# Patient Record
Sex: Female | Born: 1980 | Race: Black or African American | Hispanic: No | Marital: Married | Smoking: Current every day smoker
Health system: Southern US, Community
[De-identification: ages and names within clinical notes are randomized; demographics above are authoritative.]

## PROBLEM LIST (undated history)

## (undated) DIAGNOSIS — E039 Hypothyroidism, unspecified: Secondary | ICD-10-CM

## (undated) DIAGNOSIS — D500 Iron deficiency anemia secondary to blood loss (chronic): Secondary | ICD-10-CM

## (undated) DIAGNOSIS — K219 Gastro-esophageal reflux disease without esophagitis: Secondary | ICD-10-CM

## (undated) DIAGNOSIS — I10 Essential (primary) hypertension: Secondary | ICD-10-CM

## (undated) HISTORY — DX: Iron deficiency anemia secondary to blood loss (chronic): D50.0

---

## 2011-08-09 ENCOUNTER — Emergency Department (HOSPITAL_COMMUNITY): Payer: Self-pay

## 2011-08-09 ENCOUNTER — Emergency Department (HOSPITAL_COMMUNITY)
Admission: EM | Admit: 2011-08-09 | Discharge: 2011-08-09 | Disposition: A | Discharge status: Home / Self Care | Payer: Self-pay | Attending: Emergency Medicine | Admitting: Emergency Medicine

## 2011-08-09 DIAGNOSIS — R110 Nausea: Secondary | ICD-10-CM | POA: Insufficient documentation

## 2011-08-09 DIAGNOSIS — R51 Headache: Secondary | ICD-10-CM | POA: Insufficient documentation

## 2011-08-09 DIAGNOSIS — E039 Hypothyroidism, unspecified: Secondary | ICD-10-CM | POA: Insufficient documentation

## 2011-08-09 DIAGNOSIS — H53149 Visual discomfort, unspecified: Secondary | ICD-10-CM | POA: Insufficient documentation

## 2011-08-09 DIAGNOSIS — J3489 Other specified disorders of nose and nasal sinuses: Secondary | ICD-10-CM | POA: Insufficient documentation

## 2011-08-09 DIAGNOSIS — I10 Essential (primary) hypertension: Secondary | ICD-10-CM | POA: Insufficient documentation

## 2011-09-11 ENCOUNTER — Emergency Department (HOSPITAL_COMMUNITY)
Admission: EM | Admit: 2011-09-11 | Discharge: 2011-09-12 | Disposition: A | Discharge status: Home / Self Care | Payer: Self-pay | Attending: Emergency Medicine | Admitting: Emergency Medicine

## 2011-09-11 ENCOUNTER — Emergency Department (HOSPITAL_COMMUNITY): Payer: Self-pay

## 2011-09-11 DIAGNOSIS — K922 Gastrointestinal hemorrhage, unspecified: Secondary | ICD-10-CM | POA: Insufficient documentation

## 2011-09-11 DIAGNOSIS — R110 Nausea: Secondary | ICD-10-CM | POA: Insufficient documentation

## 2011-09-11 DIAGNOSIS — E039 Hypothyroidism, unspecified: Secondary | ICD-10-CM | POA: Insufficient documentation

## 2011-09-11 DIAGNOSIS — I10 Essential (primary) hypertension: Secondary | ICD-10-CM | POA: Insufficient documentation

## 2011-09-11 DIAGNOSIS — Z79899 Other long term (current) drug therapy: Secondary | ICD-10-CM | POA: Insufficient documentation

## 2011-09-11 DIAGNOSIS — R109 Unspecified abdominal pain: Secondary | ICD-10-CM | POA: Insufficient documentation

## 2011-09-11 DIAGNOSIS — K5730 Diverticulosis of large intestine without perforation or abscess without bleeding: Secondary | ICD-10-CM | POA: Insufficient documentation

## 2011-09-11 DIAGNOSIS — G43909 Migraine, unspecified, not intractable, without status migrainosus: Secondary | ICD-10-CM | POA: Insufficient documentation

## 2011-09-11 DIAGNOSIS — F17200 Nicotine dependence, unspecified, uncomplicated: Secondary | ICD-10-CM | POA: Insufficient documentation

## 2011-09-11 LAB — URINALYSIS, ROUTINE W REFLEX MICROSCOPIC
Bilirubin Urine: NEGATIVE
Glucose, UA: NEGATIVE mg/dL
Hgb urine dipstick: NEGATIVE
Ketones, ur: NEGATIVE mg/dL
Leukocytes, UA: NEGATIVE
Nitrite: NEGATIVE
Protein, ur: NEGATIVE mg/dL
Specific Gravity, Urine: 1.024 (ref 1.005–1.030)
Urobilinogen, UA: 0.2 mg/dL (ref 0.0–1.0)
pH: 5.5 (ref 5.0–8.0)

## 2011-09-11 LAB — POCT I-STAT, CHEM 8
BUN: 13 mg/dL (ref 6–23)
Calcium, Ion: 1.16 mmol/L (ref 1.12–1.32)
Chloride: 100 mEq/L (ref 96–112)
Creatinine, Ser: 0.9 mg/dL (ref 0.50–1.10)
Glucose, Bld: 94 mg/dL (ref 70–99)
HCT: 45 % (ref 36.0–46.0)
Hemoglobin: 15.3 g/dL — ABNORMAL HIGH (ref 12.0–15.0)
Potassium: 3.3 mEq/L — ABNORMAL LOW (ref 3.5–5.1)
Sodium: 137 mEq/L (ref 135–145)
TCO2: 26 mmol/L (ref 0–100)

## 2011-09-11 LAB — CBC
HCT: 39.3 % (ref 36.0–46.0)
Hemoglobin: 13.9 g/dL (ref 12.0–15.0)
MCH: 27.2 pg (ref 26.0–34.0)
MCHC: 35.4 g/dL (ref 30.0–36.0)
MCV: 76.9 fL — ABNORMAL LOW (ref 78.0–100.0)
Platelets: 299 10*3/uL (ref 150–400)
RBC: 5.11 MIL/uL (ref 3.87–5.11)
RDW: 13.9 % (ref 11.5–15.5)
WBC: 11.5 10*3/uL — ABNORMAL HIGH (ref 4.0–10.5)

## 2011-09-11 LAB — DIFFERENTIAL
Basophils Absolute: 0 10*3/uL (ref 0.0–0.1)
Basophils Relative: 0 % (ref 0–1)
Eosinophils Absolute: 0.2 10*3/uL (ref 0.0–0.7)
Eosinophils Relative: 1 % (ref 0–5)
Lymphocytes Relative: 46 % (ref 12–46)
Lymphs Abs: 5.3 10*3/uL — ABNORMAL HIGH (ref 0.7–4.0)
Monocytes Absolute: 0.7 10*3/uL (ref 0.1–1.0)
Monocytes Relative: 6 % (ref 3–12)
Neutro Abs: 5.3 10*3/uL (ref 1.7–7.7)
Neutrophils Relative %: 46 % (ref 43–77)

## 2011-09-11 LAB — OCCULT BLOOD, POC DEVICE: Fecal Occult Bld: NEGATIVE

## 2011-09-11 LAB — PREGNANCY, URINE: Preg Test, Ur: NEGATIVE

## 2011-09-12 MED ORDER — IOHEXOL 300 MG/ML  SOLN: 100.0000 mL | Freq: Once | INTRAMUSCULAR | Status: DC | PRN

## 2012-03-08 ENCOUNTER — Encounter (HOSPITAL_COMMUNITY): Payer: Self-pay | Admitting: Emergency Medicine

## 2012-03-08 ENCOUNTER — Emergency Department (HOSPITAL_COMMUNITY)
Admission: EM | Admit: 2012-03-08 | Discharge: 2012-03-09 | Disposition: A | Discharge status: Home / Self Care | Payer: Self-pay | Attending: Emergency Medicine | Admitting: Emergency Medicine

## 2012-03-08 ENCOUNTER — Emergency Department (HOSPITAL_COMMUNITY): Payer: Self-pay

## 2012-03-08 DIAGNOSIS — F17200 Nicotine dependence, unspecified, uncomplicated: Secondary | ICD-10-CM | POA: Insufficient documentation

## 2012-03-08 DIAGNOSIS — M79642 Pain in left hand: Secondary | ICD-10-CM

## 2012-03-08 DIAGNOSIS — I10 Essential (primary) hypertension: Secondary | ICD-10-CM | POA: Insufficient documentation

## 2012-03-08 DIAGNOSIS — M79609 Pain in unspecified limb: Secondary | ICD-10-CM | POA: Insufficient documentation

## 2012-03-08 HISTORY — DX: Essential (primary) hypertension: I10

## 2012-03-08 NOTE — ED Notes (Signed)
PT. REPORTS LEFT HAND PAIN WITH SWELLING ONSET YESTERDAY , DENIES INJURY OR FALL . 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

## 2012-03-09 ENCOUNTER — Encounter (HOSPITAL_COMMUNITY): Payer: Self-pay | Admitting: *Deleted

## 2012-03-09 MED ORDER — IBUPROFEN 800 MG PO TABS
800.0000 mg | ORAL_TABLET | Freq: Once | ORAL | Status: AC
  Administered 2012-03-09: 800 mg via ORAL
  Filled 2012-03-09: qty 1

## 2012-03-09 MED ORDER — BUTALBITAL-APAP-CAFFEINE 50-325-40 MG PO TABS: 1.0000 | ORAL_TABLET | Freq: Four times a day (QID) | ORAL | Status: DC | PRN

## 2012-03-09 NOTE — ED Notes (Signed)
Pt c/o pain and swelling to left hand since yesterday. 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 Denies known injury.

## 2012-03-09 NOTE — Discharge Instructions (Signed)
Rest, wear splint, and take medications as needed and as prescribed. Call your physician in [place] for followup in the clinic this week for recheck and further evaluation. Followup with orthopedic surgeon for persistent symptoms. Return here Her be evaluated sooner if you develop a fever or worsening condition despite medication and treatments.

## 2012-03-09 NOTE — ED Provider Notes (Signed)
History     CSN: [id number]  Arrival date & time 03/08/12  2309   First MD Initiated Contact with Patient 03/09/12 0040      Chief Complaint  Patient presents with  . Hand Pain    (Consider location/radiation/quality/duration/timing/severity/associated sxs/prior treatment) Patient is a 31 y.o. female presenting with hand pain. The history is provided by the patient.  Hand Pain This is a new problem. The current episode started 2 days ago. The problem occurs constantly. The problem has not changed since onset.Pertinent negatives include no chest pain, no headaches and no shortness of breath. Exacerbated by: Any kind of movement or activity. The symptoms are relieved by rest. She has tried nothing for the symptoms.   patient started a new job last week where she uses her hands and involves a lot of repetitive motion. She is right-handed and has no right hand pains or discomfort. Her left hand began bothering her a few days ago and is worse at work. She feels like it is swollen. No direct trauma. No rash. No skin breaks. No wrist pain. She does get some tingling in her fingers with bending her wrist. No fevers. No vomiting. No history of same. No recent illness otherwise. Moderate in severity. Pain described as soreness and there is no radiation. No weakness.  Past Medical History  Diagnosis Date  . Hypertension     History reviewed. No pertinent past surgical history.  History reviewed. No pertinent family history.  History  Substance Use Topics  . Smoking status: Current Everyday Smoker  . Smokeless tobacco: Not on file  . Alcohol Use: Yes    OB History    Grav Para Term Preterm Abortions TAB SAB Ect Mult Living                  Review of Systems  Constitutional: Negative for fever and chills.  HENT: Negative for neck pain and neck stiffness.   Eyes: Negative for pain.  Respiratory: Negative for shortness of breath.   Cardiovascular: Negative for chest pain.    Gastrointestinal: Negative for vomiting.  Genitourinary: Negative for dysuria.  Musculoskeletal: Negative for back pain.  Skin: Negative for rash and wound.  Neurological: Negative for headaches.  All other systems reviewed and are negative.    Allergies  Celebrex and Lisinopril  Home Medications   Current Outpatient Rx  Name Route Sig Dispense Refill  . EXCEDRIN PO Oral Take 2 tablets by mouth once.    Marland Kitchen HYDROCHLOROTHIAZIDE 12.5 MG PO CAPS Oral Take 12.5 mg by mouth every morning.    Marland Kitchen LEVOTHYROXINE SODIUM 100 MCG PO TABS Oral Take 100 mcg by mouth every morning.      BP 123/89  Pulse 72  Temp(Src) 97.4 F (36.3 C) (Oral)  Resp 18  SpO2 98%  LMP 03/02/2012  Physical Exam  Constitutional: She is oriented to person, place, and time. She appears well-developed and well-nourished.  HENT:  Head: Normocephalic and atraumatic.  Eyes: Conjunctivae and EOM are normal. Pupils are equal, round, and reactive to light.  Neck: Trachea normal. Neck supple. No thyromegaly present.  Cardiovascular: Normal rate, regular rhythm, S1 normal, S2 normal and normal pulses.     No systolic murmur is present   No diastolic murmur is present  Pulses:      Radial pulses are 2+ on the right side, and 2+ on the left side.  Pulmonary/Chest: Effort normal and breath sounds normal. She has no wheezes. She has no rhonchi. She  has no rales. She exhibits no tenderness.  Abdominal: Normal appearance. There is no CVA tenderness and negative Murphy's sign.  Musculoskeletal:       Left upper extremity: Localized hand discomfort she will palmar and dorsal aspect. There is no wrist tenderness with full range of motion. No anatomical snuffbox tenderness. No appreciable swelling. Positive Tinel's. No erythema or increased warmth to touch. Distal neurovascular intact with good cap refill. No skin breaks.  Neurological: She is alert and oriented to person, place, and time. She has normal strength. No cranial nerve  deficit or sensory deficit. GCS eye subscore is 4. GCS verbal subscore is 5. GCS motor subscore is 6.  Skin: Skin is warm and dry. No rash noted. She is not diaphoretic.  Psychiatric: Her speech is normal.       Cooperative and appropriate    ED Course  Procedures (including critical care time)  Labs Reviewed - No data to display Dg Hand Complete Left  03/08/2012  *RADIOLOGY REPORT*  Clinical Data: Left lateral hand swelling and pain, radiating to the first digit.  LEFT HAND - COMPLETE 3+ VIEW  Comparison: None.  Findings: There is no evidence of fracture or dislocation.  The joint spaces are preserved; the soft tissues are unremarkable in appearance.  The carpal rows are intact, and demonstrate normal alignment.  IMPRESSION: No evidence of fracture or dislocation.  Original Report Authenticated By: Tonia Ghent, M.D.    Gustavus Bryant. Splint. Pain medications.   MDM   Hand pain and possible carpal tunnel.  No evidence of joint infection, abscess or cellulitis otherwise. Plan NSAIDs and primary care followup. Orthopedics referral also provided, the patient lives in [place] prefers to see someone there.  Left wrist splint provided with prescriptions. Reliable historian verbalizes understanding hand pain precautions. She is stable for discharge home at this time. X-ray obtained and reviewed as above.        Sunnie Nielsen, MD 03/09/12 [phone]

## 2012-04-01 ENCOUNTER — Encounter (HOSPITAL_COMMUNITY): Payer: Self-pay | Admitting: Emergency Medicine

## 2012-04-01 ENCOUNTER — Emergency Department (HOSPITAL_COMMUNITY)
Admission: EM | Admit: 2012-04-01 | Discharge: 2012-04-01 | Disposition: A | Discharge status: Home / Self Care | Payer: Self-pay | Attending: Emergency Medicine | Admitting: Emergency Medicine

## 2012-04-01 DIAGNOSIS — M542 Cervicalgia: Secondary | ICD-10-CM | POA: Insufficient documentation

## 2012-04-01 DIAGNOSIS — I10 Essential (primary) hypertension: Secondary | ICD-10-CM | POA: Insufficient documentation

## 2012-04-01 DIAGNOSIS — E039 Hypothyroidism, unspecified: Secondary | ICD-10-CM | POA: Insufficient documentation

## 2012-04-01 DIAGNOSIS — F17200 Nicotine dependence, unspecified, uncomplicated: Secondary | ICD-10-CM | POA: Insufficient documentation

## 2012-04-01 DIAGNOSIS — M62838 Other muscle spasm: Secondary | ICD-10-CM

## 2012-04-01 HISTORY — DX: Hypothyroidism, unspecified: E03.9

## 2012-04-01 MED ORDER — HYDROCODONE-ACETAMINOPHEN 5-325 MG PO TABS: ORAL_TABLET | ORAL | Status: AC

## 2012-04-01 MED ORDER — METAXALONE 800 MG PO TABS: 800.0000 mg | ORAL_TABLET | Freq: Three times a day (TID) | ORAL | Status: AC

## 2012-04-01 NOTE — ED Provider Notes (Signed)
History     CSN: [id number]  Arrival date & time 04/01/12  1757   First MD Initiated Contact with Patient 04/01/12 1828      Chief Complaint  Patient presents with  . Neck Pain    HPI Pt was seen at 1835.  Per pt, c/o gradual onset and persistence of constant R>L neck "pain" since last night.  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  Denies injury, no tingling/numbness in extremities, no focal motor weakness, no fevers, no rash.     Past Medical History  Diagnosis Date  . Hypertension   . Hypothyroid     History reviewed. No pertinent past surgical history.   History  Substance Use Topics  . Smoking status: Current Everyday Smoker  . Smokeless tobacco: Not on file  . Alcohol Use: Yes    Review of Systems ROS: Statement: All systems negative except as marked or noted in the HPI; Constitutional: Negative for fever and chills. ; ; Eyes: Negative for eye pain, redness and discharge. ; ; ENMT: Negative for ear pain, hoarseness, nasal congestion, sinus pressure and sore throat. ; ; Cardiovascular: Negative for chest pain, palpitations, diaphoresis, dyspnea and peripheral edema. ; ; Respiratory: Negative for cough, wheezing and stridor. ; ; Gastrointestinal: Negative for nausea, vomiting, diarrhea, abdominal pain, blood in stool, hematemesis, jaundice and rectal bleeding. . ; ; Genitourinary: Negative for dysuria, flank pain and hematuria. ; ; Musculoskeletal: Negative for back pain and +neck pain. Negative for swelling and trauma.; ; Skin: Negative for pruritus, rash, abrasions, blisters, bruising and skin lesion.; ; Neuro: Negative for headache, lightheadedness and neck stiffness. Negative for weakness, altered level of consciousness , altered mental status, extremity weakness, paresthesias, involuntary movement, seizure and syncope.      Allergies  Celebrex and Lisinopril  Home Medications   Current Outpatient Rx  Name Route Sig  Dispense Refill  . HYDROCHLOROTHIAZIDE 12.5 MG PO CAPS Oral Take 12.5 mg by mouth every morning.    Marland Kitchen LEVOTHYROXINE SODIUM 100 MCG PO TABS Oral Take 100 mcg by mouth every morning.      BP 129/78  Pulse 90  Temp(Src) 97.8 F (36.6 C) (Oral)  Resp 16  SpO2 100%  LMP 03/02/2012  Physical Exam 1840: Physical examination:  Nursing notes reviewed; Vital signs and O2 SAT reviewed;  Constitutional: Well developed, Well nourished, Well hydrated, In no acute distress; Head:  Normocephalic, atraumatic; Eyes: EOMI, PERRL, No scleral icterus; ENMT: Mouth and pharynx normal, Mucous membranes moist; Neck: Supple, Full range of motion, No lymphadenopathy; Cardiovascular: Regular rate and rhythm, No murmur, rub, or gallop; Respiratory: Breath sounds clear & equal bilaterally, No rales, rhonchi, wheezes, or rub, Normal respiratory effort/excursion; Chest: Nontender, Movement normal; Spine:  No midline CS, TS, LS tenderness.  +TTP R>L hypertonic trapezius muscles which reproduces pt's pain.; Genitourinary: No CVA tenderness; Extremities: Pulses normal, No tenderness, No edema, No calf edema or asymmetry.; Neuro: AA&Ox3, Major CN grossly intact. Speech clear, no facial droop, gait steady. No gross focal motor or sensory deficits in extremities.; Skin: Color normal, Warm, Dry, no rash.    ED Course  Procedures   MDM  MDM Reviewed: nursing note and vitals       6:35 PM:  No midline CS tenderness, no neuro deficits.  Pain is reproducible when pt demonstrates the movements she performs at work.  Appears msk pain at this time, will tx symptomatically.       Laray Anger,  DO 04/01/12 2311

## 2012-04-01 NOTE — ED Notes (Signed)
C/o neck pain since last night.  No known injury.

## 2012-04-01 NOTE — Discharge Instructions (Signed)
RESOURCE GUIDE  Dental Problems  Patients with Medicaid: [facility]                     [facility] [address]                                           [address] Phone:  [phone]                                                  Phone:  [phone]  If unable to pay or uninsured, contact:  [facility] or [facility] to become qualified for the adult dental clinic.  Chronic Pain Problems Contact [facility]  [phone] Patients need to be referred by their primary care doctor.  Insufficient Money for Medicine Contact [employer]:  call "211" or [facility] 271-5999.  No Primary Care Doctor Call [facility]  [phone] Other agencies that provide inexpensive medical care    [facility]  [phone]    [facility]  [phone]    [facility]  [phone]    Women's Clinic  [phone]    [facility]  [phone]    [facility]  [phone]  Psychological Services [facility]  [phone] [facility]  [phone] [facility]   [phone] (emergency services [phone])  Substance Abuse Resources [facility]  336-882-2125 [facility] 336-784-9470 [facility] 336-285-9073 [facility] 336-845-3988 Residential & Outpatient Substance Abuse Program  800-659-3381  Abuse/Neglect [place] Child Abuse Hotline (336) 641-3795 [place] Child Abuse Hotline 800-378-5315 (After Hours)  Emergency Shelter [facility] (336) 271-5985  Maternity Homes [facility] (336) 275-9566 [facility] (704) 372-4663  MRSA Hotline #:   832-7006    [place] Resources  [facility] [place]     [employer]                          [facility] [address] [place]                       [address]      [address]  [place]                                                [place]                            [place] Phone:  [phone]                                   Phone:  [phone]                 Phone:  [phone]  [facility] Phone:  [phone]    [facility] Child Abuse Hotline 617-541-8087 640-549-2249 (After Hours)   Take the prescriptions as directed.  Apply moist heat or ice to the area(s) of discomfort, for 15 minutes at a time, several times per day for the next few days.  Do not fall asleep on a heating or ice pack.  Call your regular medical doctor on Monday to schedule a follow up appointment within the next week.  Return to the Emergency Department immediately if worsening.

## 2015-08-29 ENCOUNTER — Encounter (HOSPITAL_COMMUNITY): Payer: Self-pay | Admitting: Emergency Medicine

## 2015-08-29 ENCOUNTER — Emergency Department (HOSPITAL_COMMUNITY): Admission: EM | Admit: 2015-08-29 | Discharge: 2015-08-29 | Discharge status: Absent without leave | Payer: Self-pay

## 2015-08-29 ENCOUNTER — Emergency Department (HOSPITAL_COMMUNITY)
Admission: EM | Admit: 2015-08-29 | Discharge: 2015-08-30 | Disposition: A | Discharge status: Home / Self Care | Payer: BLUE CROSS/BLUE SHIELD | Attending: Emergency Medicine | Admitting: Emergency Medicine

## 2015-08-29 DIAGNOSIS — I10 Essential (primary) hypertension: Secondary | ICD-10-CM | POA: Diagnosis not present

## 2015-08-29 DIAGNOSIS — R1012 Left upper quadrant pain: Secondary | ICD-10-CM | POA: Insufficient documentation

## 2015-08-29 DIAGNOSIS — Z720 Tobacco use: Secondary | ICD-10-CM | POA: Diagnosis not present

## 2015-08-29 DIAGNOSIS — K625 Hemorrhage of anus and rectum: Secondary | ICD-10-CM

## 2015-08-29 DIAGNOSIS — R109 Unspecified abdominal pain: Secondary | ICD-10-CM

## 2015-08-29 DIAGNOSIS — R1032 Left lower quadrant pain: Secondary | ICD-10-CM | POA: Diagnosis not present

## 2015-08-29 DIAGNOSIS — R102 Pelvic and perineal pain: Secondary | ICD-10-CM | POA: Diagnosis not present

## 2015-08-29 DIAGNOSIS — E039 Hypothyroidism, unspecified: Secondary | ICD-10-CM | POA: Diagnosis not present

## 2015-08-29 HISTORY — DX: Gastro-esophageal reflux disease without esophagitis: K21.9

## 2015-08-29 LAB — COMPREHENSIVE METABOLIC PANEL
ALT: 35 U/L (ref 14–54)
AST: 31 U/L (ref 15–41)
Albumin: 3.8 g/dL (ref 3.5–5.0)
Alkaline Phosphatase: 93 U/L (ref 38–126)
Anion gap: 7 (ref 5–15)
BUN: 14 mg/dL (ref 6–20)
CO2: 27 mmol/L (ref 22–32)
Calcium: 8.3 mg/dL — ABNORMAL LOW (ref 8.9–10.3)
Chloride: 98 mmol/L — ABNORMAL LOW (ref 101–111)
Creatinine, Ser: 0.78 mg/dL (ref 0.44–1.00)
GFR calc Af Amer: >60 mL/min (ref >60)
GFR calc non Af Amer: >60 mL/min (ref >60)
Glucose, Bld: 105 mg/dL — ABNORMAL HIGH (ref 65–99)
Potassium: 3.1 mmol/L — ABNORMAL LOW (ref 3.5–5.1)
Sodium: 132 mmol/L — ABNORMAL LOW (ref 135–145)
Total Bilirubin: 0.3 mg/dL (ref 0.3–1.2)
Total Protein: 8 g/dL (ref 6.5–8.1)

## 2015-08-29 LAB — CBC WITH DIFFERENTIAL/PLATELET
Basophils Absolute: 0 10*3/uL (ref 0.0–0.1)
Basophils Relative: 0 %
Eosinophils Absolute: 0.1 10*3/uL (ref 0.0–0.7)
Eosinophils Relative: 1 %
HCT: 36.4 % (ref 36.0–46.0)
Hemoglobin: 12.1 g/dL (ref 12.0–15.0)
Lymphocytes Relative: 39 %
Lymphs Abs: 3.4 10*3/uL (ref 0.7–4.0)
MCH: 25.8 pg — ABNORMAL LOW (ref 26.0–34.0)
MCHC: 33.2 g/dL (ref 30.0–36.0)
MCV: 77.6 fL — ABNORMAL LOW (ref 78.0–100.0)
Monocytes Absolute: 0.5 10*3/uL (ref 0.1–1.0)
Monocytes Relative: 6 %
Neutro Abs: 4.8 10*3/uL (ref 1.7–7.7)
Neutrophils Relative %: 54 %
Platelets: 340 10*3/uL (ref 150–400)
RBC: 4.69 MIL/uL (ref 3.87–5.11)
RDW: 14.9 % (ref 11.5–15.5)
WBC: 8.9 10*3/uL (ref 4.0–10.5)

## 2015-08-29 NOTE — ED Notes (Signed)
Ambulatory to restroom without difficulty to obtain ua sample.

## 2015-08-29 NOTE — ED Notes (Signed)
Pt reports blood in stools x 1 week. C/O back, pelvic and chest pain.

## 2015-08-29 NOTE — ED Notes (Signed)
Patient states that she is having som chest discomfort at this time. Patient placed on cardiac monitor. Patient states that she just started her period also.

## 2015-08-29 NOTE — ED Notes (Signed)
Pt eating a bag of potato chips, no distress noted,

## 2015-08-30 LAB — POC OCCULT BLOOD, ED: Fecal Occult Bld: POSITIVE — AB

## 2015-08-30 MED ORDER — HYDROCORTISONE ACETATE 25 MG RE SUPP: 25.0000 mg | Freq: Two times a day (BID) | RECTAL | Status: DC

## 2015-08-30 MED ORDER — DICYCLOMINE HCL 20 MG PO TABS: 20.0000 mg | ORAL_TABLET | Freq: Three times a day (TID) | ORAL | Status: DC

## 2015-08-30 MED ORDER — METRONIDAZOLE 500 MG PO TABS: 500.0000 mg | ORAL_TABLET | Freq: Four times a day (QID) | ORAL | Status: DC

## 2015-08-30 MED ORDER — CIPROFLOXACIN HCL 250 MG PO TABS
500.0000 mg | ORAL_TABLET | Freq: Once | ORAL | Status: AC
  Administered 2015-08-30: 500 mg via ORAL
  Filled 2015-08-30: qty 2

## 2015-08-30 MED ORDER — CIPROFLOXACIN HCL 500 MG PO TABS: 500.0000 mg | ORAL_TABLET | Freq: Two times a day (BID) | ORAL | Status: DC

## 2015-08-30 MED ORDER — METRONIDAZOLE 500 MG PO TABS
500.0000 mg | ORAL_TABLET | Freq: Once | ORAL | Status: AC
  Administered 2015-08-30: 500 mg via ORAL
  Filled 2015-08-30: qty 1

## 2015-08-30 MED ORDER — DICYCLOMINE HCL 10 MG/ML IM SOLN
20.0000 mg | Freq: Once | INTRAMUSCULAR | Status: AC
  Administered 2015-08-30: 20 mg via INTRAMUSCULAR
  Filled 2015-08-30: qty 2

## 2015-08-30 NOTE — Discharge Instructions (Signed)
Drink plenty of fluids. Avoid fried, spicy or greasy foods. Look at the GERD diet information. Take the medications as prescribed. You can call Dr Roseanne Kaufman office for an appointment. He is a Copywriter, advertising.

## 2015-08-30 NOTE — ED Provider Notes (Signed)
CSN: [id number]     Arrival date & time 08/29/15  2005 History  This chart was scribed for No att. providers found by Terressa Koyanagi, ED Scribe. This patient was seen in room APA17/APA17 and the patient's care was started at 1:12 AM.   Chief Complaint  Patient presents with  . Rectal Bleeding   The history is provided by the patient. No language interpreter was used.   PCP: Anabel Bene  HPI Comments: Lynn Wilson is a 34 y.o. female, with PMHx noted below including diverticulosis, GERD, Gastritis, hiatal hernia and internal hemorrhoids, who presents to the Emergency Department complaining of chronic blood in stool (loose stool with "black clots") onset one week ago with associated intermittent LLQ pain, left upper quadrant pain, and pelvic pain. Pt notes she began her menstrual period recently. Pt reports for the past year she has blood in her stool when she starts her menstrual period. Pt reports she has been treated for similar Sx at other ED in [place] and reports 4 CAT Scans of abd since the beginning of the year. Pt further reports she is followed by GI in [place] and reports she had a endoscopy and colonoscopy done in December 2015 for the same symptoms she is having now. She was diagnosed with gastritis, internal hemorrhoids, GERD, hiatal hernia, and diverticulosis.. Pt denies n/v, fever, dizziness, light headedness, vaginal discharge or any other Sx at this time. She states she feels weak "from all the blood loss". Nothing she does makes the abdominal pain hurt more including standing walking and nothing she does makes the pain feel better.  PCP Dr Anabel Bene in [facility] in [place]  Past Medical History  Diagnosis Date  . Hypertension   . Hypothyroid   . GERD (gastroesophageal reflux disease)    History reviewed. No pertinent past surgical history. Family History  Problem Relation Age of Onset  . Hypertension Mother   . Hypertension Father    Social History   Substance Use Topics  . Smoking status: Current Every Day Smoker -- 0.50 packs/day    Types: Cigarettes  . Smokeless tobacco: Never Used  . Alcohol Use: Yes     Comment: occas   employe  OB History    No data available     Review of Systems  Constitutional: Negative for fever.  Gastrointestinal: Positive for abdominal pain and blood in stool.  Neurological: Negative for dizziness and light-headedness.  All other systems reviewed and are negative.  Allergies  Celebrex and Lisinopril  Home Medications   Amlodipine for BP GERD medication   Prior to Admission medications   Medication Sig Start Date End Date Taking? Authorizing Provider  ciprofloxacin (CIPRO) 500 MG tablet Take 1 tablet (500 mg total) by mouth 2 (two) times daily. 08/30/15   Rolland Porter, MD  dicyclomine (BENTYL) 20 MG tablet Take 1 tablet (20 mg total) by mouth 4 (four) times daily -  before meals and at bedtime. 08/30/15   Rolland Porter, MD  hydrochlorothiazide (MICROZIDE) 12.5 MG capsule Take 12.5 mg by mouth every morning.    Historical Provider, MD  hydrocortisone (ANUSOL-HC) 25 MG suppository Place 1 suppository (25 mg total) rectally 2 (two) times daily. 08/30/15   Rolland Porter, MD  levothyroxine (SYNTHROID, LEVOTHROID) 100 MCG tablet Take 100 mcg by mouth every morning.    Historical Provider, MD  metroNIDAZOLE (FLAGYL) 500 MG tablet Take 1 tablet (500 mg total) by mouth 4 (four) times daily. 08/30/15   Rolland Porter, MD  Triage Vitals: BP 121/90 mmHg  Pulse 100  Temp(Src) 98.3 F (36.8 C) (Oral)  Resp 24  Ht 5\' 5"  (1.651 m)  Wt 235 lb 2 oz (106.652 kg)  BMI 39.13 kg/m2  SpO2 100%  LMP 08/06/2015  Vital signs normal   Physical Exam  Constitutional: She is oriented to person, place, and time. She appears well-developed and well-nourished.  Non-toxic appearance. She does not appear ill. No distress.  HENT:  Head: Normocephalic and atraumatic.  Right Ear: External ear normal.  Left Ear: External ear normal.   Nose: Nose normal. No mucosal edema or rhinorrhea.  Mouth/Throat: Oropharynx is clear and moist and mucous membranes are normal. No dental abscesses or uvula swelling.  Eyes: Conjunctivae and EOM are normal. Pupils are equal, round, and reactive to light.  Neck: Normal range of motion and full passive range of motion without pain. Neck supple.  Cardiovascular: Normal rate, regular rhythm and normal heart sounds.  Exam reveals no gallop and no friction rub.   No murmur heard. Pulmonary/Chest: Effort normal and breath sounds normal. No respiratory distress. She has no wheezes. She has no rhonchi. She has no rales. She exhibits no tenderness and no crepitus.  Abdominal: Soft. Normal appearance and bowel sounds are normal. She exhibits no distension. There is tenderness (left sided ). There is no rebound and no guarding.    Musculoskeletal: Normal range of motion. She exhibits no edema or tenderness.  Moves all extremities well.   Neurological: She is alert and oriented to person, place, and time. She has normal strength. No cranial nerve deficit.  Skin: Skin is warm, dry and intact. No rash noted. No erythema. No pallor.  Psychiatric: She has a normal mood and affect. Her speech is normal and behavior is normal. Her mood appears not anxious.  Nursing note and vitals reviewed.  ED Course  Procedures (including critical care time)  Medications  ciprofloxacin (CIPRO) tablet 500 mg (500 mg Oral Given 08/30/15 0300)  metroNIDAZOLE (FLAGYL) tablet 500 mg (500 mg Oral Given 08/30/15 0300)  dicyclomine (BENTYL) injection 20 mg (20 mg Intramuscular Given 08/30/15 0301)    DIAGNOSTIC STUDIES: Oxygen Saturation is 100% on RA, nl by my interpretation.    COORDINATION OF CARE: 1:19 AM: Discussed treatment plan which includes antibiotics with pt at bedside; patient verbalizes understanding and agrees with treatment plan. Patient is requesting Bentyl for her pain which has helped in the past.  Records  were obtained from [facility]. Her last abdominal/pelvic CT scan was done for left lower quadrant pain on 01/02/2015. She was noted to have colonic diverticulosis without gross evidence of diverticulitis. She had no obstructive uropathy and she had a normal appendix.  Patient symptoms tonight are not new, they are chronic and she is artery been evaluated by gastroenterologist. She has known diverticulosis. I am going to treat her empirically with antibiotics and also treat her for internal hemorrhoids which may be the cause of her rectal bleeding. She is not anemic and does not need admission for transfusion. Her abdominal exam is not consistent with an abscess or perforation.  Labs Review Results for orders placed or performed during the hospital encounter of 08/29/15  CBC with Differential  Result Value Ref Range   WBC 8.9 4.0 - 10.5 K/uL   RBC 4.69 3.87 - 5.11 MIL/uL   Hemoglobin 12.1 12.0 - 15.0 g/dL   HCT 36.4 36.0 - 46.0 %   MCV 77.6 (L) 78.0 - 100.0 fL   MCH 25.8 (  L) 26.0 - 34.0 pg   MCHC 33.2 30.0 - 36.0 g/dL   RDW 14.9 11.5 - 15.5 %   Platelets 340 150 - 400 K/uL   Neutrophils Relative % 54 %   Neutro Abs 4.8 1.7 - 7.7 K/uL   Lymphocytes Relative 39 %   Lymphs Abs 3.4 0.7 - 4.0 K/uL   Monocytes Relative 6 %   Monocytes Absolute 0.5 0.1 - 1.0 K/uL   Eosinophils Relative 1 %   Eosinophils Absolute 0.1 0.0 - 0.7 K/uL   Basophils Relative 0 %   Basophils Absolute 0.0 0.0 - 0.1 K/uL  Comprehensive metabolic panel  Result Value Ref Range   Sodium 132 (L) 135 - 145 mmol/L   Potassium 3.1 (L) 3.5 - 5.1 mmol/L   Chloride 98 (L) 101 - 111 mmol/L   CO2 27 22 - 32 mmol/L   Glucose, Bld 105 (H) 65 - 99 mg/dL   BUN 14 6 - 20 mg/dL   Creatinine, Ser 0.78 0.44 - 1.00 mg/dL   Calcium 8.3 (L) 8.9 - 10.3 mg/dL   Total Protein 8.0 6.5 - 8.1 g/dL   Albumin 3.8 3.5 - 5.0 g/dL   AST 31 15 - 41 U/L   ALT 35 14 - 54 U/L   Alkaline Phosphatase 93 38 - 126 U/L   Total Bilirubin 0.3 0.3  - 1.2 mg/dL   GFR calc non Af Amer >60 >60 mL/min   GFR calc Af Amer >60 >60 mL/min   Anion gap 7 5 - 15  POC occult blood, ED RN will collect  Result Value Ref Range   Fecal Occult Bld POSITIVE (A) NEGATIVE   No results found.  I have personally reviewed and evaluated these lab results as part of my medical decision-making.   EKG Interpretation   Date/Time:  Thursday August 29 2015 20:32:47 EDT Ventricular Rate:  74 PR Interval:  168 QRS Duration: 96 QT Interval:  412 QTC Calculation: 457 R Axis:   18 Text Interpretation:  Normal sinus rhythm Normal ECG Confirmed by  ZACKOWSKI  MD, SCOTT ([id number]) on 08/29/2015 8:44:07 PM      MDM   Final diagnoses:  Abdominal pain, left lateral  PRB (rectal bleeding)    Discharge Medication List as of 08/30/2015  2:54 AM    START taking these medications   Details  ciprofloxacin (CIPRO) 500 MG tablet Take 1 tablet (500 mg total) by mouth 2 (two) times daily., Starting 08/30/2015, Until Discontinued, Print    dicyclomine (BENTYL) 20 MG tablet Take 1 tablet (20 mg total) by mouth 4 (four) times daily -  before meals and at bedtime., Starting 08/30/2015, Until Discontinued, Print    hydrocortisone (ANUSOL-HC) 25 MG suppository Place 1 suppository (25 mg total) rectally 2 (two) times daily., Starting 08/30/2015, Until Discontinued, Print    metroNIDAZOLE (FLAGYL) 500 MG tablet Take 1 tablet (500 mg total) by mouth 4 (four) times daily., Starting 08/30/2015, Until Discontinued, Print        Plan discharge  Rolland Porter, MD, FACEP   I personally performed the services described in this documentation, which was scribed in my presence. The recorded information has been reviewed and considered.  Rolland Porter, MD, Barbette Or, MD 08/30/15 [phone]

## 2015-08-30 NOTE — ED Notes (Signed)
Dr Knapp at bedside,  

## 2017-04-27 ENCOUNTER — Emergency Department: Payer: Self-pay

## 2017-04-27 ENCOUNTER — Encounter: Payer: Self-pay | Admitting: Emergency Medicine

## 2017-04-27 ENCOUNTER — Emergency Department
Admission: EM | Admit: 2017-04-27 | Discharge: 2017-04-27 | Disposition: A | Discharge status: Home / Self Care | Payer: Self-pay | Attending: Emergency Medicine | Admitting: Emergency Medicine

## 2017-04-27 DIAGNOSIS — F17210 Nicotine dependence, cigarettes, uncomplicated: Secondary | ICD-10-CM | POA: Insufficient documentation

## 2017-04-27 DIAGNOSIS — I10 Essential (primary) hypertension: Secondary | ICD-10-CM | POA: Insufficient documentation

## 2017-04-27 DIAGNOSIS — Z79899 Other long term (current) drug therapy: Secondary | ICD-10-CM | POA: Insufficient documentation

## 2017-04-27 DIAGNOSIS — R1010 Upper abdominal pain, unspecified: Secondary | ICD-10-CM | POA: Insufficient documentation

## 2017-04-27 DIAGNOSIS — B9789 Other viral agents as the cause of diseases classified elsewhere: Secondary | ICD-10-CM

## 2017-04-27 DIAGNOSIS — J069 Acute upper respiratory infection, unspecified: Secondary | ICD-10-CM | POA: Insufficient documentation

## 2017-04-27 DIAGNOSIS — R945 Abnormal results of liver function studies: Secondary | ICD-10-CM

## 2017-04-27 DIAGNOSIS — R7989 Other specified abnormal findings of blood chemistry: Secondary | ICD-10-CM | POA: Insufficient documentation

## 2017-04-27 LAB — COMPREHENSIVE METABOLIC PANEL
ALT: 195 U/L — ABNORMAL HIGH (ref 14–54)
AST: 368 U/L — ABNORMAL HIGH (ref 15–41)
Albumin: 3.9 g/dL (ref 3.5–5.0)
Alkaline Phosphatase: 182 U/L — ABNORMAL HIGH (ref 38–126)
Anion gap: 7 (ref 5–15)
BUN: 9 mg/dL (ref 6–20)
CO2: 22 mmol/L (ref 22–32)
Calcium: 8.6 mg/dL — ABNORMAL LOW (ref 8.9–10.3)
Chloride: 107 mmol/L (ref 101–111)
Creatinine, Ser: 0.73 mg/dL (ref 0.44–1.00)
GFR calc Af Amer: >60 mL/min (ref >60)
GFR calc non Af Amer: >60 mL/min (ref >60)
Glucose, Bld: 92 mg/dL (ref 65–99)
Potassium: 3.8 mmol/L (ref 3.5–5.1)
Sodium: 136 mmol/L (ref 135–145)
Total Bilirubin: 0.6 mg/dL (ref 0.3–1.2)
Total Protein: 8.1 g/dL (ref 6.5–8.1)

## 2017-04-27 LAB — CBC
HCT: 31.6 % — ABNORMAL LOW (ref 35.0–47.0)
Hemoglobin: 9.9 g/dL — ABNORMAL LOW (ref 12.0–16.0)
MCH: 19.6 pg — ABNORMAL LOW (ref 26.0–34.0)
MCHC: 31.3 g/dL — ABNORMAL LOW (ref 32.0–36.0)
MCV: 62.7 fL — ABNORMAL LOW (ref 80.0–100.0)
Platelets: 425 10*3/uL (ref 150–440)
RBC: 5.05 MIL/uL (ref 3.80–5.20)
RDW: 21.3 % — ABNORMAL HIGH (ref 11.5–14.5)
WBC: 5.8 10*3/uL (ref 3.6–11.0)

## 2017-04-27 LAB — LIPASE, BLOOD: Lipase: 20 U/L (ref 11–51)

## 2017-04-27 MED ORDER — HYDROCODONE-HOMATROPINE 5-1.5 MG/5ML PO SYRP: 5.0000 mL | ORAL_SOLUTION | Freq: Four times a day (QID) | ORAL | 0 refills | Status: DC | PRN

## 2017-04-27 MED ORDER — ONDANSETRON 4 MG PO TBDP: ORAL_TABLET | ORAL | 0 refills | Status: DC

## 2017-04-27 NOTE — Discharge Instructions (Signed)
Your workup regarding your cough was reassuring with no sign of pneumonia.  Please use the prescribed cough syrup as recommended and follow up with your regular doctor.  As we discussed, though, you have some lab test abnormalities regarding your liver.  Your ultrasound did not reveal a cause, but it is important that you follow up with a GI specialist such as Dr. Vicente Males or one of his colleagues.  Please refer to the included follow up contact information and call tomorrow for the next available appointment.  Return to the emergency department if you develop new or worsening symptoms that concern you.

## 2017-04-27 NOTE — ED Notes (Signed)

## 2017-04-27 NOTE — ED Provider Notes (Signed)
[facility] Emergency Department Provider Note  ____________________________________________   First MD Initiated Contact with Patient 04/27/17 1859     (approximate)  I have reviewed the triage vital signs and the nursing notes.   HISTORY  Chief Complaint Cough    HPI Lynn Wilson is a 36 y.o. female who presents for evaluationof approximately one week of persistent cough occasionally productive of green sputum.  She states that the symptoms started as a cold but she is concerned because they have persisted after a week.  She denies shortness of breath, chest pain, fever/chills.  She does smoke daily.  She has been given an albuterol inhaler in the past for her symptoms although she has no diagnosis of asthma or COPD.  She does not currently have a local PCP. she describes her cough as moderate and nothing makes it better nor worse.  she also presents with concerns for acute onset upper abdominal and epigastric pain as stated with nausea.  No vomiting.  No diarrhea.  She states that the symptoms seem to be worse after she eats.  She has never had any abdominal surgery and has not had these symptoms before.  She describes them as moderate, sharp and aching.   Past Medical History:  Diagnosis Date  . GERD (gastroesophageal reflux disease)   . Hypertension   . Hypothyroid     There are no active problems to display for this patient.   History reviewed. No pertinent surgical history.  Prior to Admission medications   Medication Sig Start Date End Date Taking? Authorizing Provider  ciprofloxacin (CIPRO) 500 MG tablet Take 1 tablet (500 mg total) by mouth 2 (two) times daily. 08/30/15   Rolland Porter, MD  dicyclomine (BENTYL) 20 MG tablet Take 1 tablet (20 mg total) by mouth 4 (four) times daily -  before meals and at bedtime. 08/30/15   Rolland Porter, MD  hydrochlorothiazide (MICROZIDE) 12.5 MG capsule Take 12.5 mg by mouth every morning.    [provider]  HYDROcodone-homatropine (HYCODAN) 5-1.5 MG/5ML syrup Take 5 mLs by mouth every 6 (six) hours as needed for cough. 04/27/17   Hinda Kehr, MD  hydrocortisone (ANUSOL-HC) 25 MG suppository Place 1 suppository (25 mg total) rectally 2 (two) times daily. 08/30/15   Rolland Porter, MD  levothyroxine (SYNTHROID, LEVOTHROID) 100 MCG tablet Take 100 mcg by mouth every morning.    [provider]  metroNIDAZOLE (FLAGYL) 500 MG tablet Take 1 tablet (500 mg total) by mouth 4 (four) times daily. 08/30/15   Rolland Porter, MD  ondansetron (ZOFRAN ODT) 4 MG disintegrating tablet Allow 1-2 tablets to dissolve in your mouth every 8 hours as needed for nausea/vomiting 04/27/17   Hinda Kehr, MD    Allergies Celebrex [celecoxib] and Lisinopril  Family History  Problem Relation Age of Onset  . Hypertension Mother   . Hypertension Father     Social History Social History  Substance Use Topics  . Smoking status: Current Every Day Smoker    Packs/day: 0.50    Types: Cigarettes  . Smokeless tobacco: Never Used  . Alcohol use Yes     Comment: occas    Review of Systems Constitutional: No fever/chills Eyes: No visual changes. ENT: No sore throat. Cardiovascular: Denies chest pain. Respiratory: Denies shortness of breath. productive cough 1 week Gastrointestinal: upper abdominal pain x 1 day.  nausea, no vomiting.  No diarrhea.  No constipation. Genitourinary: Negative for dysuria. Musculoskeletal: Negative for back pain. Integumentary: Negative  for rash. Neurological: Negative for headaches, focal weakness or numbness.   ____________________________________________   PHYSICAL EXAM:  VITAL SIGNS: ED Triage Vitals  Enc Vitals Group     BP 04/27/17 1614 (!) 146/86     Pulse Rate 04/27/17 1614 84     Resp 04/27/17 1614 18     Temp 04/27/17 1614 98.9 F (37.2 C)     Temp Source 04/27/17 1614 Oral     SpO2 04/27/17 1614 97 %     Weight 04/27/17 1611 225 lb (102.1 kg)      Height 04/27/17 1611 5\' 4"  (1.626 m)     Head Circumference --      Peak Flow --      Pain Score 04/27/17 1610 8     Pain Loc --      Pain Edu? --      Excl. in [place]? --     Constitutional: Alert and oriented. Well appearing and in no acute distress. Eyes: Conjunctivae are normal. PERRL. EOMI. Head: Atraumatic. Nose: No congestion/rhinnorhea. Mouth/Throat: Mucous membranes are moist. Neck: No stridor.  No meningeal signs.   Cardiovascular: Normal rate, regular rhythm. Good peripheral circulation. Grossly normal heart sounds. Respiratory: Normal respiratory effort.  No retractions. Lungs CTAB. Gastrointestinal: Soft with moderate tenderness to palpation in the right upper quadrant and epigastrium with positive Murphy sign.  no lower abdominal tenderness Musculoskeletal: No lower extremity tenderness nor edema. No gross deformities of extremities. Neurologic:  Normal speech and language. No gross focal neurologic deficits are appreciated.  Skin:  Skin is warm, dry and intact. No rash noted. Psychiatric: Mood and affect are normal. Speech and behavior are normal.  ____________________________________________   LABS (all labs ordered are listed, but only abnormal results are displayed)  Labs Reviewed  COMPREHENSIVE METABOLIC PANEL - Abnormal; Notable for the following:       Result Value   Calcium 8.6 (*)    AST 368 (*)    ALT 195 (*)    Alkaline Phosphatase 182 (*)    All other components within normal limits  CBC - Abnormal; Notable for the following:    Hemoglobin 9.9 (*)    HCT 31.6 (*)    MCV 62.7 (*)    MCH 19.6 (*)    MCHC 31.3 (*)    RDW 21.3 (*)    All other components within normal limits  LIPASE, BLOOD  URINALYSIS, COMPLETE (UACMP) WITH MICROSCOPIC  HEPATITIS PANEL, ACUTE  POC URINE PREG, ED   ____________________________________________  EKG  ED ECG REPORT I, Leandra Vanderweele, the attending physician, personally viewed and interpreted this ECG.  Date:  04/27/2017 EKG Time: 16:14 Rate: 83 Rhythm: normal sinus rhythm QRS Axis: normal Intervals: normal ST/T Wave abnormalities: inverted T-wave in lead 3 Conduction Disturbances: none Narrative Interpretation: unremarkable  ____________________________________________  RADIOLOGY   Dg Chest 2 View  Result Date: 04/27/2017 CLINICAL DATA:  Cough and pain EXAM: CHEST  2 VIEW COMPARISON:  March 11, 2015 FINDINGS: Lungs are clear. Heart size and pulmonary vascularity are normal. No adenopathy. No pneumothorax. No bone lesions. IMPRESSION: No edema or consolidation. Electronically Signed   By: Lowella Grip III M.D.   On: 04/27/2017 16:50   US Abdomen Limited Ruq  Result Date: 04/27/2017 CLINICAL DATA:  Right upper quadrant and epigastric pain, worse after meals. EXAM: US ABDOMEN LIMITED - RIGHT UPPER QUADRANT COMPARISON:  None. FINDINGS: Gallbladder: The gallbladder is poorly distended limiting evaluation. No stones, sludge, wall thickening, or pericholecystic fluid. Common bile  duct: Diameter: 3.3 mm Liver: No focal lesion identified. Within normal limits in parenchymal echogenicity. IMPRESSION: 1. The gallbladder is poorly distended limiting evaluation. No gallbladder abnormalities identified. The remainder of the study is normal. Electronically Signed   By: Dorise Bullion III M.D   On: 04/27/2017 20:03    ____________________________________________   PROCEDURES  Critical Care performed: No   Procedure(s) performed:   Procedures   ____________________________________________   INITIAL IMPRESSION / ASSESSMENT AND PLAN / ED COURSE  Pertinent labs & imaging results that were available during my care of the patient were reviewed by me and considered in my medical decision making (see chart for details).  No evidence of any acute pulmonary infection.  Given the patient has right upper quadrant tenderness to palpation, I ordered an ultrasound of the gallbladder.  Gallbladder  looked normal but the exam was somewhat limited.  However somewhat more concerning was the fact that the patient's LFTs are elevated but with a normal bilirubin.  I have paged Dr. Vicente Males to discuss.  The patient is ambulatory and in no acute distress at this time.   Clinical Course as of Apr 27 2058  Tue Apr 27, 2017  2028 Awaiting callback from Dr. Vicente Males to discuss patient's LFTs.  [CF]  2050 I have paged Dr. Vicente Males twice over about an hour and he has not called back.  The patient wants to leave.  I told her about her elevated liver function tests and that I cannot explain them but that she needs to follow up with the GI specialist.  She prefers to go home and contact Dr. Vicente Males as an outpatient.  If he does call back I will update him about the patient and I will send a message to [facility] to inform him about her and ask him to reach up to her if possible.  I gave my usual and customary return precautions.     [CF]    Clinical Course User Index [CF] Hinda Kehr, MD    ____________________________________________  FINAL CLINICAL IMPRESSION(S) / ED DIAGNOSES  Final diagnoses:  Viral URI with cough  Elevated LFTs  Upper abdominal pain     MEDICATIONS GIVEN DURING THIS VISIT:  Medications - No data to display   NEW OUTPATIENT MEDICATIONS STARTED DURING THIS VISIT:  New Prescriptions   HYDROCODONE-HOMATROPINE (HYCODAN) 5-1.5 MG/5ML SYRUP    Take 5 mLs by mouth every 6 (six) hours as needed for cough.   ONDANSETRON (ZOFRAN ODT) 4 MG DISINTEGRATING TABLET    Allow 1-2 tablets to dissolve in your mouth every 8 hours as needed for nausea/vomiting    Modified Medications   No medications on file    Discontinued Medications   No medications on file     Note:  This document was prepared using Dragon voice recognition software and may include unintentional dictation errors.    Hinda Kehr, MD 04/27/17 2059

## 2017-04-27 NOTE — ED Triage Notes (Signed)
Pt c/o cough for 1 week; green and productive.  Pt also c/o epigastric/upper abdominal pain/cramping that started today.  Has had nausea.  Denies vomiting or diarrhea.  Denies fevers. Ambulatory to triage. NAD. VSS

## 2017-04-28 ENCOUNTER — Telehealth: Payer: Self-pay | Admitting: Gastroenterology

## 2017-04-28 ENCOUNTER — Ambulatory Visit: Payer: BLUE CROSS/BLUE SHIELD | Admitting: Gastroenterology

## 2017-04-28 ENCOUNTER — Encounter: Payer: Self-pay | Admitting: Gastroenterology

## 2017-04-28 NOTE — Progress Notes (Deleted)
ER referral for abnormal LFT's  She presented to the ER yesterday with a 1 week history of cough and green sputum as her symptoms persisted beyond a week. She also had upper abdominal pain worse after meals . During her evaluation she was noted to have elevated transaminases , microcytic anemia . RUQ USG showed no gall stones , CBD 3.3 mm . She was discharged to follow up with me.   Yesterday - acute hepatitis panel was ordered   Here referred by the ER when she presented to them with persistent cough, phlegm of a weeks duration and was found to have incidentally abnormal LFT's.   Impression 1. Abnormal LFT's- possible viral etiology ?EBV causing URI symptoms as well as abnormal LFT's expect resolution soon  2. Abdominal pain likely secondary to viral syndrome/LRTI- if no better will need EGD and CT scan  3. Microcytic anemia ****   Plan  1. Iron studies ferritin , b12,folate ,tsh 2. Urine analysis 3. Celiac serology  4. GGT,CK,EBV,CMV,HSV,EZV serology Hep B/C viral load, HIV,acetaminophen levels, drug urine screen ,INR 5. LFT,INR in 5 days on Monday  6. IF she turns yellow to come to ER and if she is confused  7. No alcohol, herbal supplements 8. Pregnancy test  9. If she has iron deficiecy anemia *** 10. H pylori stool antigen

## 2017-04-28 NOTE — Telephone Encounter (Signed)
ER called to get patient scheduled. I called and spoke to patient and put her in at 1:15 today and gave her directions on how to get to the office. She was a no show. I called and left a voice message for her to call and reschedule and sent her a letter.

## 2017-04-29 LAB — HEPATITIS PANEL, ACUTE
HCV Ab: <0.1 s/co ratio (ref 0.0–0.9)
Hep A IgM: NEGATIVE
Hep B C IgM: NEGATIVE
Hepatitis B Surface Ag: NEGATIVE

## 2017-07-04 ENCOUNTER — Encounter: Payer: Self-pay | Admitting: Emergency Medicine

## 2017-07-04 ENCOUNTER — Emergency Department
Admission: EM | Admit: 2017-07-04 | Discharge: 2017-07-04 | Disposition: A | Discharge status: Home / Self Care | Payer: BLUE CROSS/BLUE SHIELD | Attending: Emergency Medicine | Admitting: Emergency Medicine

## 2017-07-04 DIAGNOSIS — R1012 Left upper quadrant pain: Secondary | ICD-10-CM | POA: Insufficient documentation

## 2017-07-04 DIAGNOSIS — Z5321 Procedure and treatment not carried out due to patient leaving prior to being seen by health care provider: Secondary | ICD-10-CM | POA: Insufficient documentation

## 2017-07-04 DIAGNOSIS — K6289 Other specified diseases of anus and rectum: Secondary | ICD-10-CM | POA: Insufficient documentation

## 2017-07-04 DIAGNOSIS — R1111 Vomiting without nausea: Secondary | ICD-10-CM | POA: Insufficient documentation

## 2017-07-04 LAB — CBC
HCT: 32.3 % — ABNORMAL LOW (ref 35.0–47.0)
Hemoglobin: 10.4 g/dL — ABNORMAL LOW (ref 12.0–16.0)
MCH: 20.6 pg — ABNORMAL LOW (ref 26.0–34.0)
MCHC: 32.3 g/dL (ref 32.0–36.0)
MCV: 63.7 fL — ABNORMAL LOW (ref 80.0–100.0)
Platelets: 367 10*3/uL (ref 150–440)
RBC: 5.07 MIL/uL (ref 3.80–5.20)
RDW: 21.5 % — ABNORMAL HIGH (ref 11.5–14.5)
WBC: 8.1 10*3/uL (ref 3.6–11.0)

## 2017-07-04 LAB — URINALYSIS, COMPLETE (UACMP) WITH MICROSCOPIC
Bacteria, UA: NONE SEEN
Bilirubin Urine: NEGATIVE
Glucose, UA: NEGATIVE mg/dL
Hgb urine dipstick: NEGATIVE
Ketones, ur: NEGATIVE mg/dL
Leukocytes, UA: NEGATIVE
Nitrite: NEGATIVE
Protein, ur: NEGATIVE mg/dL
RBC / HPF: NONE SEEN RBC/hpf (ref 0–5)
Specific Gravity, Urine: 1.025 (ref 1.005–1.030)
pH: 5 (ref 5.0–8.0)

## 2017-07-04 LAB — COMPREHENSIVE METABOLIC PANEL
ALT: 39 U/L (ref 14–54)
AST: 42 U/L — ABNORMAL HIGH (ref 15–41)
Albumin: 4.1 g/dL (ref 3.5–5.0)
Alkaline Phosphatase: 90 U/L (ref 38–126)
Anion gap: 9 (ref 5–15)
BUN: 13 mg/dL (ref 6–20)
CO2: 23 mmol/L (ref 22–32)
Calcium: 9 mg/dL (ref 8.9–10.3)
Chloride: 105 mmol/L (ref 101–111)
Creatinine, Ser: 0.78 mg/dL (ref 0.44–1.00)
GFR calc Af Amer: >60 mL/min (ref >60)
GFR calc non Af Amer: >60 mL/min (ref >60)
Glucose, Bld: 92 mg/dL (ref 65–99)
Potassium: 3.7 mmol/L (ref 3.5–5.1)
Sodium: 137 mmol/L (ref 135–145)
Total Bilirubin: 0.4 mg/dL (ref 0.3–1.2)
Total Protein: 8.3 g/dL — ABNORMAL HIGH (ref 6.5–8.1)

## 2017-07-04 LAB — LIPASE, BLOOD: Lipase: 27 U/L (ref 11–51)

## 2017-07-04 LAB — POCT PREGNANCY, URINE: Preg Test, Ur: NEGATIVE

## 2017-07-04 NOTE — ED Triage Notes (Signed)
Pt reports LUQ pain started last night reports nausea no vomiting, reports achy, throbbing pain to rectum reports woke her from her sleep, history of hemorrhoids reports pain is different, last BM today reports was normal

## 2017-07-05 ENCOUNTER — Telehealth: Payer: Self-pay | Admitting: Emergency Medicine

## 2017-07-05 NOTE — Telephone Encounter (Signed)
Called patient due to lwot to inquire about condition and follow up plans. Left message.   

## 2017-08-28 ENCOUNTER — Emergency Department
Admission: EM | Admit: 2017-08-28 | Discharge: 2017-08-28 | Disposition: A | Discharge status: Home / Self Care | Payer: BLUE CROSS/BLUE SHIELD | Attending: Student in an Organized Health Care Education/Training Program | Admitting: Student in an Organized Health Care Education/Training Program

## 2017-08-28 ENCOUNTER — Encounter: Payer: Self-pay | Admitting: Emergency Medicine

## 2017-08-28 DIAGNOSIS — F17210 Nicotine dependence, cigarettes, uncomplicated: Secondary | ICD-10-CM | POA: Insufficient documentation

## 2017-08-28 DIAGNOSIS — E039 Hypothyroidism, unspecified: Secondary | ICD-10-CM | POA: Insufficient documentation

## 2017-08-28 DIAGNOSIS — Z79899 Other long term (current) drug therapy: Secondary | ICD-10-CM | POA: Insufficient documentation

## 2017-08-28 DIAGNOSIS — I10 Essential (primary) hypertension: Secondary | ICD-10-CM | POA: Insufficient documentation

## 2017-08-28 DIAGNOSIS — J0100 Acute maxillary sinusitis, unspecified: Secondary | ICD-10-CM

## 2017-08-28 MED ORDER — AMOXICILLIN-POT CLAVULANATE 875-125 MG PO TABS: 1.0000 | ORAL_TABLET | Freq: Two times a day (BID) | ORAL | 0 refills | Status: DC

## 2017-08-28 MED ORDER — IBUPROFEN 600 MG PO TABS: 600.0000 mg | ORAL_TABLET | Freq: Once | ORAL | Status: DC

## 2017-08-28 MED ORDER — IBUPROFEN 600 MG PO TABS
600.0000 mg | ORAL_TABLET | Freq: Once | ORAL | Status: AC
  Administered 2017-08-28: 600 mg via ORAL
  Filled 2017-08-28: qty 1

## 2017-08-28 MED ORDER — PSEUDOEPH-BROMPHEN-DM 30-2-10 MG/5ML PO SYRP: 10.0000 mL | ORAL_SOLUTION | Freq: Four times a day (QID) | ORAL | 0 refills | Status: DC | PRN

## 2017-08-28 MED ORDER — FLUTICASONE PROPIONATE 50 MCG/ACT NA SUSP: 1.0000 | Freq: Two times a day (BID) | NASAL | 0 refills | Status: DC

## 2017-08-28 MED ORDER — DIPHENHYDRAMINE HCL 50 MG/ML IJ SOLN
50.0000 mg | Freq: Once | INTRAMUSCULAR | Status: AC
  Administered 2017-08-28: 50 mg via INTRAMUSCULAR
  Filled 2017-08-28: qty 1

## 2017-08-28 NOTE — ED Triage Notes (Signed)
Pt arrives ambulatory to triage with c/o generalized body aches and fever. Pt denies medication for fever since AM. Pt reports that her aches and pain started a couple of days ago with today being worse. Pt is in NAD at this time.

## 2017-08-28 NOTE — ED Provider Notes (Signed)
[facility] Emergency Department Provider Note  ____________________________________________  Time seen: Approximately 8:55 PM  I have reviewed the triage vital signs and the nursing notes.   HISTORY  Chief Complaint Fever    HPI Lynn Wilson is a 36 y.o. female who presents emergency department complaining of nasal congestion, scratchy throat, ear pressure, fevers and chills, cough 4 days. Patient reports that symptoms began with sinus pressure and nasal congestion and increased to include all the above. Patient denies any headache, visual changes, chest pain, shortness of breath, abdominal pain, nausea vomiting, diarrhea or constipation. No medications prior to arrival. No other complaints at this time.   Past Medical History:  Diagnosis Date  . GERD (gastroesophageal reflux disease)   . Hypertension   . Hypothyroid     There are no active problems to display for this patient.   History reviewed. No pertinent surgical history.  Prior to Admission medications   Medication Sig Start Date End Date Taking? Authorizing Provider  amoxicillin-clavulanate (AUGMENTIN) 875-125 MG tablet Take 1 tablet by mouth 2 (two) times daily. 08/28/17   Naleyah Ohlinger, Charline Bills, PA-C  brompheniramine-pseudoephedrine-DM 30-2-10 MG/5ML syrup Take 10 mLs by mouth 4 (four) times daily as needed. 08/28/17   Lionardo Haze, Charline Bills, PA-C  ciprofloxacin (CIPRO) 500 MG tablet Take 1 tablet (500 mg total) by mouth 2 (two) times daily. 08/30/15   Rolland Porter, MD  dicyclomine (BENTYL) 20 MG tablet Take 1 tablet (20 mg total) by mouth 4 (four) times daily -  before meals and at bedtime. 08/30/15   Rolland Porter, MD  fluticasone (FLONASE) 50 MCG/ACT nasal spray Place 1 spray into both nostrils 2 (two) times daily. 08/28/17   Makael Stein, Charline Bills, PA-C  hydrochlorothiazide (MICROZIDE) 12.5 MG capsule Take 12.5 mg by mouth every morning.    [provider]  HYDROcodone-homatropine  (HYCODAN) 5-1.5 MG/5ML syrup Take 5 mLs by mouth every 6 (six) hours as needed for cough. 04/27/17   Hinda Kehr, MD  hydrocortisone (ANUSOL-HC) 25 MG suppository Place 1 suppository (25 mg total) rectally 2 (two) times daily. 08/30/15   Rolland Porter, MD  levothyroxine (SYNTHROID, LEVOTHROID) 100 MCG tablet Take 100 mcg by mouth every morning.    [provider]  metroNIDAZOLE (FLAGYL) 500 MG tablet Take 1 tablet (500 mg total) by mouth 4 (four) times daily. 08/30/15   Rolland Porter, MD  ondansetron (ZOFRAN ODT) 4 MG disintegrating tablet Allow 1-2 tablets to dissolve in your mouth every 8 hours as needed for nausea/vomiting 04/27/17   Hinda Kehr, MD    Allergies Celebrex [celecoxib] and Lisinopril  Family History  Problem Relation Age of Onset  . Hypertension Mother   . Hypertension Father     Social History Social History  Substance Use Topics  . Smoking status: Current Every Day Smoker    Packs/day: 0.50    Types: Cigarettes  . Smokeless tobacco: Never Used  . Alcohol use Yes     Comment: occas     Review of Systems  Constitutional: Positive fever/chills Eyes: No visual changes. No discharge ENT: Positive for nasal congestion, sinus pressure, ear pressure, scratchy throat. Cardiovascular: no chest pain. Respiratory: Positive cough. No SOB. Gastrointestinal: No abdominal pain.  No nausea, no vomiting.  No diarrhea.  No constipation. Musculoskeletal: Negative for musculoskeletal pain. Skin: Negative for rash, abrasions, lacerations, ecchymosis. Neurological: Negative for headaches, focal weakness or numbness. 10-point ROS otherwise negative.  ____________________________________________   PHYSICAL EXAM:  VITAL SIGNS: ED Triage Vitals  Enc  Vitals Group     BP 08/28/17 2008 (!) 153/91     Pulse Rate 08/28/17 2008 85     Resp 08/28/17 2008 18     Temp 08/28/17 2008 98 F (36.7 C)     Temp Source 08/28/17 2008 Oral     SpO2 08/28/17 2008 100 %     Weight  08/28/17 2010 235 lb (106.6 kg)     Height 08/28/17 2010 5\' 5"  (1.651 m)     Head Circumference --      Peak Flow --      Pain Score 08/28/17 2015 7     Pain Loc --      Pain Edu? --      Excl. in [place]? --      Constitutional: Alert and oriented. Well appearing and in no acute distress. Eyes: Conjunctivae are normal. PERRL. EOMI. Head: Atraumatic. ENT:      Ears: EACs are unremarkable bilaterally. TMs are mildly bulging bilaterally.      Nose: Significant purulent congestion/rhinnorhea. Patient is tender percussion over maxillary sinuses.      Mouth/Throat: Mucous membranes are moist. Oropharynx is nonerythematous and nonedematous. Tonsils are unremarkable. Uvula is midline. Neck: No stridor.    Cardiovascular: Normal rate, regular rhythm. Normal S1 and S2.  Good peripheral circulation. Respiratory: Normal respiratory effort without tachypnea or retractions. Lungs CTAB. Good air entry to the bases with no decreased or absent breath sounds. Musculoskeletal: Full range of motion to all extremities. No gross deformities appreciated. Neurologic:  Normal speech and language. No gross focal neurologic deficits are appreciated.  Skin:  Skin is warm, dry and intact. No rash noted. Psychiatric: Mood and affect are normal. Speech and behavior are normal. Patient exhibits appropriate insight and judgement.   ____________________________________________   LABS (all labs ordered are listed, but only abnormal results are displayed)  Labs Reviewed - No data to display ____________________________________________  EKG   ____________________________________________  RADIOLOGY   No results found.  ____________________________________________    PROCEDURES  Procedure(s) performed:    Procedures    Medications  ibuprofen (ADVIL,MOTRIN) tablet 600 mg (600 mg Oral Given 08/28/17 2133)  diphenhydrAMINE (BENADRYL) injection 50 mg (50 mg Intramuscular Given 08/28/17 2136)      ____________________________________________   INITIAL IMPRESSION / ASSESSMENT AND PLAN / ED COURSE  Pertinent labs & imaging results that were available during my care of the patient were reviewed by me and considered in my medical decision making (see chart for details).  Review of the [place] CSRS was performed in accordance of the [place] prior to dispensing any controlled drugs.     Patient's diagnosis is consistent with acute maxillary sinusitis.. Patient will be discharged home with prescriptions for Augmentin, Flonase, Bromfed cough syrup. Patient is to follow up with primary care as needed or otherwise directed. Patient is given ED precautions to return to the ED for any worsening or new symptoms.  Addendum: Patient began to itch the emergency department with no medication administration here. Patient is given Benadryl for itching. She is also endorsing headache. Patient was given ibuprofen for headache.   ____________________________________________  FINAL CLINICAL IMPRESSION(S) / ED DIAGNOSES  Final diagnoses:  Acute non-recurrent maxillary sinusitis      NEW MEDICATIONS STARTED DURING THIS VISIT:  New Prescriptions   AMOXICILLIN-CLAVULANATE (AUGMENTIN) 875-125 MG TABLET    Take 1 tablet by mouth 2 (two) times daily.   BROMPHENIRAMINE-PSEUDOEPHEDRINE-DM 30-2-10 MG/5ML SYRUP    Take 10 mLs by mouth 4 (four) times  daily as needed.   FLUTICASONE (FLONASE) 50 MCG/ACT NASAL SPRAY    Place 1 spray into both nostrils 2 (two) times daily.        This chart was dictated using voice recognition software/Dragon. Despite best efforts to proofread, errors can occur which can change the meaning. Any change was purely unintentional.    Darletta Moll, PA-C 08/28/17 2114    Amardeep Beckers, Charline Bills, PA-C 08/28/17 2149    Merlyn Lot, MD 08/28/17 2215

## 2017-08-28 NOTE — ED Notes (Signed)
Pt discharged to home.  Family member driving.  Discharge instructions reviewed.  Verbalized understanding.  No questions or concerns at this time.  Teach back verified.  Pt in NAD.  No items left in ED.   

## 2018-01-25 ENCOUNTER — Ambulatory Visit: Payer: BLUE CROSS/BLUE SHIELD | Admitting: Family Medicine

## 2018-01-25 ENCOUNTER — Encounter: Payer: Self-pay | Admitting: Family Medicine

## 2018-01-25 ENCOUNTER — Ambulatory Visit: Payer: Self-pay | Admitting: Family Medicine

## 2018-01-25 VITALS — BP 120/70 | HR 95 | Temp 98.4°F | Resp 16 | Ht 64.0 in | Wt 241.6 lb

## 2018-01-25 DIAGNOSIS — Z114 Encounter for screening for human immunodeficiency virus [HIV]: Secondary | ICD-10-CM | POA: Diagnosis not present

## 2018-01-25 DIAGNOSIS — R3915 Urgency of urination: Secondary | ICD-10-CM

## 2018-01-25 DIAGNOSIS — E039 Hypothyroidism, unspecified: Secondary | ICD-10-CM | POA: Diagnosis not present

## 2018-01-25 DIAGNOSIS — R300 Dysuria: Secondary | ICD-10-CM | POA: Diagnosis not present

## 2018-01-25 DIAGNOSIS — R5383 Other fatigue: Secondary | ICD-10-CM | POA: Diagnosis not present

## 2018-01-25 DIAGNOSIS — I10 Essential (primary) hypertension: Secondary | ICD-10-CM | POA: Insufficient documentation

## 2018-01-25 DIAGNOSIS — G8929 Other chronic pain: Secondary | ICD-10-CM

## 2018-01-25 DIAGNOSIS — K625 Hemorrhage of anus and rectum: Secondary | ICD-10-CM | POA: Diagnosis not present

## 2018-01-25 DIAGNOSIS — R7303 Prediabetes: Secondary | ICD-10-CM | POA: Diagnosis not present

## 2018-01-25 DIAGNOSIS — M25512 Pain in left shoulder: Secondary | ICD-10-CM

## 2018-01-25 DIAGNOSIS — K219 Gastro-esophageal reflux disease without esophagitis: Secondary | ICD-10-CM

## 2018-01-25 DIAGNOSIS — R0683 Snoring: Secondary | ICD-10-CM | POA: Diagnosis not present

## 2018-01-25 DIAGNOSIS — Z716 Tobacco abuse counseling: Secondary | ICD-10-CM

## 2018-01-25 DIAGNOSIS — Z8719 Personal history of other diseases of the digestive system: Secondary | ICD-10-CM

## 2018-01-25 DIAGNOSIS — Z113 Encounter for screening for infections with a predominantly sexual mode of transmission: Secondary | ICD-10-CM | POA: Diagnosis not present

## 2018-01-25 HISTORY — DX: Hypothyroidism, unspecified: E03.9

## 2018-01-25 LAB — POCT URINALYSIS DIPSTICK
Appearance: NORMAL
Bilirubin, UA: NEGATIVE
Blood, UA: NEGATIVE
Glucose, UA: NORMAL
Ketones, UA: NEGATIVE
Leukocytes, UA: NEGATIVE
Nitrite, UA: NEGATIVE
Odor: NORMAL
Protein, UA: NEGATIVE
Spec Grav, UA: <=1.005 — AB (ref 1.010–1.025)
Urobilinogen, UA: 0.2 E.U./dL
pH, UA: 6 (ref 5.0–8.0)

## 2018-01-25 MED ORDER — AMLODIPINE BESYLATE 5 MG PO TABS: 5.0000 mg | ORAL_TABLET | Freq: Every day | ORAL | 2 refills | Status: DC

## 2018-01-25 MED ORDER — PANTOPRAZOLE SODIUM 40 MG PO TBEC: 40.0000 mg | DELAYED_RELEASE_TABLET | Freq: Every day | ORAL | 2 refills | Status: DC

## 2018-01-25 NOTE — Progress Notes (Signed)
Name: Lynn Wilson   MRN: [id number]    DOB: March 03, 1981   Date:01/25/2018       Progress Note  Subjective  Chief Complaint  Chief Complaint  Patient presents with  . Hypertension  . Obesity  . Hyperglycemia    HPI  Lynn Wilson came in today to establish care and medication refill  HTN: she stopped taking HCTZ, she states it causes worsening of urinary frequency and back pain, she has been taking Norvasc, allergic to ace ( she developed a rash). Denies palpitation or SOB  Left shoulder pain: chronic, with decrease in abduction and anterior chest wall is sore, needs to return   Low back pain: also chronic, aching like, right lower back, constant, aggravated by activity. She states sometimes it improves with Bentyl or ibuprofen. She states it is like a cramp. Symptoms started a couple of years ago without any injury  Pre-diabetes: no recent labs, denies polyphagia, polydipsia or polyuria.   Obesity: she developed obesity at age 33, she states that is when she stopped being physically active. Maximum weight 260 lbs. Currently she is trying to avoid bread, sodas but she likes sweet tea, she is active at work but not exercising  Snoring: she has loud snoring, feels tired during the day, falls asleep when sitting still. ESS of 18.   Hypothyroidism: she is taking synthroid 100 mcg daily but is feeling more tired lately, no recent labs, she has not noticed a change in bowel movement or weight recently  Rectal bleeding: history of hemorrhoids, was given rx of anusol by gi in the past , no recent evaluation, having monthly rectal bleeding prior to her cycles with clotting formation.   Dysuria: she states this past weekend she developed urinary frequency and burning, symptoms improved, no fever or chills, no blood in urine.   GERD: she was diagnosed with gastritis and has been taking PPI , discussed long term use of PPI. Discussed life style modification   History reviewed. No pertinent  surgical history.  Family History  Problem Relation Age of Onset  . Hypertension Mother   . Hypertension Father   . Stroke Father     Social History   Socioeconomic History  . Marital status: Single    Spouse name: Curator  . Number of children: 0  . Years of education: Not on file  . Highest education level: Associate degree: occupational, Hotel manager, or vocational program  Social Needs  . Financial resource strain: Somewhat hard  . Food insecurity - worry: Sometimes true  . Food insecurity - inability: Sometimes true  . Transportation needs - medical: No  . Transportation needs - non-medical: No  Occupational History  . Occupation: Chiropractor: [employer]  Tobacco Use  . Smoking status: Current Every Day Smoker    Packs/day: 0.50    Years: 18.00    Pack years: 9.00    Types: Cigarettes    Start date: 01/26/2000  . Smokeless tobacco: Never Used  . Tobacco comment: contemplating   Substance and Sexual Activity  . Alcohol use: Yes    Alcohol/week: 2.4 oz    Types: 4 Cans of beer per week  . Drug use: No  . Sexual activity: Yes    Birth control/protection: Other-see comments    Comment: homosexual   Other Topics Concern  . Not on file  Social History Narrative   She moved to [place] from [place], [place] in the Summer of 2018   She lives  with her girlfriend - homosexual - together for the past 3 years   She does not have any children, but girlfriend has an adult daughter that lives with them.      Current Outpatient Medications:  .  amLODipine (NORVASC) 5 MG tablet, Take 1 tablet (5 mg total) by mouth daily., Disp: 30 tablet, Rfl: 2 .  dicyclomine (BENTYL) 20 MG tablet, Take 1 tablet (20 mg total) by mouth 4 (four) times daily -  before meals and at bedtime. (Patient taking differently: Take 20 mg by mouth as needed. ), Disp: 40 tablet, Rfl: 0 .  hydrocortisone (ANUSOL-HC) 25 MG suppository, Place 1 suppository (25 mg total) rectally 2 (two) times  daily., Disp: 12 suppository, Rfl: 0 .  levothyroxine (SYNTHROID, LEVOTHROID) 100 MCG tablet, Take 100 mcg by mouth every morning., Disp: , Rfl:  .  pantoprazole (PROTONIX) 40 MG tablet, Take 1 tablet (40 mg total) by mouth daily., Disp: 30 tablet, Rfl: 2  Allergies  Allergen Reactions  . Celebrex [Celecoxib]     "shakes/tremors"  . Sesame Seed (Diagnostic) Itching  . Lisinopril Rash     ROS  Constitutional: Negative for fever or weight change.  Respiratory: Negative for cough and shortness of breath.   Cardiovascular: Negative for chest pain or palpitations.  Gastrointestinal: Negative for abdominal pain, no bowel changes.  Musculoskeletal: Negative for gait problem or joint swelling.  Skin: Negative for rash.  Neurological: Negative for dizziness or headache.  No other specific complaints in a complete review of systems (except as listed in HPI above).  Objective  Vitals:   01/25/18 1605  BP: 120/70  Pulse: 95  Resp: 16  Temp: 98.4 F (36.9 C)  TempSrc: Oral  SpO2: 98%  Weight: 241 lb 9.6 oz (109.6 kg)  Height: 5\' 4"  (1.626 m)    Body mass index is 41.47 kg/m.  Physical Exam  Constitutional: Patient appears well-developed and well-nourished. Obese  No distress.  HEENT: head atraumatic, normocephalic, pupils equal and reactive to light,  neck supple, throat within normal limits Cardiovascular: Normal rate, regular rhythm and normal heart sounds.  No murmur heard. No BLE edema. Pulmonary/Chest: Effort normal and breath sounds normal. No respiratory distress. Abdominal: Soft.  There is no tenderness. Psychiatric: Patient has a normal mood and affect. behavior is normal. Judgment and thought content normal. Muscular Skeletal: she has pain with abduction of left shoulder and chest wall pain.   Recent Results (from the past 2160 hour(s))  POCT Urinalysis Dipstick     Status: Abnormal   Collection Time: 01/25/18  4:29 PM  Result Value Ref Range   Color, UA yellow     Clarity, UA clear    Glucose, UA normal    Bilirubin, UA neg    Ketones, UA neg    Spec Grav, UA <=1.005 (A) 1.010 - 1.025   Blood, UA neg    pH, UA 6.0 5.0 - 8.0   Protein, UA neg    Urobilinogen, UA 0.2 0.2 or 1.0 E.U./dL   Nitrite, UA neg    Leukocytes, UA Negative Negative   Appearance normal    Odor normal      PHQ2/9: Depression screen PHQ 2/9 01/25/2018  Decreased Interest 0  Down, Depressed, Hopeless 0  PHQ - 2 Score 0     Fall Risk: Fall Risk  01/25/2018  Falls in the past year? No     Functional Status Survey: Is the patient deaf or have difficulty hearing?: No Does the patient  have difficulty seeing, even when wearing glasses/contacts?: No Does the patient have difficulty concentrating, remembering, or making decisions?: No Does the patient have difficulty walking or climbing stairs?: No Does the patient have difficulty dressing or bathing?: No Does the patient have difficulty doing errands alone such as visiting a doctor's office or shopping?: No   Assessment & Plan  1. Dysuria  - POCT Urinalysis Dipstick - Urine Culture - C. trachomatis/N. gonorrhoeae RNA  2. Urgency of urination  - POCT Urinalysis Dipstick - Urine Culture - C. trachomatis/N. gonorrhoeae RNA  3. Morbid obesity ([place])  Discussed with the patient the risk posed by an increased BMI. Discussed importance of portion control, calorie counting and at least 150 minutes of physical activity weekly. Avoid sweet beverages and drink more water. Eat at least 6 servings of fruit and vegetables daily  - Lipid panel - Amb ref to Medical Nutrition Therapy-[facility]  4. Essential hypertension  - Amb ref to Medical Nutrition Therapy-[facility] - Ambulatory referral to Sleep Studies - amLODipine (NORVASC) 5 MG tablet; Take 1 tablet (5 mg total) by mouth daily.  Dispense: 30 tablet; Refill: 2  5. Pre-diabetes  - Hemoglobin A1c - Amb ref to Medical Nutrition Therapy-[facility]  6. Hypothyroidism, adult  -  TSH  7. Other fatigue  - CBC with Differential/Platelet - Comprehensive metabolic panel - Vitamin V03 - VITAMIN D 25 Hydroxy (Vit-D Deficiency, Fractures)  8. Encounter for screening for HIV  - HIV antibody  9. Routine screening for STI (sexually transmitted infection)  - RPR - Hepatitis, Acute  10. Rectal bleeding  - Ambulatory referral to Gastroenterology  11. History of hemorrhoids  - Ambulatory referral to Gastroenterology  12. Snoring  - Ambulatory referral to Sleep Studies ESS 18 Neck circumference was 15  13. Gastroesophageal reflux disease without esophagitis  - pantoprazole (PROTONIX) 40 MG tablet; Take 1 tablet (40 mg total) by mouth daily.  Dispense: 30 tablet; Refill: 2  14. Encounter for tobacco use cessation counseling  - Ambulatory referral to Gastroenterology

## 2018-01-25 NOTE — Patient Instructions (Addendum)
Obesity, Adult Obesity is the condition of having too much total body fat. Being overweight or obese means that your weight is greater than what is considered healthy for your body size. Obesity is determined by a measurement called BMI. BMI is an estimate of body fat and is calculated from height and weight. For adults, a BMI of 30 or higher is considered obese. Obesity can eventually lead to other health concerns and major illnesses, including:  Stroke.  Coronary artery disease (CAD).  Type 2 diabetes.  Some types of cancer, including cancers of the colon, breast, uterus, and gallbladder.  Osteoarthritis.  High blood pressure (hypertension).  High cholesterol.  Sleep apnea.  Gallbladder stones.  Infertility problems.  What are the causes? The main cause of obesity is taking in (consuming) more calories than your body uses for energy. Other factors that contribute to this condition may include:  Being born with genes that make you more likely to become obese.  Having a medical condition that causes obesity. These conditions include: ? Hypothyroidism. ? Polycystic ovarian syndrome (PCOS). ? Binge-eating disorder. ? Cushing syndrome.  Taking certain medicines, such as steroids, antidepressants, and seizure medicines.  Not being physically active (sedentary lifestyle).  Living where there are limited places to exercise safely or buy healthy foods.  Not getting enough sleep.  What increases the risk? The following factors may increase your risk of this condition:  Having a family history of obesity.  Being a woman of African-American descent.  Being a man of Hispanic descent.  What are the signs or symptoms? Having excessive body fat is the main symptom of this condition. How is this diagnosed? This condition may be diagnosed based on:  Your symptoms.  Your medical history.  A physical exam. Your health care provider may measure: ? Your BMI. If you are an  adult with a BMI between 25 and less than 30, you are considered overweight. If you are an adult with a BMI of 30 or higher, you are considered obese. ? The distances around your hips and your waist (circumferences). These may be compared to each other to help diagnose your condition. ? Your skinfold thickness. Your health care provider may gently pinch a fold of your skin and measure it.  How is this treated? Treatment for this condition often includes changing your lifestyle. Treatment may include some or all of the following:  Dietary changes. Work with your health care provider and a dietitian to set a weight-loss goal that is healthy and reasonable for you. Dietary changes may include eating: ? Smaller portions. A portion size is the amount of a particular food that is healthy for you to eat at one time. This varies from person to person. ? Low-calorie or low-fat options. ? More whole grains, fruits, and vegetables.  Regular physical activity. This may include aerobic activity (cardio) and strength training.  Medicine to help you lose weight. Your health care provider may prescribe medicine if you are unable to lose 1 pound a week after 6 weeks of eating more healthily and doing more physical activity.  Surgery. Surgical options may include gastric banding and gastric bypass. Surgery may be done if: ? Other treatments have not helped to improve your condition. ? You have a BMI of 40 or higher. ? You have life-threatening health problems related to obesity.  Follow these instructions at home:  Eating and drinking   Follow recommendations from your health care provider about what you eat and drink. Your health   care provider may advise you to: ? Limit fast foods, sweets, and processed snack foods. ? Choose low-fat options, such as low-fat milk instead of whole milk. ? Eat 5 or more servings of fruits or vegetables every day. ? Eat at home more often. This gives you more control over  what you eat. ? Choose healthy foods when you eat out. ? Learn what a healthy portion size is. ? Keep low-fat snacks on hand.  Diabetes Mellitus and Nutrition When you have diabetes (diabetes mellitus), it is very important to have healthy eating habits because your blood sugar (glucose) levels are greatly affected by what you eat and drink. Eating healthy foods in the appropriate amounts, at about the same times every day, can help you: Control your blood glucose. Lower your risk of heart disease. Improve your blood pressure. Reach or maintain a healthy weight.  Every person with diabetes is different, and each person has different needs for a meal plan. Your health care provider may recommend that you work with a diet and nutrition specialist (dietitian) to make a meal plan that is best for you. Your meal plan may vary depending on factors such as: The calories you need. The medicines you take. Your weight. Your blood glucose, blood pressure, and cholesterol levels. Your activity level. Other health conditions you have, such as heart or kidney disease.  How do carbohydrates affect me? Carbohydrates affect your blood glucose level more than any other type of food. Eating carbohydrates naturally increases the amount of glucose in your blood. Carbohydrate counting is a method for keeping track of how many carbohydrates you eat. Counting carbohydrates is important to keep your blood glucose at a healthy level, especially if you use insulin or take certain oral diabetes medicines. It is important to know how many carbohydrates you can safely have in each meal. This is different for every person. Your dietitian can help you calculate how many carbohydrates you should have at each meal and for snack. Foods that contain carbohydrates include: Bread, cereal, rice, pasta, and crackers. Potatoes and corn. Peas, beans, and lentils. Milk and yogurt. Fruit and juice. Desserts, such as cakes,  cookies, ice cream, and candy.  How does alcohol affect me? Alcohol can cause a sudden decrease in blood glucose (hypoglycemia), especially if you use insulin or take certain oral diabetes medicines. Hypoglycemia can be a life-threatening condition. Symptoms of hypoglycemia (sleepiness, dizziness, and confusion) are similar to symptoms of having too much alcohol. If your health care provider says that alcohol is safe for you, follow these guidelines: Limit alcohol intake to no more than 1 drink per day for nonpregnant women and 2 drinks per day for men. One drink equals 12 oz of beer, 5 oz of wine, or 1 oz of hard liquor. Do not drink on an empty stomach. Keep yourself hydrated with water, diet soda, or unsweetened iced tea. Keep in mind that regular soda, juice, and other mixers may contain a lot of sugar and must be counted as carbohydrates.  What are tips for following this plan? Reading food labels Start by checking the serving size on the label. The amount of calories, carbohydrates, fats, and other nutrients listed on the label are based on one serving of the food. Many foods contain more than one serving per package. Check the total grams (g) of carbohydrates in one serving. You can calculate the number of servings of carbohydrates in one serving by dividing the total carbohydrates by 15. For example, if a  food has 30 g of total carbohydrates, it would be equal to 2 servings of carbohydrates. Check the number of grams (g) of saturated and trans fats in one serving. Choose foods that have low or no amount of these fats. Check the number of milligrams (mg) of sodium in one serving. Most people should limit total sodium intake to less than 2,300 mg per day. Always check the nutrition information of foods labeled as "low-fat" or "nonfat". These foods may be higher in added sugar or refined carbohydrates and should be avoided. Talk to your dietitian to identify your daily goals for nutrients  listed on the label. Shopping Avoid buying canned, premade, or processed foods. These foods tend to be high in fat, sodium, and added sugar. Shop around the outside edge of the grocery store. This includes fresh fruits and vegetables, bulk grains, fresh meats, and fresh dairy. Cooking Use low-heat cooking methods, such as baking, instead of high-heat cooking methods like deep frying. Cook using healthy oils, such as olive, canola, or sunflower oil. Avoid cooking with butter, cream, or high-fat meats. Meal planning Eat meals and snacks regularly, preferably at the same times every day. Avoid going long periods of time without eating. Eat foods high in fiber, such as fresh fruits, vegetables, beans, and whole grains. Talk to your dietitian about how many servings of carbohydrates you can eat at each meal. Eat 4-6 ounces of lean protein each day, such as lean meat, chicken, fish, eggs, or tofu. 1 ounce is equal to 1 ounce of meat, chicken, or fish, 1 egg, or 1/4 cup of tofu. Eat some foods each day that contain healthy fats, such as avocado, nuts, seeds, and fish. Lifestyle  Check your blood glucose regularly. Exercise at least 30 minutes 5 or more days each week, or as told by your health care provider. Take medicines as told by your health care provider. Do not use any products that contain nicotine or tobacco, such as cigarettes and e-cigarettes. If you need help quitting, ask your health care provider. Work with a Social worker or diabetes educator to identify strategies to manage stress and any emotional and social challenges. What are some questions to ask my health care provider? Do I need to meet with a diabetes educator? Do I need to meet with a dietitian? What number can I call if I have questions? When are the best times to check my blood glucose? Where to find more information: American Diabetes Association: diabetes.org/food-and-fitness/food Academy of Nutrition and Dietetics:  PokerClues.dk [employer] of Diabetes and Digestive and Kidney Diseases (NIH): ContactWire.be Summary A healthy meal plan will help you control your blood glucose and maintain a healthy lifestyle. Working with a diet and nutrition specialist (dietitian) can help you make a meal plan that is best for you. Keep in mind that carbohydrates and alcohol have immediate effects on your blood glucose levels. It is important to count carbohydrates and to use alcohol carefully. This information is not intended to replace advice given to you by your health care provider. Make sure you discuss any questions you have with your health care provider. Document Released: 08/27/2005 Document Revised: 01/04/2017 Document Reviewed: 01/04/2017 Elsevier Interactive Patient Education  [employer]. ? Avoid sugary drinks, such as soda, fruit juice, iced tea sweetened with sugar, and flavored milk. ? Eat a healthy breakfast.  Drink enough water to keep your urine clear or pale yellow.  Do not go without eating for long periods of time (do not fast) or  follow a fad diet. Fasting and fad diets can be unhealthy and even dangerous. Physical Activity  Exercise regularly, as told by your health care provider. Ask your health care provider what types of exercise are safe for you and how often you should exercise.  Warm up and stretch before being active.  Cool down and stretch after being active.  Rest between periods of activity. Lifestyle  Limit the time that you spend in front of your TV, computer, or video game system.  Find ways to reward yourself that do not involve food.  Limit alcohol intake to no more than 1 drink a day for nonpregnant women and 2 drinks a day for men. One drink equals 12 oz of beer, 5 oz of wine, or 1 oz of hard liquor. General instructions  Keep a weight  loss journal to keep track of the food you eat and how much you exercise you get.  Take over-the-counter and prescription medicines only as told by your health care provider.  Take vitamins and supplements only as told by your health care provider.  Consider joining a support group. Your health care provider may be able to recommend a support group.  Keep all follow-up visits as told by your health care provider. This is important. Contact a health care provider if:  You are unable to meet your weight loss goal after 6 weeks of dietary and lifestyle changes. This information is not intended to replace advice given to you by your health care provider. Make sure you discuss any questions you have with your health care provider. Document Released: 01/07/2005 Document Revised: 05/04/2016 Document Reviewed: 09/18/2015 Elsevier Interactive Patient Education  2018 [employer].  Smoking Tobacco Information Smoking tobacco will very likely harm your health. Tobacco contains a poisonous (toxic), colorless chemical called nicotine. Nicotine affects the brain and makes tobacco addictive. This change in your brain can make it hard to stop smoking. Tobacco also has other toxic chemicals that can hurt your body and raise your risk of many cancers. How can smoking tobacco affect me? Smoking tobacco can increase your chances of having serious health conditions, such as:  Cancer. Smoking is most commonly associated with lung cancer, but can lead to cancer in other parts of the body.  Chronic obstructive pulmonary disease (COPD). This is a long-term lung condition that makes it hard to breathe. It also gets worse over time.  High blood pressure (hypertension), heart disease, stroke, or heart attack.  Lung infections, such as pneumonia.  Cataracts. This is when the lenses in the eyes become clouded.  Digestive problems. This may include peptic ulcers, heartburn, and gastroesophageal reflux disease  (GERD).  Oral health problems, such as gum disease and tooth loss.  Loss of taste and smell.  Smoking can affect your appearance by causing:  Wrinkles.  Yellow or stained teeth, fingers, and fingernails.  Smoking tobacco can also affect your social life.  Many workplaces, [employer], hotels, and public places are tobacco-free. This means that you may experience challenges in finding places to smoke when away from home.  The cost of a smoking habit can be expensive. Expenses for someone who smokes come in two ways: ? You spend money on a regular basis to buy tobacco. ? Your health care costs in the long-term are higher if you smoke.  Tobacco smoke can also affect the health of those around you. Children of smokers have greater chances of: ? Sudden infant death syndrome (SIDS). ? Ear infections. ? Lung infections.  What lifestyle changes can be made?  Do not start smoking. Quit if you already do.  To quit smoking: ? Make a plan to quit smoking and commit yourself to it. Look for programs to help you and ask your health care provider for recommendations and ideas. ? Talk with your health care provider about using nicotine replacement medicines to help you quit. Medicine replacement medicines include gum, lozenges, patches, sprays, or pills. ? Do not replace cigarette smoking with electronic cigarettes, which are commonly called e-cigarettes. The safety of e-cigarettes is not known, and some may contain harmful chemicals. ? Avoid places, people, or situations that tempt you to smoke. ? If you try to quit but return to smoking, don't give up hope. It is very common for people to try a number of times before they fully succeed. When you feel ready again, give it another try.  Quitting smoking might affect the way you eat as well as your weight. Be prepared to monitor your eating habits. Get support in planning and following a healthy diet.  Ask your health care provider about having  regular tests (screenings) to check for cancer. This may include blood tests, imaging tests, and other tests.  Exercise regularly. Consider taking walks, joining a gym, or doing yoga or exercise classes.  Develop skills to manage your stress. These skills include meditation. What are the benefits of quitting smoking? By quitting smoking, you may:  Lower your risk of getting cancer and other diseases caused by smoking.  Live longer.  Breathe better.  Lower your blood pressure and heart rate.  Stop your addiction to tobacco.  Stop creating secondhand smoke that hurts other people.  Improve your sense of taste and smell.  Look better over time, due to having fewer wrinkles and less staining.  What can happen if changes are not made? If you do not stop smoking, you may:  Get cancer and other diseases.  Develop COPD or other long-term (chronic) lung conditions.  Develop serious problems with your heart and blood vessels (cardiovascular system).  Need more tests to screen for problems caused by smoking.  Have higher, long-term healthcare costs from medicines or treatments related to smoking.  Continue to have worsening changes in your lungs, mouth, and nose.  Where to find support: To get support to quit smoking, consider:  Asking your health care provider for more information and resources.  Taking classes to learn more about quitting smoking.  Looking for local organizations that offer resources about quitting smoking.  Joining a support group for people who want to quit smoking in your local community.  Where to find more information: You may find more information about quitting smoking from:  HelpGuide.org: www.helpguide.org/articles/addictions/how-to-quit-smoking.htm  https://hall.com/: smokefree.gov  American Lung Association: www.lung.org  Contact a health care provider if:  You have problems breathing.  Your lips, nose, or fingers turn blue.  You have  chest pain.  You are coughing up blood.  You feel faint or you pass out.  You have other noticeable changes that cause you to worry. Summary  Smoking tobacco can negatively affect your health, the health of those around you, your finances, and your social life.  Do not start smoking. Quit if you already do. If you need help quitting, ask your health care provider.  Think about joining a support group for people who want to quit smoking in your local community. There are many effective programs that will help you to quit this behavior. This information is not  intended to replace advice given to you by your health care provider. Make sure you discuss any questions you have with your health care provider. Document Released: 12/15/2016 Document Revised: 12/15/2016 Document Reviewed: 12/15/2016 Elsevier Interactive Patient Education  2018 [employer].  Back Pain, Adult Many adults have back pain from time to time. Common causes of back pain include:  A strained muscle or ligament.  Wear and tear (degeneration) of the spinal disks.  Arthritis.  A hit to the back.  Back pain can be short-lived (acute) or last a long time (chronic). A physical exam, lab tests, and imaging studies may be done to find the cause of your pain. Follow these instructions at home: Managing pain and stiffness  Take over-the-counter and prescription medicines only as told by your health care provider.  If directed, apply heat to the affected area as often as told by your health care provider. Use the heat source that your health care provider recommends, such as a moist heat pack or a heating pad. ? Place a towel between your skin and the heat source. ? Leave the heat on for 20-30 minutes. ? Remove the heat if your skin turns bright red. This is especially important if you are unable to feel pain, heat, or cold. You have a greater risk of getting burned.  If directed, apply ice to the injured area: ? Put  ice in a plastic bag. ? Place a towel between your skin and the bag. ? Leave the ice on for 20 minutes, 2-3 times a day for the first 2-3 days. Activity  Do not stay in bed. Resting more than 1-2 days can delay your recovery.  Take short walks on even surfaces as soon as you are able. Try to increase the length of time you walk each day.  Do not sit, drive, or stand in one place for more than 30 minutes at a time. Sitting or standing for long periods of time can put stress on your back.  Use proper lifting techniques. When you bend and lift, use positions that put less stress on your back: ? [place] your knees. ? Keep the load close to your body. ? Avoid twisting.  Exercise regularly as told by your health care provider. Exercising will help your back heal faster. This also helps prevent back injuries by keeping muscles strong and flexible.  Your health care provider may recommend that you see a physical therapist. This person can help you come up with a safe exercise program. Do any exercises as told by your physical therapist. Lifestyle  Maintain a healthy weight. Extra weight puts stress on your back and makes it difficult to have good posture.  Avoid activities or situations that make you feel anxious or stressed. Learn ways to manage anxiety and stress. One way to manage stress is through exercise. Stress and anxiety increase muscle tension and can make back pain worse. General instructions  Sleep on a firm mattress in a comfortable position. Try lying on your side with your knees slightly bent. If you lie on your back, put a pillow under your knees.  Follow your treatment plan as told by your health care provider. This may include: ? Cognitive or behavioral therapy. ? Acupuncture or massage therapy. ? Meditation or yoga. Contact a health care provider if:  You have pain that is not relieved with rest or medicine.  You have increasing pain going down into your legs or  buttocks.  Your pain does not improve  in 2 weeks.  You have pain at night.  You lose weight.  You have a fever or chills. Get help right away if:  You develop new bowel or bladder control problems.  You have unusual weakness or numbness in your arms or legs.  You develop nausea or vomiting.  You develop abdominal pain.  You feel faint. Summary  Many adults have back pain from time to time. A physical exam, lab tests, and imaging studies may be done to find the cause of your pain.  Use proper lifting techniques. When you bend and lift, use positions that put less stress on your back.  Take over-the-counter and prescription medicines and apply heat or ice as directed by your health care provider. This information is not intended to replace advice given to you by your health care provider. Make sure you discuss any questions you have with your health care provider. Document Released: 11/30/2005 Document Revised: 01/04/2017 Document Reviewed: 01/04/2017 Elsevier Interactive Patient Education  [employer].

## 2018-01-29 ENCOUNTER — Encounter: Payer: Self-pay | Admitting: Family Medicine

## 2018-01-29 ENCOUNTER — Other Ambulatory Visit: Payer: Self-pay | Admitting: Family Medicine

## 2018-01-29 DIAGNOSIS — R739 Hyperglycemia, unspecified: Secondary | ICD-10-CM | POA: Insufficient documentation

## 2018-01-29 DIAGNOSIS — E8881 Metabolic syndrome: Secondary | ICD-10-CM | POA: Insufficient documentation

## 2018-01-29 MED ORDER — VITAMIN D (ERGOCALCIFEROL) 1.25 MG (50000 UNIT) PO CAPS: 50000.0000 [IU] | ORAL_CAPSULE | ORAL | 0 refills | Status: DC

## 2018-01-31 ENCOUNTER — Telehealth: Payer: Self-pay | Admitting: Family Medicine

## 2018-01-31 NOTE — Telephone Encounter (Signed)
Reviewed results note and physician's note with patient. She will pick up Vitamin D prescription. Cannot chart on result note due to result note not forwarded to [facility].  Dr. Ancil Boozer note indicated to add ferritin, TIBC and Iron saturation to her labs which I don't see this has been done.  Also, she would like for the diabetic diet information to be mailed to her as noted.

## 2018-02-01 LAB — COMPREHENSIVE METABOLIC PANEL
AG Ratio: 1.2 (calc) (ref 1.0–2.5)
ALT: 16 U/L (ref 6–29)
AST: 23 U/L (ref 10–30)
Albumin: 4.2 g/dL (ref 3.6–5.1)
Alkaline phosphatase (APISO): 77 U/L (ref 33–115)
BUN: 10 mg/dL (ref 7–25)
CO2: 20 mmol/L (ref 20–32)
Calcium: 9 mg/dL (ref 8.6–10.2)
Chloride: 108 mmol/L (ref 98–110)
Creat: 0.72 mg/dL (ref 0.50–1.10)
Globulin: 3.6 g/dL (calc) (ref 1.9–3.7)
Glucose, Bld: 101 mg/dL — ABNORMAL HIGH (ref 65–99)
Potassium: 4.1 mmol/L (ref 3.5–5.3)
Sodium: 135 mmol/L (ref 135–146)
Total Bilirubin: 0.3 mg/dL (ref 0.2–1.2)
Total Protein: 7.8 g/dL (ref 6.1–8.1)

## 2018-02-01 LAB — CBC WITH DIFFERENTIAL/PLATELET
Basophils Absolute: 7 cells/uL (ref 0–200)
Basophils Relative: 0.1 %
Eosinophils Absolute: 67 cells/uL (ref 15–500)
Eosinophils Relative: 0.9 %
HCT: 32.7 % — ABNORMAL LOW (ref 35.0–45.0)
Hemoglobin: 10 g/dL — ABNORMAL LOW (ref 11.7–15.5)
Lymphs Abs: 2612 cells/uL (ref 850–3900)
MCH: 20.5 pg — ABNORMAL LOW (ref 27.0–33.0)
MCHC: 30.6 g/dL — ABNORMAL LOW (ref 32.0–36.0)
MCV: 67 fL — ABNORMAL LOW (ref 80.0–100.0)
MPV: 10.2 fL (ref 7.5–12.5)
Monocytes Relative: 6.3 %
Neutro Abs: 4248 cells/uL (ref 1500–7800)
Neutrophils Relative %: 57.4 %
Platelets: 429 10*3/uL — ABNORMAL HIGH (ref 140–400)
RBC: 4.88 10*6/uL (ref 3.80–5.10)
RDW: 18.2 % — ABNORMAL HIGH (ref 11.0–15.0)
Total Lymphocyte: 35.3 %
WBC mixed population: 466 cells/uL (ref 200–950)
WBC: 7.4 10*3/uL (ref 3.8–10.8)

## 2018-02-01 LAB — HEMOGLOBIN A1C
Hgb A1c MFr Bld: 6 % of total Hgb — ABNORMAL HIGH (ref <5.7)
Mean Plasma Glucose: 126 (calc)
eAG (mmol/L): 7 (calc)

## 2018-02-01 LAB — C. TRACHOMATIS/N. GONORRHOEAE RNA
C. trachomatis RNA, TMA: NOT DETECTED
N. gonorrhoeae RNA, TMA: NOT DETECTED

## 2018-02-01 LAB — HEPATITIS PANEL, ACUTE
Hep A IgM: NONREACTIVE
Hep B C IgM: NONREACTIVE
Hepatitis B Surface Ag: NONREACTIVE
Hepatitis C Ab: NONREACTIVE
SIGNAL TO CUT-OFF: 0.01 (ref <1.00)

## 2018-02-01 LAB — LIPID PANEL
Cholesterol: 160 mg/dL (ref <200)
HDL: 48 mg/dL — ABNORMAL LOW (ref >50)
LDL Cholesterol (Calc): 97 mg/dL (calc)
Non-HDL Cholesterol (Calc): 112 mg/dL (calc) (ref <130)
Total CHOL/HDL Ratio: 3.3 (calc) (ref <5.0)
Triglycerides: 68 mg/dL (ref <150)

## 2018-02-01 LAB — URINE CULTURE
MICRO NUMBER:: 90195111
SPECIMEN QUALITY:: ADEQUATE

## 2018-02-01 LAB — RPR: RPR Ser Ql: NONREACTIVE

## 2018-02-01 LAB — TEST AUTHORIZATION

## 2018-02-01 LAB — IRON,TIBC AND FERRITIN PANEL
%SAT: 6 % (calc) — ABNORMAL LOW (ref 11–50)
Ferritin: 8 ng/mL — ABNORMAL LOW (ref 10–154)
Iron: 28 ug/dL — ABNORMAL LOW (ref 40–190)
TIBC: 456 mcg/dL (calc) — ABNORMAL HIGH (ref 250–450)

## 2018-02-01 LAB — TSH: TSH: 2.22 mIU/L

## 2018-02-01 LAB — VITAMIN B12: Vitamin B-12: 449 pg/mL (ref 200–1100)

## 2018-02-01 LAB — CBC MORPHOLOGY

## 2018-02-01 LAB — HIV ANTIBODY (ROUTINE TESTING W REFLEX): HIV 1&2 Ab, 4th Generation: NONREACTIVE

## 2018-02-01 LAB — VITAMIN D 25 HYDROXY (VIT D DEFICIENCY, FRACTURES): Vit D, 25-Hydroxy: 10 ng/mL — ABNORMAL LOW (ref 30–100)

## 2018-02-04 ENCOUNTER — Ambulatory Visit: Payer: BLUE CROSS/BLUE SHIELD | Admitting: Gastroenterology

## 2018-02-14 ENCOUNTER — Emergency Department
Admission: EM | Admit: 2018-02-14 | Discharge: 2018-02-14 | Disposition: A | Discharge status: Home / Self Care | Payer: BLUE CROSS/BLUE SHIELD | Attending: Emergency Medicine | Admitting: Emergency Medicine

## 2018-02-14 ENCOUNTER — Encounter: Payer: Self-pay | Admitting: Intensive Care

## 2018-02-14 ENCOUNTER — Emergency Department: Payer: BLUE CROSS/BLUE SHIELD

## 2018-02-14 DIAGNOSIS — E039 Hypothyroidism, unspecified: Secondary | ICD-10-CM | POA: Insufficient documentation

## 2018-02-14 DIAGNOSIS — K5731 Diverticulosis of large intestine without perforation or abscess with bleeding: Secondary | ICD-10-CM | POA: Diagnosis not present

## 2018-02-14 DIAGNOSIS — K529 Noninfective gastroenteritis and colitis, unspecified: Secondary | ICD-10-CM | POA: Diagnosis not present

## 2018-02-14 DIAGNOSIS — F17210 Nicotine dependence, cigarettes, uncomplicated: Secondary | ICD-10-CM | POA: Insufficient documentation

## 2018-02-14 DIAGNOSIS — R197 Diarrhea, unspecified: Secondary | ICD-10-CM | POA: Diagnosis present

## 2018-02-14 DIAGNOSIS — Z79899 Other long term (current) drug therapy: Secondary | ICD-10-CM | POA: Diagnosis not present

## 2018-02-14 DIAGNOSIS — R109 Unspecified abdominal pain: Secondary | ICD-10-CM

## 2018-02-14 DIAGNOSIS — K5791 Diverticulosis of intestine, part unspecified, without perforation or abscess with bleeding: Secondary | ICD-10-CM

## 2018-02-14 DIAGNOSIS — I10 Essential (primary) hypertension: Secondary | ICD-10-CM | POA: Diagnosis not present

## 2018-02-14 LAB — COMPREHENSIVE METABOLIC PANEL
ALT: 25 U/L (ref 14–54)
AST: 32 U/L (ref 15–41)
Albumin: 3.9 g/dL (ref 3.5–5.0)
Alkaline Phosphatase: 74 U/L (ref 38–126)
Anion gap: 10 (ref 5–15)
BUN: 11 mg/dL (ref 6–20)
CO2: 21 mmol/L — ABNORMAL LOW (ref 22–32)
Calcium: 9 mg/dL (ref 8.9–10.3)
Chloride: 105 mmol/L (ref 101–111)
Creatinine, Ser: 0.65 mg/dL (ref 0.44–1.00)
GFR calc Af Amer: >60 mL/min (ref >60)
GFR calc non Af Amer: >60 mL/min (ref >60)
Glucose, Bld: 108 mg/dL — ABNORMAL HIGH (ref 65–99)
Potassium: 3.9 mmol/L (ref 3.5–5.1)
Sodium: 136 mmol/L (ref 135–145)
Total Bilirubin: 0.4 mg/dL (ref 0.3–1.2)
Total Protein: 8.2 g/dL — ABNORMAL HIGH (ref 6.5–8.1)

## 2018-02-14 LAB — CBC
HCT: 33.2 % — ABNORMAL LOW (ref 35.0–47.0)
Hemoglobin: 10.4 g/dL — ABNORMAL LOW (ref 12.0–16.0)
MCH: 20.8 pg — ABNORMAL LOW (ref 26.0–34.0)
MCHC: 31.3 g/dL — ABNORMAL LOW (ref 32.0–36.0)
MCV: 66.2 fL — ABNORMAL LOW (ref 80.0–100.0)
Platelets: 421 10*3/uL (ref 150–440)
RBC: 5.01 MIL/uL (ref 3.80–5.20)
RDW: 19.1 % — ABNORMAL HIGH (ref 11.5–14.5)
WBC: 6.6 10*3/uL (ref 3.6–11.0)

## 2018-02-14 LAB — URINALYSIS, COMPLETE (UACMP) WITH MICROSCOPIC
Bacteria, UA: NONE SEEN
Bilirubin Urine: NEGATIVE
Glucose, UA: NEGATIVE mg/dL
Hgb urine dipstick: NEGATIVE
Ketones, ur: NEGATIVE mg/dL
Nitrite: NEGATIVE
Protein, ur: NEGATIVE mg/dL
Specific Gravity, Urine: 1.014 (ref 1.005–1.030)
pH: 6 (ref 5.0–8.0)

## 2018-02-14 LAB — PREGNANCY, URINE: Preg Test, Ur: NEGATIVE

## 2018-02-14 LAB — LIPASE, BLOOD: Lipase: 28 U/L (ref 11–51)

## 2018-02-14 MED ORDER — ONDANSETRON HCL 4 MG PO TABS: 4.0000 mg | ORAL_TABLET | Freq: Three times a day (TID) | ORAL | 0 refills | Status: DC | PRN

## 2018-02-14 MED ORDER — IOPAMIDOL (ISOVUE-300) INJECTION 61%
100.0000 mL | Freq: Once | INTRAVENOUS | Status: AC | PRN
  Administered 2018-02-14: 100 mL via INTRAVENOUS

## 2018-02-14 MED ORDER — ACETAMINOPHEN 325 MG PO TABS
650.0000 mg | ORAL_TABLET | Freq: Once | ORAL | Status: AC
  Administered 2018-02-14: 650 mg via ORAL
  Filled 2018-02-14: qty 2

## 2018-02-14 MED ORDER — ONDANSETRON 4 MG PO TBDP
4.0000 mg | ORAL_TABLET | Freq: Once | ORAL | Status: AC | PRN
  Administered 2018-02-14: 4 mg via ORAL
  Filled 2018-02-14: qty 1

## 2018-02-14 MED ORDER — IOPAMIDOL (ISOVUE-300) INJECTION 61%
30.0000 mL | Freq: Once | INTRAVENOUS | Status: AC | PRN
  Administered 2018-02-14: 30 mL via ORAL

## 2018-02-14 MED ORDER — FENTANYL CITRATE (PF) 100 MCG/2ML IJ SOLN
50.0000 ug | Freq: Once | INTRAMUSCULAR | Status: AC
  Administered 2018-02-14: 50 ug via INTRAVENOUS
  Filled 2018-02-14: qty 2

## 2018-02-14 MED ORDER — SODIUM CHLORIDE 0.9 % IV BOLUS (SEPSIS)
1000.0000 mL | Freq: Once | INTRAVENOUS | Status: AC
Start: 2018-02-14 — End: 2018-02-14
  Administered 2018-02-14: 1000 mL via INTRAVENOUS

## 2018-02-14 NOTE — Discharge Instructions (Signed)
If you have fever, increased pain, persistent vomiting, worsening bleeding or any other concerns including feeling lightheadedness, return to the emergency department.  Follow closely with your primary care doctor and primary care doctor in the next 2 days.

## 2018-02-14 NOTE — ED Notes (Signed)
Pt discharged home after verbalizing understanding of discharge instructions; nad noted. 

## 2018-02-14 NOTE — ED Triage Notes (Signed)
Patient c/o abdominal pain with N/V/D and cramping in lower abdomen. Also c/o headache

## 2018-02-14 NOTE — ED Notes (Signed)
Pt resting and drinking contrast solution. NAD Noted.

## 2018-02-14 NOTE — ED Provider Notes (Addendum)
[facility] Emergency Department Provider Note  ____________________________________________   I have reviewed the triage vital signs and the nursing notes. Where available I have reviewed prior notes and, if possible and indicated, outside hospital notes.    HISTORY  Chief Complaint Abdominal Pain    HPI Lynn Wilson is a 37 y.o. female history of reflux disease hypertension hypothyroid, obesity, states that she is been having diarrhea this morning.  Also left-sided abdominal pain.  Nausea but no vomiting.  Patient does work with students, has positive sick contacts.  Has been having diarrhea mostly since this morning.  5 times.  Also has a slight gradual onset not worst headache of life headache.  Abdominal pain is crampy, focal the left side, nothing makes it better except for having a bowel movement, is worse right before she has to have one. Diarrhea is mostly clear with scant amounts of blood noted in it.  No melena.  Past Medical History:  Diagnosis Date  . GERD (gastroesophageal reflux disease)   . Hypertension   . Hypothyroid   . Hypothyroid 01/25/2018    Patient Active Problem List   Diagnosis Date Noted  . Hyperglycemia 01/29/2018  . Metabolic syndrome 02/40/9735  . Hypertension 01/25/2018  . GERD (gastroesophageal reflux disease) 01/25/2018  . Hypothyroid 01/25/2018  . Morbid obesity ([place]) 01/25/2018    History reviewed. No pertinent surgical history.  Prior to Admission medications   Medication Sig Start Date End Date Taking? Authorizing Provider  amLODipine (NORVASC) 5 MG tablet Take 1 tablet (5 mg total) by mouth daily. 01/25/18   Steele Sizer, MD  dicyclomine (BENTYL) 20 MG tablet Take 1 tablet (20 mg total) by mouth 4 (four) times daily -  before meals and at bedtime. Patient taking differently: Take 20 mg by mouth as needed.  08/30/15   Rolland Porter, MD  hydrocortisone (ANUSOL-HC) 25 MG suppository Place 1 suppository (25 mg  total) rectally 2 (two) times daily. 08/30/15   Rolland Porter, MD  levothyroxine (SYNTHROID, LEVOTHROID) 100 MCG tablet Take 100 mcg by mouth every morning.    [provider]  pantoprazole (PROTONIX) 40 MG tablet Take 1 tablet (40 mg total) by mouth daily. 01/25/18   Steele Sizer, MD  Vitamin D, Ergocalciferol, (DRISDOL) 50000 units CAPS capsule Take 1 capsule (50,000 Units total) by mouth every 7 (seven) days. 01/29/18   Steele Sizer, MD    Allergies Celebrex [celecoxib]; Sesame seed (diagnostic); and Lisinopril  Family History  Problem Relation Age of Onset  . Hypertension Mother   . Hypertension Father   . Stroke Father     Social History Social History   Tobacco Use  . Smoking status: Current Every Day Smoker    Packs/day: 0.50    Years: 18.00    Pack years: 9.00    Types: Cigarettes    Start date: 01/26/2000  . Smokeless tobacco: Never Used  . Tobacco comment: contemplating   Substance Use Topics  . Alcohol use: Yes    Alcohol/week: 2.4 oz    Types: 4 Cans of beer per week  . Drug use: No    Review of Systems Constitutional: No fever/chills Eyes: No visual changes. ENT: No sore throat. No stiff neck no neck pain Cardiovascular: Denies chest pain. Respiratory: Denies shortness of breath. Gastrointestinal: See HPI Genitourinary: Negative for dysuria. Musculoskeletal: Negative lower extremity swelling Skin: Negative for rash. Neurological: Negative for severe headaches, focal weakness or numbness.   ____________________________________________   PHYSICAL EXAM:  VITAL  SIGNS: ED Triage Vitals [02/14/18 0825]  Enc Vitals Group     BP (!) 158/92     Pulse Rate 77     Resp 16     Temp 97.7 F (36.5 C)     Temp Source Oral     SpO2 100 %     Weight 240 lb (108.9 kg)     Height 5\' 4"  (1.626 m)     Head Circumference      Peak Flow      Pain Score 6     Pain Loc      Pain Edu?      Excl. in [place]?     Constitutional: Alert and oriented.  Asleep  when I enter the room in no acute distress wakes up easily. Eyes: Conjunctivae are normal Head: Atraumatic HEENT: No congestion/rhinnorhea. Mucous membranes are moist.  Oropharynx non-erythematous Neck:   Nontender with no meningismus, no masses, no stridor Cardiovascular: Normal rate, regular rhythm. Grossly normal heart sounds.  Good peripheral circulation. Respiratory: Normal respiratory effort.  No retractions. Lungs CTAB. Abdominal: Soft and positive left-sided tenderness with voluntary guarding no rebound. No distention.  Soft obese nonsurgical abdomen  Back:  There is no focal tenderness or step off.  there is no midline tenderness there are no lesions noted. there is no CVA tenderness Musculoskeletal: No lower extremity tenderness, no upper extremity tenderness. No joint effusions, no DVT signs strong distal pulses no edema Neurologic:  Normal speech and language. No gross focal neurologic deficits are appreciated.  Skin:  Skin is warm, dry and intact. No rash noted. Psychiatric: Mood and affect are normal. Speech and behavior are normal.  ____________________________________________   LABS (all labs ordered are listed, but only abnormal results are displayed)  Labs Reviewed  URINALYSIS, COMPLETE (UACMP) WITH MICROSCOPIC - Abnormal; Notable for the following components:      Result Value   Color, Urine YELLOW (*)    APPearance HAZY (*)    Leukocytes, UA TRACE (*)    Squamous Epithelial / LPF 6-30 (*)    All other components within normal limits  PREGNANCY, URINE  LIPASE, BLOOD  COMPREHENSIVE METABOLIC PANEL  CBC    Pertinent labs  results that were available during my care of the patient were reviewed by me and considered in my medical decision making (see chart for details). ____________________________________________  EKG  I personally interpreted any EKGs ordered by me or triage  ____________________________________________  RADIOLOGY  Pertinent labs & imaging  results that were available during my care of the patient were reviewed by me and considered in my medical decision making (see chart for details). If possible, patient and/or family made aware of any abnormal findings.  No results found. ____________________________________________    PROCEDURES  Procedure(s) performed: None  Procedures  Critical Care performed: None  ____________________________________________   INITIAL IMPRESSION / ASSESSMENT AND PLAN / ED COURSE  Pertinent labs & imaging results that were available during my care of the patient were reviewed by me and considered in my medical decision making (see chart for details).  Patient here with left-sided abdominal discomfort, as well as nausea v and diarrhea, also has a slight headache.   As far as the headache is concerned, gradual onset not worst headache of life associated with other complaints which are much more significant to her,. At this time, there is nothing to suggest or support the diagnosis of subarachnoid hemorrhage, aneurysmal event, meningitis, tumor or mass, cavernous thrombosis, encephalitis, ischemic stroke,  pseudotumor cerebri, glaucoma, temporal arteritis, or any other acute intracrania/neurological process.   Abdominal pain is most likely from viral pathology however given focal pain and morbid obesity limiting exam will obtain a CT scan to rule out diverticular pathology.  We will give her pain medication IV fluids she has received antiemetics and we will continue to observe her here.  ----------------------------------------- 11:31 AM on 02/14/2018 -----------------------------------------  Been completely benign patient in no acute distress ambulating in the department feels much better after fluids headache is gone, did offer a rectal exam but she declined she understands the limitations of my workup without rectal exam but her hemoglobin is at her baseline, she states she only saw a few  "flecks" of blood in her stool.  Extensive return precautions given for any increased bleeding, melena, or other concerns including increased pain.  CT and blood work are reassuring.  Patient eager to go home.  We will discharge her with close outpatient follow-up and return precautions. Considering the patient's symptoms, medical history, and physical examination today, I have low suspicion for cholecystitis or biliary pathology, pancreatitis, perforation or bowel obstruction, hernia, intra-abdominal abscess, AAA or dissection, volvulus or intussusception, mesenteric ischemia, ischemic gut, pyelonephritis or appendicitis.   ____________________________________________   FINAL CLINICAL IMPRESSION(S) / ED DIAGNOSES  Final diagnoses:  None      This chart was dictated using voice recognition software.  Despite best efforts to proofread,  errors can occur which can change meaning.      Schuyler Amor, MD 02/14/18 [id number]    Schuyler Amor, MD 02/14/18 [id number]

## 2018-02-14 NOTE — ED Notes (Signed)
Pt up to restroom.

## 2018-02-15 ENCOUNTER — Ambulatory Visit: Payer: Self-pay | Admitting: *Deleted

## 2018-02-15 ENCOUNTER — Encounter: Payer: Self-pay | Admitting: Family Medicine

## 2018-02-15 NOTE — Telephone Encounter (Signed)
Pt called because she is having a severe headache and sinus drainage that started last week; she states she is having weakness and chest pain 02/10/18; she also says it hurts at the top of her chest when she inhales and she is having some wheezing; the pt states her greatest concern is the headache is on the left eye to the back of her neck; the most pain is behind her neck; this headache started 2 weeks ago, nurse triage initiated and recommendations made per protocol to include seeing a physician within 3 days; pt offered and accepted appointment with Dr Ancil Boozer at [facility],02/16/18 at [place]; pt verbalizes understanding.    Reason for Disposition . [1] MILD-MODERATE headache AND [2] present > 72 hours  Answer Assessment - Initial Assessment Questions 1. LOCATION: "Where does it hurt?"      Left eye to back of neck 2. ONSET: "When did the headache start?" (Minutes, hours or days)      2 weeks ago 3. PATTERN: "Does the pain come and go, or has it been constant since it started?"     constant 4. SEVERITY: "How bad is the pain?" and "What does it keep you from doing?"  (e.g., Scale 1-10; mild, moderate, or severe)   - MILD (1-3): doesn't interfere with normal activities    - MODERATE (4-7): interferes with normal activities or awakens from sleep    - SEVERE (8-10): excruciating pain, unable to do any normal activities        moderate 5. RECURRENT SYMPTOM: "Have you ever had headaches before?" If so, ask: "When was the last time?" and "What happened that time?"      Yes history of migraines 6. CAUSE: "What do you think is causing the headache?"     Feels like sinus 7. MIGRAINE: "Have you been diagnosed with migraine headaches?" If so, ask: "Is this headache similar?"      Yes, not similar though 8. HEAD INJURY: "Has there been any recent injury to the head?"      no 9. OTHER SYMPTOMS: "Do you have any other symptoms?" (fever, stiff neck, eye pain, sore throat, cold symptoms)     Runny nose,  nasal congestion, wheezing when she breaths, clear nasal drainage 10. PREGNANCY: "Is there any chance you are pregnant?" "When was your last menstrual period?"       No LMP 02/11/18  Protocols used: HEADACHE-A-AH

## 2018-02-16 ENCOUNTER — Encounter: Payer: Self-pay | Admitting: Family Medicine

## 2018-02-16 ENCOUNTER — Ambulatory Visit: Payer: BLUE CROSS/BLUE SHIELD | Admitting: Family Medicine

## 2018-02-16 VITALS — BP 140/86 | HR 95 | Resp 14 | Wt 239.4 lb

## 2018-02-16 DIAGNOSIS — D500 Iron deficiency anemia secondary to blood loss (chronic): Secondary | ICD-10-CM | POA: Diagnosis not present

## 2018-02-16 DIAGNOSIS — G43111 Migraine with aura, intractable, with status migrainosus: Secondary | ICD-10-CM

## 2018-02-16 DIAGNOSIS — G43009 Migraine without aura, not intractable, without status migrainosus: Secondary | ICD-10-CM | POA: Insufficient documentation

## 2018-02-16 DIAGNOSIS — K625 Hemorrhage of anus and rectum: Secondary | ICD-10-CM | POA: Diagnosis not present

## 2018-02-16 DIAGNOSIS — J0100 Acute maxillary sinusitis, unspecified: Secondary | ICD-10-CM

## 2018-02-16 HISTORY — DX: Iron deficiency anemia secondary to blood loss (chronic): D50.0

## 2018-02-16 MED ORDER — AMOXICILLIN-POT CLAVULANATE 875-125 MG PO TABS: 1.0000 | ORAL_TABLET | Freq: Two times a day (BID) | ORAL | 0 refills | Status: DC

## 2018-02-16 MED ORDER — FERROUS SULFATE 325 (65 FE) MG PO TABS: 325.0000 mg | ORAL_TABLET | Freq: Every day | ORAL | 3 refills | Status: DC

## 2018-02-16 MED ORDER — BACLOFEN 20 MG PO TABS: 20.0000 mg | ORAL_TABLET | Freq: Three times a day (TID) | ORAL | 0 refills | Status: DC

## 2018-02-16 MED ORDER — VITAMIN D (ERGOCALCIFEROL) 1.25 MG (50000 UNIT) PO CAPS: 50000.0000 [IU] | ORAL_CAPSULE | ORAL | 0 refills | Status: DC

## 2018-02-16 MED ORDER — TOPIRAMATE 25 MG PO TABS: 25.0000 mg | ORAL_TABLET | Freq: Every evening | ORAL | 0 refills | Status: DC

## 2018-02-16 MED ORDER — SUMATRIPTAN SUCCINATE 100 MG PO TABS: 100.0000 mg | ORAL_TABLET | ORAL | 0 refills | Status: DC | PRN

## 2018-02-16 MED ORDER — FLUTICASONE PROPIONATE 50 MCG/ACT NA SUSP: 2.0000 | Freq: Every day | NASAL | 1 refills | Status: DC

## 2018-02-16 NOTE — Progress Notes (Signed)
Name: Lynn Wilson   MRN: [id number]    DOB: [birth year]   Date:02/16/2018       Progress Note  Subjective  Chief Complaint  Chief Complaint  Patient presents with  . Headache    x 1 week and it has been constant. She has tried treating pain with otc Sinus medication and cold and hot compresses with no fair improvement for a short period of time.    HPI  Headache: she has a history of migraine headaches, but states this symptoms seems different. Pain is worse on left nuchal area, radiating to left frontal area, also has left ear fullness. She is dealing with an URI and also has rhinorrhea, no fever or chills. She has phonophobia but no photophobia. She had nausea two days ago but resolved. She was taking sinus medication and it improved symptoms slightly but does not resolve the pain. No neuro deficit. She used to see neurologist as a child, never been on preventive medication. States goes to [facility] for shots on a regular basis  Iron deficiency anemia: likely from rectal bleeding, seeing GI this week for evaluation. Advised to take ferrous sulfate daily . She states she craves ice.    Patient Active Problem List   Diagnosis Date Noted  . Hyperglycemia 01/29/2018  . Metabolic syndrome 02/77/4128  . Hypertension 01/25/2018  . GERD (gastroesophageal reflux disease) 01/25/2018  . Hypothyroid 01/25/2018  . Morbid obesity ([place]) 01/25/2018    No past surgical history on file.  Family History  Problem Relation Age of Onset  . Hypertension Mother   . Hypertension Father   . Stroke Father     Social History   Socioeconomic History  . Marital status: Single    Spouse name: Curator  . Number of children: 0  . Years of education: Not on file  . Highest education level: Associate degree: occupational, Hotel manager, or vocational program  Social Needs  . Financial resource strain: Somewhat hard  . Food insecurity - worry: Sometimes true  . Food insecurity - inability:  Sometimes true  . Transportation needs - medical: No  . Transportation needs - non-medical: No  Occupational History  . Occupation: Chiropractor: [employer]  Tobacco Use  . Smoking status: Current Every Day Smoker    Packs/day: 0.50    Years: 18.00    Pack years: 9.00    Types: Cigarettes    Start date: 01/26/2000  . Smokeless tobacco: Never Used  . Tobacco comment: contemplating   Substance and Sexual Activity  . Alcohol use: Yes    Alcohol/week: 2.4 oz    Types: 4 Cans of beer per week  . Drug use: No  . Sexual activity: Yes    Birth control/protection: Other-see comments    Comment: homosexual   Other Topics Concern  . Not on file  Social History Narrative   She moved to [place] from [place], [place] in the Summer of 2018   She lives with her girlfriend - homosexual - together for the past 3 years   She does not have any children, but girlfriend has an adult daughter that lives with them.      Current Outpatient Medications:  .  albuterol (PROVENTIL HFA;VENTOLIN HFA) 108 (90 Base) MCG/ACT inhaler, Inhale 1-2 puffs into the lungs every 6 (six) hours as needed for wheezing or shortness of breath., Disp: , Rfl:  .  amLODipine (NORVASC) 5 MG tablet, Take 1 tablet (5 mg total) by mouth daily.,  Disp: 30 tablet, Rfl: 2 .  cholecalciferol (VITAMIN D) 1000 units tablet, Take 2,000 Units by mouth daily., Disp: , Rfl:  .  dicyclomine (BENTYL) 20 MG tablet, Take 1 tablet (20 mg total) by mouth 4 (four) times daily -  before meals and at bedtime. (Patient taking differently: Take 20 mg by mouth as needed. ), Disp: 40 tablet, Rfl: 0 .  hydrocortisone (ANUSOL-HC) 25 MG suppository, Place 1 suppository (25 mg total) rectally 2 (two) times daily. (Patient not taking: Reported on 02/14/2018), Disp: 12 suppository, Rfl: 0 .  levothyroxine (SYNTHROID, LEVOTHROID) 100 MCG tablet, Take 100 mcg by mouth every morning., Disp: , Rfl:  .  ondansetron (ZOFRAN) 4 MG tablet, Take 1 tablet (4 mg total) by  mouth every 8 (eight) hours as needed for nausea or vomiting., Disp: 8 tablet, Rfl: 0 .  pantoprazole (PROTONIX) 40 MG tablet, Take 1 tablet (40 mg total) by mouth daily., Disp: 30 tablet, Rfl: 2 .  Vitamin D, Ergocalciferol, (DRISDOL) 50000 units CAPS capsule, Take 1 capsule (50,000 Units total) by mouth every 7 (seven) days. (Patient not taking: Reported on 02/14/2018), Disp: 12 capsule, Rfl: 0  Allergies  Allergen Reactions  . Celebrex [Celecoxib]     "shakes/tremors"  . Sesame Seed (Diagnostic) Itching  . Lisinopril Rash     ROS  Ten systems reviewed and is negative except as mentioned in HPI   Objective  Vitals:   02/16/18 0937  BP: 140/86  Pulse: 95  Resp: 14  SpO2: 98%  Weight: 239 lb 6.4 oz (108.6 kg)    Body mass index is 41.09 kg/m.  Physical Exam  Constitutional: Patient appears well-developed and well-nourished. Obese  No distress.  HEENT: head atraumatic, normocephalic, pupils equal and reactive to light, ears normal TM, neck supple, throat within normal limits Cardiovascular: Normal rate, regular rhythm and normal heart sounds.  No murmur heard. No BLE edema. Pulmonary/Chest: Effort normal and breath sounds normal. No respiratory distress. Abdominal: Soft.  There is no tenderness. Psychiatric: Patient has a normal mood and affect. behavior is normal. Judgment and thought content normal. Neurological : no focal findings, cranial nerves intact, spasm on left nuchal area  Recent Results (from the past 2160 hour(s))  POCT Urinalysis Dipstick     Status: Abnormal   Collection Time: 01/25/18  4:29 PM  Result Value Ref Range   Color, UA yellow    Clarity, UA clear    Glucose, UA normal    Bilirubin, UA neg    Ketones, UA neg    Spec Grav, UA <=1.005 (A) 1.010 - 1.025   Blood, UA neg    pH, UA 6.0 5.0 - 8.0   Protein, UA neg    Urobilinogen, UA 0.2 0.2 or 1.0 E.U./dL   Nitrite, UA neg    Leukocytes, UA Negative Negative   Appearance normal    Odor normal    Hemoglobin A1c     Status: Abnormal   Collection Time: 01/26/18  8:24 AM  Result Value Ref Range   Hgb A1c MFr Bld 6.0 (H) <5.7 % of total Hgb    Comment: For someone without known diabetes, a hemoglobin  A1c value between 5.7% and 6.4% is consistent with prediabetes and should be confirmed with a  follow-up test. . For someone with known diabetes, a value <7% indicates that their diabetes is well controlled. A1c targets should be individualized based on duration of diabetes, age, comorbid conditions, and other considerations. . This assay result is consistent  with an increased risk of diabetes. . Currently, no consensus exists regarding use of hemoglobin A1c for diagnosis of diabetes for children. .    Mean Plasma Glucose 126 (calc)   eAG (mmol/L) 7.0 (calc)  Lipid panel     Status: Abnormal   Collection Time: 01/26/18  8:24 AM  Result Value Ref Range   Cholesterol 160 <200 mg/dL   HDL 48 (L) >50 mg/dL   Triglycerides 68 <150 mg/dL   LDL Cholesterol (Calc) 97 mg/dL (calc)    Comment: Reference range: <100 . Desirable range <100 mg/dL for primary prevention;   <70 mg/dL for patients with CHD or diabetic patients  with > or = 2 CHD risk factors. Marland Kitchen LDL-C is now calculated using the Martin-Hopkins  calculation, which is a validated novel method providing  better accuracy than the Friedewald equation in the  estimation of LDL-C.  Cresenciano Genre et al. Annamaria Helling. 4287;681(15): 2061-2068  (http://education.[facility].com/faq/FAQ164)    Total CHOL/HDL Ratio 3.3 <5.0 (calc)   Non-HDL Cholesterol (Calc) 112 <130 mg/dL (calc)    Comment: For patients with diabetes plus 1 major ASCVD risk  factor, treating to a non-HDL-C goal of <100 mg/dL  (LDL-C of <70 mg/dL) is considered a therapeutic  option.   CBC with Differential/Platelet     Status: Abnormal   Collection Time: 01/26/18  8:24 AM  Result Value Ref Range   WBC 7.4 3.8 - 10.8 Thousand/uL   RBC 4.88 3.80 - 5.10  Million/uL   Hemoglobin 10.0 (L) 11.7 - 15.5 g/dL   HCT 32.7 (L) 35.0 - 45.0 %   MCV 67.0 (L) 80.0 - 100.0 fL   MCH 20.5 (L) 27.0 - 33.0 pg   MCHC 30.6 (L) 32.0 - 36.0 g/dL   RDW 18.2 (H) 11.0 - 15.0 %   Platelets 429 (H) 140 - 400 Thousand/uL   MPV 10.2 7.5 - 12.5 fL   Neutro Abs 4,248 1,500 - 7,800 cells/uL   Lymphs Abs 2,612 850 - 3,900 cells/uL   WBC mixed population 466 200 - 950 cells/uL   Eosinophils Absolute 67 15 - 500 cells/uL   Basophils Absolute 7 0 - 200 cells/uL   Neutrophils Relative % 57.4 %   Total Lymphocyte 35.3 %   Monocytes Relative 6.3 %   Eosinophils Relative 0.9 %   Basophils Relative 0.1 %  Comprehensive metabolic panel     Status: Abnormal   Collection Time: 01/26/18  8:24 AM  Result Value Ref Range   Glucose, Bld 101 (H) 65 - 99 mg/dL    Comment: .            Fasting reference interval . For someone without known diabetes, a glucose value between 100 and 125 mg/dL is consistent with prediabetes and should be confirmed with a follow-up test. .    BUN 10 7 - 25 mg/dL   Creat 0.72 0.50 - 1.10 mg/dL   BUN/Creatinine Ratio NOT APPLICABLE 6 - 22 (calc)   Sodium 135 135 - 146 mmol/L   Potassium 4.1 3.5 - 5.3 mmol/L   Chloride 108 98 - 110 mmol/L   CO2 20 20 - 32 mmol/L   Calcium 9.0 8.6 - 10.2 mg/dL   Total Protein 7.8 6.1 - 8.1 g/dL   Albumin 4.2 3.6 - 5.1 g/dL   Globulin 3.6 1.9 - 3.7 g/dL (calc)   AG Ratio 1.2 1.0 - 2.5 (calc)   Total Bilirubin 0.3 0.2 - 1.2 mg/dL   Alkaline phosphatase (APISO) 77 33 -  115 U/L   AST 23 10 - 30 U/L   ALT 16 6 - 29 U/L  TSH     Status: None   Collection Time: 01/26/18  8:24 AM  Result Value Ref Range   TSH 2.22 mIU/L    Comment:           Reference Range .           > or = 20 Years  0.40-4.50 .                Pregnancy Ranges           First trimester    0.26-2.66           Second trimester   0.55-2.73           Third trimester    0.43-2.91   Vitamin B12     Status: None   Collection Time: 01/26/18   8:24 AM  Result Value Ref Range   Vitamin B-12 449 200 - 1,100 pg/mL  VITAMIN D 25 Hydroxy (Vit-D Deficiency, Fractures)     Status: Abnormal   Collection Time: 01/26/18  8:24 AM  Result Value Ref Range   Vit D, 25-Hydroxy 10 (L) 30 - 100 ng/mL    Comment: Vitamin D Status         25-OH Vitamin D: . Deficiency:                    <20 ng/mL Insufficiency:             20 - 29 ng/mL Optimal:                 > or = 30 ng/mL . For 25-OH Vitamin D testing on patients on  D2-supplementation and patients for whom quantitation  of D2 and D3 fractions is required, the QuestAssureD(TM) 25-OH VIT D, (D2,D3), LC/MS/MS is recommended: order  code [phone] (patients >57yr). . For more information on this test, go to: http://education.[facility].com/faq/FAQ163 (This link is being provided for  informational/educational purposes only.)   HIV antibody     Status: None   Collection Time: 01/26/18  8:24 AM  Result Value Ref Range   HIV 1&2 Ab, 4th Generation NON-REACTIVE NON-REACTI    Comment: HIV-1 antigen and HIV-1/HIV-2 antibodies were not detected. There is no laboratory evidence of HIV infection. .Marland KitchenPLEASE NOTE: This information has been disclosed to you from records whose confidentiality may be protected by state law.  If your state requires such protection, then the state law prohibits you from making any further disclosure of the information without the specific written consent of the person to whom it pertains, or as otherwise permitted by law. A general authorization for the release of medical or other information is NOT sufficient for this purpose. . For additional information please refer to http://education.[facility].com/faq/FAQ106 (This link is being provided for informational/ educational purposes only.) . .Marland KitchenThe performance of this assay has not been clinically validated in patients less than 250years old. .   RPR     Status: None   Collection Time: 01/26/18   8:24 AM  Result Value Ref Range   RPR Ser Ql NON-REACTIVE NON-REACTI  Hepatitis, Acute     Status: None   Collection Time: 01/26/18  8:24 AM  Result Value Ref Range   Hep A IgM NON-REACTIVE NON-REACTI   Hepatitis B Surface Ag NON-REACTIVE NON-REACTI   Hep B C IgM NON-REACTIVE NON-REACTI   Hepatitis C Ab NON-REACTIVE  NON-REACTI   SIGNAL TO CUT-OFF 0.01 <1.00  CBC MORPHOLOGY     Status: None   Collection Time: 01/26/18  8:24 AM  Result Value Ref Range   CBC MORPHOLOGY  NORMAL    Comment: Crenated red blood cells 2 + Anisocytosis 1 + Microcytosis 1 + Macrocytosis 1 + Poikilocytosis 1 + Hypochromasia 2 +   Urine Culture     Status: None   Collection Time: 01/26/18  8:24 AM  Result Value Ref Range   MICRO NUMBER: [id number]    SPECIMEN QUALITY: ADEQUATE    Sample Source URINE, CLEAN CATCH    STATUS: FINAL    Result:      Multiple organisms present, each less than 10,000 CFU/mL. These organisms, commonly found on external and internal genitalia, are considered to be colonizers. No further testing performed.  C. trachomatis/N. gonorrhoeae RNA     Status: None   Collection Time: 01/26/18  8:24 AM  Result Value Ref Range   C. trachomatis RNA, TMA NOT DETECTED NOT DETECT   N. gonorrhoeae RNA, TMA NOT DETECTED NOT DETECT    Comment: This test was performed using the [place] ([place].). . The analytical performance characteristics of this  assay, when used to test SurePath specimens have been determined by [facility]. .   Iron, TIBC and Ferritin Panel     Status: Abnormal   Collection Time: 01/26/18  8:24 AM  Result Value Ref Range   Iron 28 (L) 40 - 190 mcg/dL   TIBC 456 (H) 250 - 450 mcg/dL (calc)   %SAT 6 (L) 11 - 50 % (calc)   Ferritin 8 (L) 10 - 154 ng/mL  TEST AUTHORIZATION     Status: None   Collection Time: 01/26/18  8:24 AM  Result Value Ref Range   TEST NAME: IRON, TIBC AND FERRITIN PANEL    TEST CODE: [id number]    CLIENT CONTACT: Khaiden Segreto    REPORT ALWAYS MESSAGE SIGNATURE      Comment: . The laboratory testing on this patient was verbally requested or confirmed by the ordering physician or his or her authorized representative after contact with an employee of [employer]. Federal regulations require that we maintain on file written authorization for all laboratory testing.  Accordingly we are asking that the ordering physician or his or her authorized representative sign a copy of this report and promptly return it to the client service representative. . . Signature:____________________________________________________ . Please fax this signed page to [phone] or return it via your [employer] courier.   Urinalysis, Complete w Microscopic     Status: Abnormal   Collection Time: 02/14/18  8:32 AM  Result Value Ref Range   Color, Urine YELLOW (A) YELLOW   APPearance HAZY (A) CLEAR   Specific Gravity, Urine 1.014 1.005 - 1.030   pH 6.0 5.0 - 8.0   Glucose, UA NEGATIVE NEGATIVE mg/dL   Hgb urine dipstick NEGATIVE NEGATIVE   Bilirubin Urine NEGATIVE NEGATIVE   Ketones, ur NEGATIVE NEGATIVE mg/dL   Protein, ur NEGATIVE NEGATIVE mg/dL   Nitrite NEGATIVE NEGATIVE   Leukocytes, UA TRACE (A) NEGATIVE   RBC / HPF 0-5 0 - 5 RBC/hpf   WBC, UA 0-5 0 - 5 WBC/hpf   Bacteria, UA NONE SEEN NONE SEEN   Squamous Epithelial / LPF 6-30 (A) NONE SEEN    Comment: Performed at [facility], [address] [place], [place] [address]  Pregnancy, urine     Status:  None   Collection Time: 02/14/18  8:32 AM  Result Value Ref Range   Preg Test, Ur NEGATIVE NEGATIVE    Comment: Performed at [facility], [address] [place], [place] [address]  Lipase, blood     Status: None   Collection Time: 02/14/18  9:29 AM  Result Value Ref Range   Lipase 28 11 - 51 U/L    Comment: Performed at [facility], [address] [place], [place] [address]  Comprehensive metabolic panel      Status: Abnormal   Collection Time: 02/14/18  9:29 AM  Result Value Ref Range   Sodium 136 135 - 145 mmol/L   Potassium 3.9 3.5 - 5.1 mmol/L   Chloride 105 101 - 111 mmol/L   CO2 21 (L) 22 - 32 mmol/L   Glucose, Bld 108 (H) 65 - 99 mg/dL   BUN 11 6 - 20 mg/dL   Creatinine, Ser 0.65 0.44 - 1.00 mg/dL   Calcium 9.0 8.9 - 10.3 mg/dL   Total Protein 8.2 (H) 6.5 - 8.1 g/dL   Albumin 3.9 3.5 - 5.0 g/dL   AST 32 15 - 41 U/L   ALT 25 14 - 54 U/L   Alkaline Phosphatase 74 38 - 126 U/L   Total Bilirubin 0.4 0.3 - 1.2 mg/dL   GFR calc non Af Amer >60 >60 mL/min   GFR calc Af Amer >60 >60 mL/min    Comment: (NOTE) The eGFR has been calculated using the CKD EPI equation. This calculation has not been validated in all clinical situations. eGFR's persistently <60 mL/min signify possible Chronic Kidney Disease.    Anion gap 10 5 - 15    Comment: Performed at [facility], [address] [place], [place] [address]  CBC     Status: Abnormal   Collection Time: 02/14/18  9:29 AM  Result Value Ref Range   WBC 6.6 3.6 - 11.0 K/uL   RBC 5.01 3.80 - 5.20 MIL/uL   Hemoglobin 10.4 (L) 12.0 - 16.0 g/dL   HCT 33.2 (L) 35.0 - 47.0 %   MCV 66.2 (L) 80.0 - 100.0 fL   MCH 20.8 (L) 26.0 - 34.0 pg   MCHC 31.3 (L) 32.0 - 36.0 g/dL   RDW 19.1 (H) 11.5 - 14.5 %   Platelets 421 150 - 440 K/uL    Comment: Performed at [facility], [address], [place], [place] [address]    PHQ2/9: Depression screen [facility] 2/9 01/25/2018  Decreased Interest 0  Down, Depressed, Hopeless 0  PHQ - 2 Score 0     Fall Risk: Fall Risk  02/16/2018 01/25/2018  Falls in the past year? No No     Functional Status Survey: Is the patient deaf or have difficulty hearing?: No Does the patient have difficulty seeing, even when wearing glasses/contacts?: No Does the patient have difficulty concentrating, remembering, or making decisions?: No Does the patient have difficulty walking or climbing stairs?: No Does  the patient have difficulty dressing or bathing?: No Does the patient have difficulty doing errands alone such as visiting a doctor's office or shopping?: No    Assessment & Plan  1. Intractable migraine with aura with status migrainosus  - topiramate (TOPAMAX) 25 MG tablet; Take 1-4 tablets (25-100 mg total) by mouth every evening. Start with one pill at night first week, second week 2 pills at night, 3rd week 3 pills at night, 4 week 4 pills at night  Dispense: 60 tablet;  Refill: 0 - SUMAtriptan (IMITREX) 100 MG tablet; Take 1 tablet (100 mg total) by mouth every 2 (two) hours as needed for migraine. May repeat in 2 hours if headache persists or recurs.  Dispense: 10 tablet; Refill: 0 - baclofen (LIORESAL) 20 MG tablet; Take 1 tablet (20 mg total) by mouth 3 (three) times daily. Prn migraine  Dispense: 30 each; Refill: 0  Discussed possibles side effects of medication, she is homosexual and no risk of being pregnant   2. Rectal bleeding  Keep follow up with GI  3. Iron deficiency anemia due to chronic blood loss  Needs to take ferrous sulfate 325 mg three times daily   4. Acute non-recurrent maxillary sinusitis  - amoxicillin-clavulanate (AUGMENTIN) 875-125 MG tablet; Take 1 tablet by mouth 2 (two) times daily.  Dispense: 20 tablet; Refill: 0  - fluticasone (FLONASE) 50 MCG/ACT nasal spray; Place 2 sprays into both nostrils daily.  Dispense: 16 g; Refill: 1

## 2018-02-17 ENCOUNTER — Telehealth: Payer: Self-pay | Admitting: Family Medicine

## 2018-02-17 NOTE — Telephone Encounter (Signed)
Copied from [place]. Topic: Quick Communication - See Telephone Encounter >> Feb 17, 2018  2:07 PM Lolita Rieger, RMA wrote: CRM for notification. See Telephone encounter for:   02/17/18.Christie from [facility] on [address] called and stated that the quantity for the topamax 25 mg taper needs to be 70 she said you can either e-scribe  a new rx or call in additional 10 tabs

## 2018-02-18 ENCOUNTER — Ambulatory Visit: Payer: BLUE CROSS/BLUE SHIELD | Admitting: Gastroenterology

## 2018-02-18 ENCOUNTER — Encounter: Payer: Self-pay | Admitting: Gastroenterology

## 2018-02-18 ENCOUNTER — Other Ambulatory Visit: Payer: Self-pay | Admitting: Family Medicine

## 2018-02-18 ENCOUNTER — Other Ambulatory Visit: Payer: Self-pay

## 2018-02-18 ENCOUNTER — Other Ambulatory Visit
Admission: RE | Admit: 2018-02-18 | Discharge: 2018-02-18 | Disposition: A | Discharge status: Home / Self Care | Payer: BLUE CROSS/BLUE SHIELD | Source: Ambulatory Visit | Attending: Gastroenterology | Admitting: Gastroenterology

## 2018-02-18 VITALS — BP 135/92 | HR 72 | Ht 64.0 in | Wt 238.6 lb

## 2018-02-18 DIAGNOSIS — D500 Iron deficiency anemia secondary to blood loss (chronic): Secondary | ICD-10-CM | POA: Insufficient documentation

## 2018-02-18 DIAGNOSIS — F1990 Other psychoactive substance use, unspecified, uncomplicated: Secondary | ICD-10-CM

## 2018-02-18 DIAGNOSIS — G43111 Migraine with aura, intractable, with status migrainosus: Secondary | ICD-10-CM

## 2018-02-18 DIAGNOSIS — K5730 Diverticulosis of large intestine without perforation or abscess without bleeding: Secondary | ICD-10-CM | POA: Diagnosis not present

## 2018-02-18 DIAGNOSIS — K219 Gastro-esophageal reflux disease without esophagitis: Secondary | ICD-10-CM

## 2018-02-18 DIAGNOSIS — K449 Diaphragmatic hernia without obstruction or gangrene: Secondary | ICD-10-CM

## 2018-02-18 DIAGNOSIS — K625 Hemorrhage of anus and rectum: Secondary | ICD-10-CM

## 2018-02-18 DIAGNOSIS — K648 Other hemorrhoids: Secondary | ICD-10-CM | POA: Diagnosis not present

## 2018-02-18 LAB — CBC WITH DIFFERENTIAL/PLATELET
Basophils Absolute: 0 10*3/uL (ref 0–0.1)
Basophils Relative: 1 %
Eosinophils Absolute: 0.1 10*3/uL (ref 0–0.7)
Eosinophils Relative: 1 %
HCT: 32.2 % — ABNORMAL LOW (ref 35.0–47.0)
Hemoglobin: 10.1 g/dL — ABNORMAL LOW (ref 12.0–16.0)
Lymphocytes Relative: 31 %
Lymphs Abs: 2.1 10*3/uL (ref 1.0–3.6)
MCH: 20.6 pg — ABNORMAL LOW (ref 26.0–34.0)
MCHC: 31.5 g/dL — ABNORMAL LOW (ref 32.0–36.0)
MCV: 65.5 fL — ABNORMAL LOW (ref 80.0–100.0)
Monocytes Absolute: 0.5 10*3/uL (ref 0.2–0.9)
Monocytes Relative: 7 %
Neutro Abs: 3.9 10*3/uL (ref 1.4–6.5)
Neutrophils Relative %: 60 %
Platelets: 366 10*3/uL (ref 150–440)
RBC: 4.92 MIL/uL (ref 3.80–5.20)
RDW: 18.7 % — ABNORMAL HIGH (ref 11.5–14.5)
WBC: 6.5 10*3/uL (ref 3.6–11.0)

## 2018-02-18 LAB — IRON AND TIBC
Iron: 36 ug/dL (ref 28–170)
Saturation Ratios: 7 % — ABNORMAL LOW (ref 10.4–31.8)
TIBC: 489 ug/dL — ABNORMAL HIGH (ref 250–450)
UIBC: 453 ug/dL

## 2018-02-18 LAB — FOLATE: Folate: 11.3 ng/mL (ref >5.9)

## 2018-02-18 LAB — VITAMIN B12: Vitamin B-12: 368 pg/mL (ref 180–914)

## 2018-02-18 LAB — FERRITIN: Ferritin: 4 ng/mL — ABNORMAL LOW (ref 11–307)

## 2018-02-18 MED ORDER — TOPIRAMATE 25 MG PO TABS: 25.0000 mg | ORAL_TABLET | Freq: Every evening | ORAL | 0 refills | Status: DC

## 2018-02-18 NOTE — Telephone Encounter (Signed)
Faxed to [facility] on [address].

## 2018-02-18 NOTE — Telephone Encounter (Signed)
Copied from [place]. Topic: Quick Communication - See Telephone Encounter >> Feb 17, 2018  2:07 PM Lolita Rieger, RMA wrote: CRM for notification. See Telephone encounter for:   02/17/18.Christie from [facility] on [address] called and stated that the quantity for the topamax 25 mg taper needs to be 70 she said you can either e-scribe  a new rx or call in additional 10 tabs

## 2018-02-18 NOTE — Telephone Encounter (Signed)
Changed to 70. Please fax, direction is too long to go through e-rx

## 2018-02-18 NOTE — Progress Notes (Signed)
Cephas Darby, MD [address]  [place], [place] [address]  Main: [phone]  Fax: [phone]    Gastroenterology Consultation  Referring Provider:     Steele Sizer, MD Primary Care Physician:  Steele Sizer, MD Primary Gastroenterologist:  Dr. Cephas Darby Reason for Consultation:     Iron deficiency anemia, rectal bleeding        HPI:   PAIJE GOODHART is a 37 y.o. African-American female  With history of morbid obesity, hypothyroidism, and hypertension referred by Dr. Steele Sizer, MD  for consultation & management of severe iron deficiency anemia, Chronic rectal bleeding. She was in the emergency room last week secondary to left-sided abdominal pain, nausea, vomiting, diarrhea, mild rectal bleeding and underwent CT which was unremarkable other than sigmoid diverticulosis. Apparently, patient has chronic history of intermittent episodes of rectal bleeding, loose stool with dark red blood clots, left lower quadrant pain, every other month. She underwent upper endoscopy and colonoscopy in [place] in 2015 and was found to have sigmoid diverticulosis, internal hemorrhoids, hiatal hernia. She has developed iron deficiency anemia since 04/2017. Her lowest hemoglobin was 9.9, most recently 10.4, MCV 66.2, ferritin 8 in 01/2018. She saw Dr. Ancil Boozer last week who started her on oral iron supplementation, she is currently taking 1 pill a day. And, she is also taking Protonix once a day. Patient has been taking Excedrin for chronic migraine headaches for last 3-4 years, about 3-4 pills daily. She just recently started on Topamax for migraine headaches by Dr. Ancil Boozer. she had history of elevated transaminases AST greater than AST in 04/2017 which are now back to normal. This was probably in the setting of alcohol use or symptomatic cholelithiasis. She reports feeling tired, fatigued secondary to anemia. She denies active rectal bleeding, abdominal pain, nausea, vomiting,  diarrhea  NSAIDs: Excedrin, 3-4 pills daily for chronic headaches for last 3-4 years  Antiplts/Anticoagulants/Anti thrombotics: none  GI Procedures:  Repeat EGD and colonoscopy in [place], in 2015 was told that she has hiatal hernia, diverticulosis, internal hemorrhoids Report not available Pathology from gastric biopsies was negative for H. Pylori gastritis. She denies family history of GI malignancy She denies abdominal surgeries She smokes cigarettes, drinks about 4 beers per week She denies IV drug abuse  Past Medical History:  Diagnosis Date  . GERD (gastroesophageal reflux disease)   . Hypertension   . Hypothyroid   . Hypothyroid 01/25/2018    No past surgical history on file.  Prior to Admission medications   Medication Sig Start Date End Date Taking? Authorizing Provider  albuterol (PROVENTIL HFA;VENTOLIN HFA) 108 (90 Base) MCG/ACT inhaler Inhale 1-2 puffs into the lungs every 6 (six) hours as needed for wheezing or shortness of breath.    [provider]  amLODipine (NORVASC) 5 MG tablet Take 1 tablet (5 mg total) by mouth daily. 01/25/18   Steele Sizer, MD  amoxicillin-clavulanate (AUGMENTIN) 875-125 MG tablet Take 1 tablet by mouth 2 (two) times daily. 02/16/18   Steele Sizer, MD  baclofen (LIORESAL) 20 MG tablet Take 1 tablet (20 mg total) by mouth 3 (three) times daily. Prn migraine 02/16/18   Steele Sizer, MD  cholecalciferol (VITAMIN D) 1000 units tablet Take 2,000 Units by mouth daily.    [provider]  dicyclomine (BENTYL) 20 MG tablet Take 1 tablet (20 mg total) by mouth 4 (four) times daily -  before meals and at bedtime. Patient taking differently: Take 20 mg by mouth as needed.  08/30/15   Rolland Porter, MD  ferrous sulfate 325 (65 FE) MG tablet Take 1 tablet (325 mg total) by mouth daily with breakfast. 02/16/18   Ancil Boozer, Drue Stager, MD  fluticasone (FLONASE) 50 MCG/ACT nasal spray Place 2 sprays into both nostrils daily. 02/16/18   Steele Sizer, MD  hydrocortisone (ANUSOL-HC) 25 MG suppository Place 1 suppository (25 mg total) rectally 2 (two) times daily. Patient not taking: Reported on 02/14/2018 08/30/15   Rolland Porter, MD  levothyroxine (SYNTHROID, LEVOTHROID) 100 MCG tablet Take 100 mcg by mouth every morning.    [provider]  ondansetron (ZOFRAN) 4 MG tablet Take 1 tablet (4 mg total) by mouth every 8 (eight) hours as needed for nausea or vomiting. 02/14/18   Schuyler Amor, MD  pantoprazole (PROTONIX) 40 MG tablet Take 1 tablet (40 mg total) by mouth daily. 01/25/18   Steele Sizer, MD  SUMAtriptan (IMITREX) 100 MG tablet Take 1 tablet (100 mg total) by mouth every 2 (two) hours as needed for migraine. May repeat in 2 hours if headache persists or recurs. 02/16/18   Steele Sizer, MD  topiramate (TOPAMAX) 25 MG tablet Take 1-4 tablets (25-100 mg total) by mouth every evening. Start with one pill at night first week, second week 2 pills at night, 3rd week 3 pills at night, 4 week 4 pills at night 02/16/18   Steele Sizer, MD  Vitamin D, Ergocalciferol, (DRISDOL) 50000 units CAPS capsule Take 1 capsule (50,000 Units total) by mouth every 7 (seven) days. 02/16/18   Steele Sizer, MD    Family History  Problem Relation Age of Onset  . Hypertension Mother   . Hypertension Father   . Stroke Father      Social History   Tobacco Use  . Smoking status: Current Every Day Smoker    Packs/day: 0.50    Years: 18.00    Pack years: 9.00    Types: Cigarettes    Start date: 01/26/2000  . Smokeless tobacco: Never Used  . Tobacco comment: contemplating   Substance Use Topics  . Alcohol use: Yes    Alcohol/week: 2.4 oz    Types: 4 Cans of beer per week  . Drug use: No    Allergies as of 02/18/2018 - Review Complete 02/18/2018  Allergen Reaction Noted  . Celebrex [celecoxib]  03/08/2012  . Sesame seed (diagnostic) Itching 01/25/2018  . Lisinopril Rash 03/08/2012    Review of Systems:    All systems reviewed and  negative except where noted in HPI.   Physical Exam:  BP (!) 135/92   Pulse 72   Ht 5\' 4"  (6.761 m)   Wt 238 lb 9.6 oz (108.2 kg)   LMP 02/07/2018   BMI 40.96 kg/m  Patient's last menstrual period was 02/07/2018.  General:   Alert,  Well-developed, well-nourished, pleasant and cooperative in NAD Head:  Normocephalic and atraumatic. Eyes:  Sclera clear, no icterus.   Conjunctiva pink. Ears:  Normal auditory acuity. Nose:  No deformity, discharge, or lesions. Mouth:  No deformity or lesions,oropharynx pink & moist. Neck:  Supple; no masses or thyromegaly. Lungs:  Respirations even and unlabored.  Clear throughout to auscultation.   No wheezes, crackles, or rhonchi. No acute distress. Heart:  Regular rate and rhythm; no murmurs, clicks, rubs, or gallops. Abdomen:  Normal bowel sounds. Soft, obesity,non-tender and non-distended,  No guarding or rebound tenderness.   Rectal: Not performed Msk:  Symmetrical without gross deformities. Good, equal movement & strength bilaterally. Pulses:  Normal  pulses noted. Extremities:  No clubbing or edema.  No cyanosis. Neurologic:  Alert and oriented x3;  grossly normal neurologically. Skin:  Intact without significant lesions or rashes. No jaundice. Psych:  Alert and cooperative. Normal mood and affect.  Imaging Studies: Reviewed   Assessment and Plan:   NHI BUTRUM is a 37 y.o. African-American female with metabolic syndrome presents with chronic history of intermittent episodes of left lower quadrant pain, rectal bleeding since 2015, heavy NSAID use for chronic headaches, found to have severe iron deficiency anemia. She had EGD and colonoscopy in 2015, there was no evidence of peptic ulcer disease. She had a hiatal hernia, sigmoid diverticulosis and internal hemorrhoids. The etiology of iron deficiency anemia is multifactorial including NSAID-induced enteropathy, NSAID-induced gastritis, recurrent diverticular bleed in the setting of heavy  Excedrin use, bleeding internal hemorrhoids  My recommendations are - stop Excedrin and avoid any other NSAIDs, okay to take Tylenol 650 mg 2-3 times a day - Continue oral iron replacement - increase Protonix to 40 mg twice daily - Referral to cancer Center for IV iron - Recheck CBC, ferritin, iron panel, B12, folate - if patient continues to have persistent iron deficiency anemia, I'll perform EGD and colonoscopy +/- VCE   Follow up in 4 weeks   Cephas Darby, MD

## 2018-02-25 ENCOUNTER — Other Ambulatory Visit: Payer: Self-pay

## 2018-02-25 ENCOUNTER — Telehealth: Payer: Self-pay | Admitting: Family Medicine

## 2018-02-25 MED ORDER — LEVOTHYROXINE SODIUM 100 MCG PO TABS: 100.0000 ug | ORAL_TABLET | ORAL | 0 refills | Status: DC

## 2018-02-25 NOTE — Telephone Encounter (Signed)
Refill request for thyroid medication: Levothyroxine 100 mcg  Last Physical: None indicated   Lab Results  Component Value Date   TSH 2.22 01/26/2018    No Follow-up on file. 03/10/2018

## 2018-02-25 NOTE — Telephone Encounter (Signed)
Pt is almost out of rx Protonic she was told to let Sharyn Lull know when she is almost out she uses [facility] [address] please call in rx and call pt back

## 2018-02-25 NOTE — Telephone Encounter (Signed)
Sent request to PCP to sign.

## 2018-02-25 NOTE — Telephone Encounter (Signed)
Copied from [place] [id number]. Topic: Quick Communication - Rx Refill/Question >> Feb 25, 2018  9:59 AM Ahmed Prima L wrote: Medication:  levothyroxine (SYNTHROID, LEVOTHROID) 100 MCG tablet  Has the patient contacted their pharmacy? Yes on monday  (Agent: If no, request that the patient contact the pharmacy for the refill.)   Preferred Pharmacy (with phone number or street name): levothyroxine (SYNTHROID, LEVOTHROID) 100 MCG tablet   Agent: Please be advised that RX refills may take up to 3 business days. We ask that you follow-up with your pharmacy.

## 2018-03-02 ENCOUNTER — Inpatient Hospital Stay: Payer: BLUE CROSS/BLUE SHIELD | Attending: Oncology | Admitting: Oncology

## 2018-03-02 ENCOUNTER — Other Ambulatory Visit: Payer: Self-pay

## 2018-03-02 ENCOUNTER — Encounter: Payer: Self-pay | Admitting: Oncology

## 2018-03-02 VITALS — BP 149/90 | HR 80 | Temp 96.8°F | Resp 18 | Wt 238.4 lb

## 2018-03-02 DIAGNOSIS — N924 Excessive bleeding in the premenopausal period: Secondary | ICD-10-CM | POA: Insufficient documentation

## 2018-03-02 DIAGNOSIS — D500 Iron deficiency anemia secondary to blood loss (chronic): Secondary | ICD-10-CM

## 2018-03-02 DIAGNOSIS — K2950 Unspecified chronic gastritis without bleeding: Secondary | ICD-10-CM

## 2018-03-02 NOTE — Progress Notes (Signed)
Hematology/Oncology Consult note [facility] [phone] [phone]   Patient Care Team: Steele Sizer, MD as PCP - General (Family Medicine) Steele Sizer, MD (Family Medicine)  REFERRING PROVIDER: Dr.Vanga Rohini CHIEF COMPLAINTS/PURPOSE OF CONSULTATION:  Evaluation of iron deficiency anemia.   HISTORY OF PRESENTING ILLNESS:  Lynn Wilson is a  37 y.o.  female with PMH listed below who was referred to me for evaluation of iron deficiency anemia.  Patient was recently seen by gastroenterologist for consultation and management of severe iron deficiency anemia, chronic rectal bleeding.  She has had CT of the abdomen done which was unremarkable other than sigmoid diverticulosis.  She also has a history of intermittent rectal bleeding, diarrhea, dark stools, chronic NSAIDs use for headache which was recently stopped after GI visit. NSAIDs: Excedrin, 3-4 pills daily for chronic headaches for last 3-4 years Antiplts/Anticoagulants/Anti thrombotics: none She denies family history of GI malignancy Patient also reports heavy menstrual bleeding.  She has tried oral iron supplementations in the past. Patient has chronic anemia, most recent labs on February 18, 2018 showed hemoglobin 10.1, MCV 65.5, normal platelets and normal white blood cell count with normal differentials. Iron panel was obtained on same day which showed elevated TIBC at 489, decreased saturation ratio and 7%, decreased ferritin level at 4. Patient reports feeling tired, lack of energy.  Craving ice chips.  Intermittent abdominal pain.  Today she also has onset of menstrual and has a lot of clots.  Review of Systems  Constitutional: Positive for malaise/fatigue. Negative for chills, fever and weight loss.  HENT: Negative for ear discharge and ear pain.   Eyes: Negative for double vision and photophobia.  Respiratory: Negative for cough.   Cardiovascular: Negative for chest pain  and claudication.  Gastrointestinal: Positive for abdominal pain, blood in stool and diarrhea. Negative for nausea and vomiting.  Genitourinary: Negative for dysuria.  Skin: Negative for rash.  Neurological: Negative for dizziness.  Endo/Heme/Allergies: Does not bruise/bleed easily.  Psychiatric/Behavioral: Negative for depression and memory loss.    MEDICAL HISTORY:  Past Medical History:  Diagnosis Date  . GERD (gastroesophageal reflux disease)   . Hypertension   . Hypothyroid   . Hypothyroid 01/25/2018    SURGICAL HISTORY: History reviewed. No pertinent surgical history.  SOCIAL HISTORY: Social History   Socioeconomic History  . Marital status: Single    Spouse name: Curator  . Number of children: 0  . Years of education: Not on file  . Highest education level: Associate degree: occupational, Hotel manager, or vocational program  Social Needs  . Financial resource strain: Somewhat hard  . Food insecurity - worry: Sometimes true  . Food insecurity - inability: Sometimes true  . Transportation needs - medical: No  . Transportation needs - non-medical: No  Occupational History  . Occupation: Chiropractor: [employer]  Tobacco Use  . Smoking status: Current Every Day Smoker    Packs/day: 0.50    Years: 18.00    Pack years: 9.00    Types: Cigarettes    Start date: 01/26/2000  . Smokeless tobacco: Never Used  . Tobacco comment: contemplating   Substance and Sexual Activity  . Alcohol use: Yes    Alcohol/week: 2.4 oz    Types: 4 Cans of beer per week  . Drug use: No  . Sexual activity: Yes    Birth control/protection: Other-see comments    Comment: homosexual   Other Topics Concern  . Not on file  Social  History Narrative   She moved to [place] from [place], [place] in the Summer of 2018   She lives with her girlfriend - homosexual - together for the past 3 years   She does not have any children, but girlfriend has an adult daughter that lives with them.       FAMILY HISTORY: Family History  Problem Relation Age of Onset  . Hypertension Mother   . Hypertension Father   . Stroke Father     ALLERGIES:  is allergic to celebrex [celecoxib]; sesame seed (diagnostic); and lisinopril.  MEDICATIONS:  Current Outpatient Medications  Medication Sig Dispense Refill  . albuterol (PROVENTIL HFA;VENTOLIN HFA) 108 (90 Base) MCG/ACT inhaler Inhale 1-2 puffs into the lungs every 6 (six) hours as needed for wheezing or shortness of breath.    Marland Kitchen amLODipine (NORVASC) 5 MG tablet Take 1 tablet (5 mg total) by mouth daily. 30 tablet 2  . baclofen (LIORESAL) 20 MG tablet Take 1 tablet (20 mg total) by mouth 3 (three) times daily. Prn migraine 30 each 0  . cholecalciferol (VITAMIN D) 1000 units tablet Take 2,000 Units by mouth daily.    Marland Kitchen dicyclomine (BENTYL) 20 MG tablet Take 1 tablet (20 mg total) by mouth 4 (four) times daily -  before meals and at bedtime. (Patient taking differently: Take 20 mg by mouth as needed. ) 40 tablet 0  . ferrous sulfate 325 (65 FE) MG tablet Take 1 tablet (325 mg total) by mouth daily with breakfast. 90 tablet 3  . fluticasone (FLONASE) 50 MCG/ACT nasal spray Place 2 sprays into both nostrils daily. 16 g 1  . levothyroxine (SYNTHROID, LEVOTHROID) 100 MCG tablet Take 1 tablet (100 mcg total) by mouth every morning. 90 tablet 0  . pantoprazole (PROTONIX) 40 MG tablet Take 1 tablet (40 mg total) by mouth daily. 30 tablet 2  . SUMAtriptan (IMITREX) 100 MG tablet Take 1 tablet (100 mg total) by mouth every 2 (two) hours as needed for migraine. May repeat in 2 hours if headache persists or recurs. 10 tablet 0  . topiramate (TOPAMAX) 25 MG tablet Take 1-4 tablets (25-100 mg total) by mouth every evening. Start with one pill at night first week, second week 2 pills at night, 3rd week 3 pills at night, 4 week 4 pills at night 70 tablet 0  . Vitamin D, Ergocalciferol, (DRISDOL) 50000 units CAPS capsule Take 1 capsule (50,000 Units total) by  mouth every 7 (seven) days. 12 capsule 0  . amoxicillin-clavulanate (AUGMENTIN) 875-125 MG tablet Take 1 tablet by mouth 2 (two) times daily. (Patient not taking: Reported on 03/02/2018) 20 tablet 0  . ondansetron (ZOFRAN) 4 MG tablet Take 1 tablet (4 mg total) by mouth every 8 (eight) hours as needed for nausea or vomiting. (Patient not taking: Reported on 03/02/2018) 8 tablet 0   No current facility-administered medications for this visit.      PHYSICAL EXAMINATION: ECOG PERFORMANCE STATUS: 1 - Symptomatic but completely ambulatory Vitals:   03/02/18 1510  BP: (!) 149/90  Pulse: 80  Resp: 18  Temp: (!) 96.8 F (36 C)   Filed Weights   03/02/18 1510  Weight: 238 lb 7 oz (108.2 kg)    Physical Exam  Constitutional: She is oriented to person, place, and time and well-developed, well-nourished, and in no distress. No distress.  HENT:  Head: Normocephalic and atraumatic.  Mouth/Throat: No oropharyngeal exudate.  Eyes: EOM are normal. Pupils are equal, round, and reactive to light. No scleral icterus.  Neck: Normal range of motion. Neck supple.  Cardiovascular: Normal rate and normal heart sounds.  No murmur heard. Pulmonary/Chest: Effort normal and breath sounds normal.  Abdominal: Soft. Bowel sounds are normal. She exhibits no distension. There is no tenderness.  Musculoskeletal: She exhibits no edema.  Lymphadenopathy:    She has no cervical adenopathy.  Neurological: She is alert and oriented to person, place, and time. No cranial nerve deficit.  Skin: Skin is warm and dry. No erythema.  Psychiatric: Affect and judgment normal.     LABORATORY DATA:  I have reviewed the data as listed Lab Results  Component Value Date   WBC 6.5 02/18/2018   HGB 10.1 (L) 02/18/2018   HCT 32.2 (L) 02/18/2018   MCV 65.5 (L) 02/18/2018   PLT 366 02/18/2018   Recent Labs    04/27/17 1612 07/04/17 1435 01/26/18 0824 02/14/18 0929  NA 136 137 135 136  K 3.8 3.7 4.1 3.9  CL 107 105  108 105  CO2 22 23 20  21*  GLUCOSE 92 92 101* 108*  BUN 9 13 10 11   CREATININE 0.73 0.78 0.72 0.65  CALCIUM 8.6* 9.0 9.0 9.0  GFRNONAA >60 >60  --  >60  GFRAA >60 >60  --  >60  PROT 8.1 8.3* 7.8 8.2*  ALBUMIN 3.9 4.1  --  3.9  AST 368* 42* 23 32  ALT 195* 39 16 25  ALKPHOS 182* 90  --  74  BILITOT 0.6 0.4 0.3 0.4       ASSESSMENT & PLAN:  1. Iron deficiency anemia due to chronic blood loss   2. Chronic gastritis, presence of bleeding unspecified, unspecified gastritis type   3. Excessive bleeding in premenopausal period    # Lab results were discussed with patient, she has severe iron deficiency due to chronic blood loss from GI tract, possibly from menorrhagia as well.Plan IV iron with Venofer 200mg  twice a week for 4 doses. Allergy reactions/infusion reaction including anaphylactic reaction discussed with patient. Patient voices understanding and willing to proceed.  #Avoid NSAIDs, continue Protonix 40 mg twice daily. #Continue follow-up with gastroenterology. #Will discuss about possible GYN workup if persistently iron deficient despite IV iron after GI workup. All questions were answered. The patient knows to call the clinic with any problems questions or concerns.  Return of visit: 4 week to repeat labs and possible additional venofer  Thank you for this kind referral and the opportunity to participate in the care of this patient. A copy of today's note is routed to referring provider    Earlie Server, MD, PhD Hematology Oncology [facility] at [facility] Pager- [phone] 03/02/2018

## 2018-03-02 NOTE — Progress Notes (Signed)
New patient in today to see Dr Tasia Catchings for iron deficiency anemia.

## 2018-03-04 ENCOUNTER — Other Ambulatory Visit: Payer: Self-pay | Admitting: Oncology

## 2018-03-04 ENCOUNTER — Inpatient Hospital Stay: Payer: BLUE CROSS/BLUE SHIELD

## 2018-03-04 VITALS — BP 116/79 | HR 73 | Temp 96.6°F | Resp 20

## 2018-03-04 DIAGNOSIS — D500 Iron deficiency anemia secondary to blood loss (chronic): Secondary | ICD-10-CM

## 2018-03-04 MED ORDER — DIPHENHYDRAMINE HCL 25 MG PO CAPS
25.0000 mg | ORAL_CAPSULE | Freq: Once | ORAL | Status: AC
  Administered 2018-03-04: 25 mg via ORAL

## 2018-03-04 MED ORDER — DIPHENHYDRAMINE HCL 25 MG PO CAPS
ORAL_CAPSULE | ORAL | Status: AC
  Filled 2018-03-04: qty 1

## 2018-03-04 MED ORDER — SODIUM CHLORIDE 0.9 % IV SOLN
Freq: Once | INTRAVENOUS | Status: AC
  Administered 2018-03-04: 14:00:00 via INTRAVENOUS
  Filled 2018-03-04: qty 1000

## 2018-03-04 MED ORDER — IRON SUCROSE 20 MG/ML IV SOLN
200.0000 mg | Freq: Once | INTRAVENOUS | Status: AC
  Administered 2018-03-04: 200 mg via INTRAVENOUS
  Filled 2018-03-04: qty 10

## 2018-03-04 MED ORDER — DIPHENHYDRAMINE HCL 25 MG PO CAPS
25.0000 mg | ORAL_CAPSULE | Freq: Once | ORAL | Status: DC
  Filled 2018-03-04: qty 1

## 2018-03-04 NOTE — Progress Notes (Signed)
Venofer infusion complete. Patient states, "My back and feet are itching. I have really dry skin and do itch at home sometimes." Vital signs stable. MD, Dr. Tasia Catchings, notified via telephone. Per MD order: Benadryl 25mg  PO once order placed, and keep patient for further observation. See MAR. MD coming to evaluate patient in 20 minutes.  1520- MD, Dr. Tasia Catchings, present at chair side. Per MD order: keep patient another 30 minutes and if all symptoms are resolved, patient may be discharged to home.  1600- Vital signs remain stable and all symptoms resolved. Patient states, "I am not itching anymore and I feel better. I am okay to drive home." Patient discharged to home at this time.

## 2018-03-07 ENCOUNTER — Telehealth: Payer: Self-pay | Admitting: Gastroenterology

## 2018-03-07 NOTE — Telephone Encounter (Signed)
Pt is calling to get a note for restriction of heavy lifting due to her Hernia, she states her job requires her to lift heavy objects that are causing her left pain.  cb [phone]

## 2018-03-08 ENCOUNTER — Inpatient Hospital Stay: Payer: BLUE CROSS/BLUE SHIELD

## 2018-03-08 VITALS — BP 120/78 | HR 74 | Temp 97.1°F | Resp 20

## 2018-03-08 DIAGNOSIS — D500 Iron deficiency anemia secondary to blood loss (chronic): Secondary | ICD-10-CM

## 2018-03-08 MED ORDER — SODIUM CHLORIDE 0.9 % IV SOLN
Freq: Once | INTRAVENOUS | Status: AC
  Administered 2018-03-08: 14:00:00 via INTRAVENOUS
  Filled 2018-03-08: qty 1000

## 2018-03-08 MED ORDER — IRON SUCROSE 20 MG/ML IV SOLN
200.0000 mg | Freq: Once | INTRAVENOUS | Status: AC
  Administered 2018-03-08: 200 mg via INTRAVENOUS
  Filled 2018-03-08: qty 10

## 2018-03-08 MED ORDER — DIPHENHYDRAMINE HCL 25 MG PO CAPS
25.0000 mg | ORAL_CAPSULE | Freq: Once | ORAL | Status: AC
  Administered 2018-03-08: 25 mg via ORAL
  Filled 2018-03-08: qty 1

## 2018-03-09 ENCOUNTER — Telehealth: Payer: Self-pay

## 2018-03-09 NOTE — Telephone Encounter (Signed)
Patient has contacted our office to request a limited restriction work note due to hernia.  I've asked her to contact her PCP to discuss this matter because it is not a GI related problem.  Thanks [employer]

## 2018-03-09 NOTE — Telephone Encounter (Signed)
I saw her for headaches, no records of hernia

## 2018-03-10 ENCOUNTER — Ambulatory Visit: Payer: BLUE CROSS/BLUE SHIELD | Admitting: Family Medicine

## 2018-03-10 ENCOUNTER — Encounter: Payer: Self-pay | Admitting: Family Medicine

## 2018-03-10 VITALS — BP 134/88 | HR 83 | Resp 14 | Ht 64.0 in | Wt 234.8 lb

## 2018-03-10 DIAGNOSIS — G43009 Migraine without aura, not intractable, without status migrainosus: Secondary | ICD-10-CM

## 2018-03-10 DIAGNOSIS — D500 Iron deficiency anemia secondary to blood loss (chronic): Secondary | ICD-10-CM

## 2018-03-10 DIAGNOSIS — I10 Essential (primary) hypertension: Secondary | ICD-10-CM | POA: Diagnosis not present

## 2018-03-10 NOTE — Telephone Encounter (Signed)
Informed patient via voicemail she was seen for a headache and never discussed a hernia or record of it. So Dr. Ancil Boozer is unable to write a letter for work. Patient will have to be seen for a note for work.

## 2018-03-10 NOTE — Progress Notes (Signed)
Name: Lynn Wilson   MRN: [id number]    DOB: [birth year]   Date:03/10/2018       Progress Note  Subjective  Chief Complaint  Chief Complaint  Patient presents with  . Migraine    HPI   Migraine headaches/iron deficiency anemia: she was seen a few weeks ago with worsening of headaches, she was given Imitrex to take prn and also Topamax for prevention, she states no more headache and heavy feeling or pica since she had iron infusion. Feeling better now. She had no episodes of headache since last visit , she is going back tomorrow for 3rd infusion. Advised her to consider hemorrhoidectomy and keep follow up with gyn to treat menorrhagia   HTN: DBP was elevated when she first came in, we will recheck now. No chest pain or palpitation.    ? Hiatal hernia: she states diagnosed by [facility] in [place], explained what hiatal hernia is. Explained that is no records here so I cannot send a note to her work about it.   Patient Active Problem List   Diagnosis Date Noted  . Intractable migraine with aura with status migrainosus 02/16/2018  . Iron deficiency anemia due to chronic blood loss 02/16/2018  . Hyperglycemia 01/29/2018  . Metabolic syndrome 35/68/6168  . Hypertension 01/25/2018  . GERD (gastroesophageal reflux disease) 01/25/2018  . Hypothyroid 01/25/2018  . Morbid obesity ([place]) 01/25/2018    History reviewed. No pertinent surgical history.  Family History  Problem Relation Age of Onset  . Hypertension Mother   . Hypertension Father   . Stroke Father     Social History   Socioeconomic History  . Marital status: Single    Spouse name: Curator  . Number of children: 0  . Years of education: Not on file  . Highest education level: Associate degree: occupational, Hotel manager, or vocational program  Occupational History  . Occupation: Chiropractor: [employer]  Social Needs  . Financial resource strain: Somewhat hard  . Food insecurity:    Worry: Sometimes true     Inability: Sometimes true  . Transportation needs:    Medical: No    Non-medical: No  Tobacco Use  . Smoking status: Current Every Day Smoker    Packs/day: 0.50    Years: 18.00    Pack years: 9.00    Types: Cigarettes    Start date: 01/26/2000  . Smokeless tobacco: Never Used  . Tobacco comment: contemplating   Substance and Sexual Activity  . Alcohol use: Yes    Alcohol/week: 2.4 oz    Types: 4 Cans of beer per week  . Drug use: No  . Sexual activity: Yes    Birth control/protection: Other-see comments    Comment: homosexual   Lifestyle  . Physical activity:    Days per week: 0 days    Minutes per session: 0 min  . Stress: Very much  Relationships  . Social connections:    Talks on phone: More than three times a week    Gets together: Once a week    Attends religious service: More than 4 times per year    Active member of club or organization: No    Attends meetings of clubs or organizations: Never    Relationship status: Living with partner  . Intimate partner violence:    Fear of current or ex partner: No    Emotionally abused: No    Physically abused: No    Forced sexual activity: No  Other Topics Concern  . Not on file  Social History Narrative   She moved to [place] from [place], [place] in the Summer of 2018   She lives with her girlfriend - homosexual - together for the past 3 years   She does not have any children, but girlfriend has an adult daughter that lives with them.      Current Outpatient Medications:  .  albuterol (PROVENTIL HFA;VENTOLIN HFA) 108 (90 Base) MCG/ACT inhaler, Inhale 1-2 puffs into the lungs every 6 (six) hours as needed for wheezing or shortness of breath., Disp: , Rfl:  .  amLODipine (NORVASC) 5 MG tablet, Take 1 tablet (5 mg total) by mouth daily., Disp: 30 tablet, Rfl: 2 .  baclofen (LIORESAL) 20 MG tablet, Take 1 tablet (20 mg total) by mouth 3 (three) times daily. Prn migraine, Disp: 30 each, Rfl: 0 .  cholecalciferol (VITAMIN D)  1000 units tablet, Take 2,000 Units by mouth daily., Disp: , Rfl:  .  dicyclomine (BENTYL) 20 MG tablet, Take 1 tablet (20 mg total) by mouth 4 (four) times daily -  before meals and at bedtime. (Patient taking differently: Take 20 mg by mouth as needed. ), Disp: 40 tablet, Rfl: 0 .  ferrous sulfate 325 (65 FE) MG tablet, Take 1 tablet (325 mg total) by mouth daily with breakfast., Disp: 90 tablet, Rfl: 3 .  fluticasone (FLONASE) 50 MCG/ACT nasal spray, Place 2 sprays into both nostrils daily., Disp: 16 g, Rfl: 1 .  levothyroxine (SYNTHROID, LEVOTHROID) 100 MCG tablet, Take 1 tablet (100 mcg total) by mouth every morning., Disp: 90 tablet, Rfl: 0 .  ondansetron (ZOFRAN) 4 MG tablet, Take 1 tablet (4 mg total) by mouth every 8 (eight) hours as needed for nausea or vomiting., Disp: 8 tablet, Rfl: 0 .  pantoprazole (PROTONIX) 40 MG tablet, Take 1 tablet (40 mg total) by mouth daily., Disp: 30 tablet, Rfl: 2 .  SUMAtriptan (IMITREX) 100 MG tablet, Take 1 tablet (100 mg total) by mouth every 2 (two) hours as needed for migraine. May repeat in 2 hours if headache persists or recurs., Disp: 10 tablet, Rfl: 0 .  Vitamin D, Ergocalciferol, (DRISDOL) 50000 units CAPS capsule, Take 1 capsule (50,000 Units total) by mouth every 7 (seven) days., Disp: 12 capsule, Rfl: 0  Allergies  Allergen Reactions  . Celebrex [Celecoxib]     "shakes/tremors"  . Sesame Seed (Diagnostic) Itching  . Lisinopril Rash     ROS  Ten systems reviewed and is negative except as mentioned in HPI   Objective  Vitals:   03/10/18 0948  BP: (!) 122/94  Pulse: 83  Resp: 14  SpO2: 98%  Weight: 234 lb 12.8 oz (106.5 kg)  Height: _0  (1.626 m)    Body mass index is 40.3 kg/m.  Physical Exam  Constitutional: Patient appears well-developed and well-nourished. Obese  No distress.  HEENT: head atraumatic, normocephalic, pupils equal and reactive to light, eneck supple, throat within normal limits Cardiovascular: Normal  rate, regular rhythm and normal heart sounds.  No murmur heard. No BLE edema. Pulmonary/Chest: Effort normal and breath sounds normal. No respiratory distress. Abdominal: Soft.  There is no tenderness. Psychiatric: Patient has a normal mood and affect. behavior is normal. Judgment and thought content normal.   Recent Results (from the past 2160 hour(s))  POCT Urinalysis Dipstick     Status: Abnormal   Collection Time: 01/25/18  4:29 PM  Result Value Ref Range   Color, UA yellow  Clarity, UA clear    Glucose, UA normal    Bilirubin, UA neg    Ketones, UA neg    Spec Grav, UA <=1.005 (A) 1.010 - 1.025   Blood, UA neg    pH, UA 6.0 5.0 - 8.0   Protein, UA neg    Urobilinogen, UA 0.2 0.2 or 1.0 E.U./dL   Nitrite, UA neg    Leukocytes, UA Negative Negative   Appearance normal    Odor normal   Hemoglobin A1c     Status: Abnormal   Collection Time: 01/26/18  8:24 AM  Result Value Ref Range   Hgb A1c MFr Bld 6.0 (H) <5.7 % of total Hgb    Comment: For someone without known diabetes, a hemoglobin  A1c value between 5.7% and 6.4% is consistent with prediabetes and should be confirmed with a  follow-up test. . For someone with known diabetes, a value <7% indicates that their diabetes is well controlled. A1c targets should be individualized based on duration of diabetes, age, comorbid conditions, and other considerations. . This assay result is consistent with an increased risk of diabetes. . Currently, no consensus exists regarding use of hemoglobin A1c for diagnosis of diabetes for children. .    Mean Plasma Glucose 126 (calc)   eAG (mmol/L) 7.0 (calc)  Lipid panel     Status: Abnormal   Collection Time: 01/26/18  8:24 AM  Result Value Ref Range   Cholesterol 160 <200 mg/dL   HDL 48 (L) >50 mg/dL   Triglycerides 68 <150 mg/dL   LDL Cholesterol (Calc) 97 mg/dL (calc)    Comment: Reference range: <100 . Desirable range <100 mg/dL for primary prevention;   <70 mg/dL for  patients with CHD or diabetic patients  with > or = 2 CHD risk factors. Marland Kitchen LDL-C is now calculated using the Martin-Hopkins  calculation, which is a validated novel method providing  better accuracy than the Friedewald equation in the  estimation of LDL-C.  Cresenciano Genre et al. Annamaria Helling. 8938;101(75): 2061-2068  (http://education.[facility].com/faq/FAQ164)    Total CHOL/HDL Ratio 3.3 <5.0 (calc)   Non-HDL Cholesterol (Calc) 112 <130 mg/dL (calc)    Comment: For patients with diabetes plus 1 major ASCVD risk  factor, treating to a non-HDL-C goal of <100 mg/dL  (LDL-C of <70 mg/dL) is considered a therapeutic  option.   CBC with Differential/Platelet     Status: Abnormal   Collection Time: 01/26/18  8:24 AM  Result Value Ref Range   WBC 7.4 3.8 - 10.8 Thousand/uL   RBC 4.88 3.80 - 5.10 Million/uL   Hemoglobin 10.0 (L) 11.7 - 15.5 g/dL   HCT 32.7 (L) 35.0 - 45.0 %   MCV 67.0 (L) 80.0 - 100.0 fL   MCH 20.5 (L) 27.0 - 33.0 pg   MCHC 30.6 (L) 32.0 - 36.0 g/dL   RDW 18.2 (H) 11.0 - 15.0 %   Platelets 429 (H) 140 - 400 Thousand/uL   MPV 10.2 7.5 - 12.5 fL   Neutro Abs 4,248 1,500 - 7,800 cells/uL   Lymphs Abs 2,612 850 - 3,900 cells/uL   WBC mixed population 466 200 - 950 cells/uL   Eosinophils Absolute 67 15 - 500 cells/uL   Basophils Absolute 7 0 - 200 cells/uL   Neutrophils Relative % 57.4 %   Total Lymphocyte 35.3 %   Monocytes Relative 6.3 %   Eosinophils Relative 0.9 %   Basophils Relative 0.1 %  Comprehensive metabolic panel     Status: Abnormal  Collection Time: 01/26/18  8:24 AM  Result Value Ref Range   Glucose, Bld 101 (H) 65 - 99 mg/dL    Comment: .            Fasting reference interval . For someone without known diabetes, a glucose value between 100 and 125 mg/dL is consistent with prediabetes and should be confirmed with a follow-up test. .    BUN 10 7 - 25 mg/dL   Creat 0.72 0.50 - 1.10 mg/dL   BUN/Creatinine Ratio NOT APPLICABLE 6 - 22 (calc)   Sodium  135 135 - 146 mmol/L   Potassium 4.1 3.5 - 5.3 mmol/L   Chloride 108 98 - 110 mmol/L   CO2 20 20 - 32 mmol/L   Calcium 9.0 8.6 - 10.2 mg/dL   Total Protein 7.8 6.1 - 8.1 g/dL   Albumin 4.2 3.6 - 5.1 g/dL   Globulin 3.6 1.9 - 3.7 g/dL (calc)   AG Ratio 1.2 1.0 - 2.5 (calc)   Total Bilirubin 0.3 0.2 - 1.2 mg/dL   Alkaline phosphatase (APISO) 77 33 - 115 U/L   AST 23 10 - 30 U/L   ALT 16 6 - 29 U/L  TSH     Status: None   Collection Time: 01/26/18  8:24 AM  Result Value Ref Range   TSH 2.22 mIU/L    Comment:           Reference Range .           > or = 20 Years  0.40-4.50 .                Pregnancy Ranges           First trimester    0.26-2.66           Second trimester   0.55-2.73           Third trimester    0.43-2.91   Vitamin B12     Status: None   Collection Time: 01/26/18  8:24 AM  Result Value Ref Range   Vitamin B-12 449 200 - 1,100 pg/mL  VITAMIN D 25 Hydroxy (Vit-D Deficiency, Fractures)     Status: Abnormal   Collection Time: 01/26/18  8:24 AM  Result Value Ref Range   Vit D, 25-Hydroxy 10 (L) 30 - 100 ng/mL    Comment: Vitamin D Status         25-OH Vitamin D: . Deficiency:                    <20 ng/mL Insufficiency:             20 - 29 ng/mL Optimal:                 > or = 30 ng/mL . For 25-OH Vitamin D testing on patients on  D2-supplementation and patients for whom quantitation  of D2 and D3 fractions is required, the QuestAssureD(TM) 25-OH VIT D, (D2,D3), LC/MS/MS is recommended: order  code [id number] (patients >26yr). . For more information on this test, go to: http://education.[facility].com/faq/FAQ163 (This link is being provided for  informational/educational purposes only.)   HIV antibody     Status: None   Collection Time: 01/26/18  8:24 AM  Result Value Ref Range   HIV 1&2 Ab, 4th Generation NON-REACTIVE NON-REACTI    Comment: HIV-1 antigen and HIV-1/HIV-2 antibodies were not detected. There is no laboratory evidence of  HIV infection. .Marland KitchenPLEASE NOTE: This information has been disclosed  to you from records whose confidentiality may be protected by state law.  If your state requires such protection, then the state law prohibits you from making any further disclosure of the information without the specific written consent of the person to whom it pertains, or as otherwise permitted by law. A general authorization for the release of medical or other information is NOT sufficient for this purpose. . For additional information please refer to http://education.[facility].com/faq/FAQ106 (This link is being provided for informational/ educational purposes only.) . Marland Kitchen The performance of this assay has not been clinically validated in patients less than 29 years old. .   RPR     Status: None   Collection Time: 01/26/18  8:24 AM  Result Value Ref Range   RPR Ser Ql NON-REACTIVE NON-REACTI  Hepatitis, Acute     Status: None   Collection Time: 01/26/18  8:24 AM  Result Value Ref Range   Hep A IgM NON-REACTIVE NON-REACTI   Hepatitis B Surface Ag NON-REACTIVE NON-REACTI   Hep B C IgM NON-REACTIVE NON-REACTI   Hepatitis C Ab NON-REACTIVE NON-REACTI   SIGNAL TO CUT-OFF 0.01 <1.00  CBC MORPHOLOGY     Status: None   Collection Time: 01/26/18  8:24 AM  Result Value Ref Range   CBC MORPHOLOGY  NORMAL    Comment: Crenated red blood cells 2 + Anisocytosis 1 + Microcytosis 1 + Macrocytosis 1 + Poikilocytosis 1 + Hypochromasia 2 +   Urine Culture     Status: None   Collection Time: 01/26/18  8:24 AM  Result Value Ref Range   MICRO NUMBER: [id number]    SPECIMEN QUALITY: ADEQUATE    Sample Source URINE, CLEAN CATCH    STATUS: FINAL    Result:      Multiple organisms present, each less than 10,000 CFU/mL. These organisms, commonly found on external and internal genitalia, are considered to be colonizers. No further testing performed.  C. trachomatis/N. gonorrhoeae RNA     Status: None   Collection Time:  01/26/18  8:24 AM  Result Value Ref Range   C. trachomatis RNA, TMA NOT DETECTED NOT DETECT   N. gonorrhoeae RNA, TMA NOT DETECTED NOT DETECT    Comment: This test was performed using the [place] ([place].). . The analytical performance characteristics of this  assay, when used to test SurePath specimens have been determined by [employer]. .   Iron, TIBC and Ferritin Panel     Status: Abnormal   Collection Time: 01/26/18  8:24 AM  Result Value Ref Range   Iron 28 (L) 40 - 190 mcg/dL   TIBC 456 (H) 250 - 450 mcg/dL (calc)   %SAT 6 (L) 11 - 50 % (calc)   Ferritin 8 (L) 10 - 154 ng/mL  TEST AUTHORIZATION     Status: None   Collection Time: 01/26/18  8:24 AM  Result Value Ref Range   TEST NAME: IRON, TIBC AND FERRITIN PANEL    TEST CODE: [id number]    CLIENT CONTACT: Kiala Faraj    REPORT ALWAYS MESSAGE SIGNATURE      Comment: . The laboratory testing on this patient was verbally requested or confirmed by the ordering physician or his or her authorized representative after contact with an employee of [employer]. Federal regulations require that we maintain on file written authorization for all laboratory testing.  Accordingly we are asking that the ordering physician or his or her authorized representative sign a copy of this report and promptly return  it to the client service representative. . . Signature:____________________________________________________ . Please fax this signed page to [phone] or return it via your [employer] courier.   Urinalysis, Complete w Microscopic     Status: Abnormal   Collection Time: 02/14/18  8:32 AM  Result Value Ref Range   Color, Urine YELLOW (A) YELLOW   APPearance HAZY (A) CLEAR   Specific Gravity, Urine 1.014 1.005 - 1.030   pH 6.0 5.0 - 8.0   Glucose, UA NEGATIVE NEGATIVE mg/dL   Hgb urine dipstick NEGATIVE NEGATIVE   Bilirubin Urine NEGATIVE NEGATIVE   Ketones, ur NEGATIVE NEGATIVE  mg/dL   Protein, ur NEGATIVE NEGATIVE mg/dL   Nitrite NEGATIVE NEGATIVE   Leukocytes, UA TRACE (A) NEGATIVE   RBC / HPF 0-5 0 - 5 RBC/hpf   WBC, UA 0-5 0 - 5 WBC/hpf   Bacteria, UA NONE SEEN NONE SEEN   Squamous Epithelial / LPF 6-30 (A) NONE SEEN    Comment: Performed at [facility], [address] [place], [place] [address]  Pregnancy, urine     Status: None   Collection Time: 02/14/18  8:32 AM  Result Value Ref Range   Preg Test, Ur NEGATIVE NEGATIVE    Comment: Performed at [facility], [address] [place], [place] [address]  Lipase, blood     Status: None   Collection Time: 02/14/18  9:29 AM  Result Value Ref Range   Lipase 28 11 - 51 U/L    Comment: Performed at [facility], [address] [place], [place] [address]  Comprehensive metabolic panel     Status: Abnormal   Collection Time: 02/14/18  9:29 AM  Result Value Ref Range   Sodium 136 135 - 145 mmol/L   Potassium 3.9 3.5 - 5.1 mmol/L   Chloride 105 101 - 111 mmol/L   CO2 21 (L) 22 - 32 mmol/L   Glucose, Bld 108 (H) 65 - 99 mg/dL   BUN 11 6 - 20 mg/dL   Creatinine, Ser 0.65 0.44 - 1.00 mg/dL   Calcium 9.0 8.9 - 10.3 mg/dL   Total Protein 8.2 (H) 6.5 - 8.1 g/dL   Albumin 3.9 3.5 - 5.0 g/dL   AST 32 15 - 41 U/L   ALT 25 14 - 54 U/L   Alkaline Phosphatase 74 38 - 126 U/L   Total Bilirubin 0.4 0.3 - 1.2 mg/dL   GFR calc non Af Amer >60 >60 mL/min   GFR calc Af Amer >60 >60 mL/min    Comment: (NOTE) The eGFR has been calculated using the CKD EPI equation. This calculation has not been validated in all clinical situations. eGFR's persistently <60 mL/min signify possible Chronic Kidney Disease.    Anion gap 10 5 - 15    Comment: Performed at [facility], [address] [place], [place] [address]  CBC     Status: Abnormal   Collection Time: 02/14/18  9:29 AM  Result Value Ref Range   WBC 6.6 3.6 - 11.0 K/uL   RBC 5.01 3.80 - 5.20 MIL/uL   Hemoglobin 10.4 (L) 12.0  - 16.0 g/dL   HCT 33.2 (L) 35.0 - 47.0 %   MCV 66.2 (L) 80.0 - 100.0 fL   MCH 20.8 (L) 26.0 - 34.0 pg   MCHC 31.3 (L) 32.0 - 36.0 g/dL   RDW 19.1 (H) 11.5 - 14.5 %   Platelets 421 150 - 440 K/uL    Comment: Performed at [facility],  [place], [place] [address]  Folate     Status: None   Collection Time: 02/18/18 12:04 PM  Result Value Ref Range   Folate 11.3 >5.9 ng/mL    Comment: Performed at [facility], [address] [place], [place] [address]  Vitamin B12     Status: None   Collection Time: 02/18/18 12:04 PM  Result Value Ref Range   Vitamin B-12 368 180 - 914 pg/mL    Comment: (NOTE) This assay is not validated for testing neonatal or myeloproliferative syndrome specimens for Vitamin B12 levels. Performed at [facility], [place] [address], [place], [place] [address]   Ferritin     Status: Abnormal   Collection Time: 02/18/18 12:04 PM  Result Value Ref Range   Ferritin 4 (L) 11 - 307 ng/mL    Comment: Performed at [facility], [address] [place], [place] [address]  Iron and TIBC     Status: Abnormal   Collection Time: 02/18/18 12:04 PM  Result Value Ref Range   Iron 36 28 - 170 ug/dL   TIBC 489 (H) 250 - 450 ug/dL   Saturation Ratios 7 (L) 10.4 - 31.8 %   UIBC 453 ug/dL    Comment: Performed at [facility], [address] [place], [place] [address]  CBC with Differential/Platelet     Status: Abnormal   Collection Time: 02/18/18 12:04 PM  Result Value Ref Range   WBC 6.5 3.6 - 11.0 K/uL   RBC 4.92 3.80 - 5.20 MIL/uL   Hemoglobin 10.1 (L) 12.0 - 16.0 g/dL   HCT 32.2 (L) 35.0 - 47.0 %   MCV 65.5 (L) 80.0 - 100.0 fL   MCH 20.6 (L) 26.0 - 34.0 pg   MCHC 31.5 (L) 32.0 - 36.0 g/dL   RDW 18.7 (H) 11.5 - 14.5 %   Platelets 366 150 - 440 K/uL   Neutrophils Relative % 60 %   Neutro Abs 3.9 1.4 - 6.5 K/uL   Lymphocytes Relative 31 %   Lymphs Abs 2.1 1.0 - 3.6 K/uL   Monocytes Relative 7 %   Monocytes  Absolute 0.5 0.2 - 0.9 K/uL   Eosinophils Relative 1 %   Eosinophils Absolute 0.1 0 - 0.7 K/uL   Basophils Relative 1 %   Basophils Absolute 0.0 0 - 0.1 K/uL    Comment: Performed at [facility], [address] [place], [place] [address]     PHQ2/9: Depression screen [facility] 2/9 01/25/2018  Decreased Interest 0  Down, Depressed, Hopeless 0  PHQ - 2 Score 0     Fall Risk: Fall Risk  03/10/2018 03/10/2018 02/16/2018 01/25/2018  Falls in the past year? No No No No     Functional Status Survey: Is the patient deaf or have difficulty hearing?: No Does the patient have difficulty seeing, even when wearing glasses/contacts?: No Does the patient have difficulty concentrating, remembering, or making decisions?: No Does the patient have difficulty walking or climbing stairs?: No Does the patient have difficulty dressing or bathing?: No Does the patient have difficulty doing errands alone such as visiting a doctor's office or shopping?: No    Assessment & Plan  1. Iron deficiency anemia due to chronic blood loss  Getting iron infusion   2. Essential hypertension  Taking medication as prescribed. She smoked right before she came in   3. Migraine without aura and without status migrainosus, not intractable  Doing well now

## 2018-03-11 ENCOUNTER — Inpatient Hospital Stay: Payer: BLUE CROSS/BLUE SHIELD

## 2018-03-11 VITALS — BP 110/73 | HR 82 | Temp 96.7°F | Resp 20

## 2018-03-11 DIAGNOSIS — D500 Iron deficiency anemia secondary to blood loss (chronic): Secondary | ICD-10-CM | POA: Diagnosis not present

## 2018-03-11 MED ORDER — SODIUM CHLORIDE 0.9 % IV SOLN
Freq: Once | INTRAVENOUS | Status: AC
  Administered 2018-03-11: 14:00:00 via INTRAVENOUS
  Filled 2018-03-11: qty 1000

## 2018-03-11 MED ORDER — DIPHENHYDRAMINE HCL 25 MG PO CAPS
25.0000 mg | ORAL_CAPSULE | Freq: Once | ORAL | Status: AC
  Administered 2018-03-11: 25 mg via ORAL
  Filled 2018-03-11: qty 1

## 2018-03-11 MED ORDER — IRON SUCROSE 20 MG/ML IV SOLN
200.0000 mg | Freq: Once | INTRAVENOUS | Status: AC
  Administered 2018-03-11: 200 mg via INTRAVENOUS
  Filled 2018-03-11: qty 10

## 2018-03-14 ENCOUNTER — Inpatient Hospital Stay: Payer: BLUE CROSS/BLUE SHIELD | Attending: Oncology

## 2018-03-14 VITALS — BP 115/79 | HR 86 | Temp 97.6°F | Resp 18

## 2018-03-14 DIAGNOSIS — E039 Hypothyroidism, unspecified: Secondary | ICD-10-CM | POA: Insufficient documentation

## 2018-03-14 DIAGNOSIS — K219 Gastro-esophageal reflux disease without esophagitis: Secondary | ICD-10-CM | POA: Insufficient documentation

## 2018-03-14 DIAGNOSIS — Z79899 Other long term (current) drug therapy: Secondary | ICD-10-CM | POA: Insufficient documentation

## 2018-03-14 DIAGNOSIS — D500 Iron deficiency anemia secondary to blood loss (chronic): Secondary | ICD-10-CM | POA: Diagnosis not present

## 2018-03-14 DIAGNOSIS — R51 Headache: Secondary | ICD-10-CM | POA: Insufficient documentation

## 2018-03-14 DIAGNOSIS — F17210 Nicotine dependence, cigarettes, uncomplicated: Secondary | ICD-10-CM | POA: Insufficient documentation

## 2018-03-14 DIAGNOSIS — K2950 Unspecified chronic gastritis without bleeding: Secondary | ICD-10-CM | POA: Insufficient documentation

## 2018-03-14 DIAGNOSIS — R5383 Other fatigue: Secondary | ICD-10-CM | POA: Insufficient documentation

## 2018-03-14 MED ORDER — IRON SUCROSE 20 MG/ML IV SOLN
200.0000 mg | Freq: Once | INTRAVENOUS | Status: AC
  Administered 2018-03-14: 200 mg via INTRAVENOUS
  Filled 2018-03-14: qty 10

## 2018-03-14 MED ORDER — SODIUM CHLORIDE 0.9 % IV SOLN
Freq: Once | INTRAVENOUS | Status: AC
  Administered 2018-03-14: 14:00:00 via INTRAVENOUS
  Filled 2018-03-14: qty 1000

## 2018-03-14 MED ORDER — DIPHENHYDRAMINE HCL 25 MG PO CAPS
25.0000 mg | ORAL_CAPSULE | Freq: Once | ORAL | Status: AC
  Administered 2018-03-14: 25 mg via ORAL
  Filled 2018-03-14: qty 1

## 2018-03-25 ENCOUNTER — Encounter: Payer: Self-pay | Admitting: Gastroenterology

## 2018-03-25 ENCOUNTER — Ambulatory Visit (INDEPENDENT_AMBULATORY_CARE_PROVIDER_SITE_OTHER): Payer: BLUE CROSS/BLUE SHIELD | Admitting: Gastroenterology

## 2018-03-25 VITALS — BP 120/86 | HR 80 | Ht 64.0 in | Wt 233.8 lb

## 2018-03-25 DIAGNOSIS — D500 Iron deficiency anemia secondary to blood loss (chronic): Secondary | ICD-10-CM

## 2018-03-25 DIAGNOSIS — Z791 Long term (current) use of non-steroidal anti-inflammatories (NSAID): Secondary | ICD-10-CM | POA: Diagnosis not present

## 2018-03-25 NOTE — Progress Notes (Signed)
Cephas Darby, MD [address]  [place]  [place], [place] [address]  Main: [phone]  Fax: [phone]    Gastroenterology Consultation  Referring Provider:     Steele Sizer, MD Primary Care Physician:  Steele Sizer, MD Primary Gastroenterologist:  Dr. Cephas Darby Reason for Consultation:     Iron deficiency anemia, rectal bleeding        HPI:   Lynn Wilson is a 37 y.o. African-American female  With history of morbid obesity, hypothyroidism, and hypertension referred by Dr. Steele Sizer, MD  for consultation & management of severe iron deficiency anemia, Chronic rectal bleeding. She was in the emergency room last week secondary to left-sided abdominal pain, nausea, vomiting, diarrhea, mild rectal bleeding and underwent CT which was unremarkable other than sigmoid diverticulosis. Apparently, patient has chronic history of intermittent episodes of rectal bleeding, loose stool with dark red blood clots, left lower quadrant pain, every other month. She underwent upper endoscopy and colonoscopy in [place] in 2015 and was found to have sigmoid diverticulosis, internal hemorrhoids, hiatal hernia. She has developed iron deficiency anemia since 04/2017. Her lowest hemoglobin was 9.9, most recently 10.4, MCV 66.2, ferritin 8 in 01/2018. She saw Dr. Ancil Boozer last week who started her on oral iron supplementation, she is currently taking 1 pill a day. And, she is also taking Protonix once a day. Patient has been taking Excedrin for chronic migraine headaches for last 3-4 years, about 3-4 pills daily. She just recently started on Topamax for migraine headaches by Dr. Ancil Boozer. she had history of elevated transaminases AST greater than AST in 04/2017 which are now back to normal. This was probably in the setting of alcohol use or symptomatic cholelithiasis. She reports feeling tired, fatigued secondary to anemia. She denies active rectal bleeding, abdominal pain, nausea, vomiting,  diarrhea  Follow-up visit 03/25/2018 She saw Dr.Yu at [facility] for parenteral iron therapy due to severe iron deficiency. She received 4 infusions so far. She stopped taking Excedrin.Patient reports that her energy levels are significantly better and not experiencing headaches. She is currently constipated. She is taking oral iron 325 mg daily. She liked fusion plus iron supplements and did not cause constipation and requesting more samples. She also lost about 5 pounds in last 4 weeks cutting back on high carb diet. She is not eating during middle of the night and nothing after 7 PM. She denies any other GI symptoms. She denies loss of appetite, fever, chills, nausea, vomiting. She is taking Protonix 2 times daily  NSAIDs: Excedrin, 3-4 pills daily for chronic headaches for last 3-4 years  Antiplts/Anticoagulants/Anti thrombotics: none  GI Procedures:  Repeat EGD and colonoscopy in [place], in 2015 was told that she has hiatal hernia, diverticulosis, internal hemorrhoids Report not available Pathology from gastric biopsies was negative for H. Pylori gastritis. She denies family history of GI malignancy She denies abdominal surgeries She smokes cigarettes, drinks about 4 beers per week She denies IV drug abuse  Past Medical History:  Diagnosis Date  . GERD (gastroesophageal reflux disease)   . Hypertension   . Hypothyroid   . Hypothyroid 01/25/2018    History reviewed. No pertinent surgical history.   Current Outpatient Medications:  .  albuterol (PROVENTIL HFA;VENTOLIN HFA) 108 (90 Base) MCG/ACT inhaler, Inhale 1-2 puffs into the lungs every 6 (six) hours as needed for wheezing or shortness of breath., Disp: , Rfl:  .  amLODipine (NORVASC) 5 MG tablet, Take 1 tablet (5 mg total) by  mouth daily., Disp: 30 tablet, Rfl: 2 .  baclofen (LIORESAL) 20 MG tablet, Take 1 tablet (20 mg total) by mouth 3 (three) times daily. Prn migraine, Disp: 30 each, Rfl: 0 .  cholecalciferol (VITAMIN  D) 1000 units tablet, Take 2,000 Units by mouth daily., Disp: , Rfl:  .  dicyclomine (BENTYL) 20 MG tablet, Take 1 tablet (20 mg total) by mouth 4 (four) times daily -  before meals and at bedtime. (Patient taking differently: Take 20 mg by mouth as needed. ), Disp: 40 tablet, Rfl: 0 .  ferrous sulfate 325 (65 FE) MG tablet, Take 1 tablet (325 mg total) by mouth daily with breakfast., Disp: 90 tablet, Rfl: 3 .  fluticasone (FLONASE) 50 MCG/ACT nasal spray, Place 2 sprays into both nostrils daily., Disp: 16 g, Rfl: 1 .  levothyroxine (SYNTHROID, LEVOTHROID) 100 MCG tablet, Take 1 tablet (100 mcg total) by mouth every morning., Disp: 90 tablet, Rfl: 0 .  ondansetron (ZOFRAN) 4 MG tablet, Take 1 tablet (4 mg total) by mouth every 8 (eight) hours as needed for nausea or vomiting., Disp: 8 tablet, Rfl: 0 .  pantoprazole (PROTONIX) 40 MG tablet, Take 1 tablet (40 mg total) by mouth daily., Disp: 30 tablet, Rfl: 2 .  SUMAtriptan (IMITREX) 100 MG tablet, Take 1 tablet (100 mg total) by mouth every 2 (two) hours as needed for migraine. May repeat in 2 hours if headache persists or recurs., Disp: 10 tablet, Rfl: 0 .  Vitamin D, Ergocalciferol, (DRISDOL) 50000 units CAPS capsule, Take 1 capsule (50,000 Units total) by mouth every 7 (seven) days., Disp: 12 capsule, Rfl: 0   Family History  Problem Relation Age of Onset  . Hypertension Mother   . Hypertension Father   . Stroke Father      Social History   Tobacco Use  . Smoking status: Current Every Day Smoker    Packs/day: 0.50    Years: 18.00    Pack years: 9.00    Types: Cigarettes    Start date: 01/26/2000  . Smokeless tobacco: Never Used  . Tobacco comment: contemplating   Substance Use Topics  . Alcohol use: Yes    Alcohol/week: 2.4 oz    Types: 4 Cans of beer per week  . Drug use: No    Allergies as of 03/25/2018 - Review Complete 03/25/2018  Allergen Reaction Noted  . Celebrex [celecoxib]  03/08/2012  . Sesame seed (diagnostic)  Itching 01/25/2018  . Lisinopril Rash 03/08/2012    Review of Systems:    All systems reviewed and negative except where noted in HPI.   Physical Exam:  BP 120/86   Pulse 80   Ht 5\' 4"  (1.626 m)   Wt 233 lb 12.8 oz (106.1 kg)   BMI 40.13 kg/m  No LMP recorded.  General:   Alert,  Well-developed, well-nourished, pleasant and cooperative in NAD Head:  Normocephalic and atraumatic. Eyes:  Sclera clear, no icterus.   Conjunctiva pink. Ears:  Normal auditory acuity. Nose:  No deformity, discharge, or lesions. Mouth:  No deformity or lesions,oropharynx pink & moist. Neck:  Supple; no masses or thyromegaly. Lungs:  Respirations even and unlabored.  Clear throughout to auscultation.   No wheezes, crackles, or rhonchi. No acute distress. Heart:  Regular rate and rhythm; no murmurs, clicks, rubs, or gallops. Abdomen:  Normal bowel sounds. Soft, obesity,non-tender and non-distended,  No guarding or rebound tenderness.   Rectal: Not performed Msk:  Symmetrical without gross deformities. Good, equal movement & strength bilaterally.  Pulses:  Normal pulses noted. Extremities:  No clubbing or edema.  No cyanosis. Neurologic:  Alert and oriented x3;  grossly normal neurologically. Skin:  Intact without significant lesions or rashes. No jaundice. Psych:  Alert and cooperative. Normal mood and affect.  Imaging Studies: Reviewed   Assessment and Plan:   EMERSYNN DEATLEY is a 37 y.o. African-American female with metabolic syndrome presents with chronic history of intermittent episodes of left lower quadrant pain, rectal bleeding since 2015, heavy NSAID use for chronic headaches, found to have severe iron deficiency anemia. She had EGD and colonoscopy in 2015, there was no evidence of peptic ulcer disease. She had a hiatal hernia, sigmoid diverticulosis and internal hemorrhoids. The etiology of iron deficiency anemia is multifactorial including NSAID-induced enteropathy, NSAID-induced gastritis,  recurrent diverticular bleed in the setting of heavy Excedrin use, bleeding internal hemorrhoids. Overall, patient is clinically better  My recommendations are - continue to avoid NSAIDs - Continue oral iron replacement, samples on fusion plus provided and follow-up with hematology - continue Protonix to 40 mg twice daily - Recheck CBC, ferritin, iron panel next week - if patient continues to have persistent iron deficiency anemia, I'll perform EGD and colonoscopy +/- VCE   Follow up in 2 months   Cephas Darby, MD

## 2018-03-27 ENCOUNTER — Emergency Department
Admission: EM | Admit: 2018-03-27 | Discharge: 2018-03-27 | Disposition: A | Discharge status: Home / Self Care | Payer: BLUE CROSS/BLUE SHIELD | Attending: Emergency Medicine | Admitting: Emergency Medicine

## 2018-03-27 ENCOUNTER — Encounter: Payer: Self-pay | Admitting: Emergency Medicine

## 2018-03-27 DIAGNOSIS — H6501 Acute serous otitis media, right ear: Secondary | ICD-10-CM | POA: Diagnosis not present

## 2018-03-27 DIAGNOSIS — F17210 Nicotine dependence, cigarettes, uncomplicated: Secondary | ICD-10-CM | POA: Diagnosis not present

## 2018-03-27 DIAGNOSIS — E039 Hypothyroidism, unspecified: Secondary | ICD-10-CM | POA: Diagnosis not present

## 2018-03-27 DIAGNOSIS — H9201 Otalgia, right ear: Secondary | ICD-10-CM

## 2018-03-27 DIAGNOSIS — Z79899 Other long term (current) drug therapy: Secondary | ICD-10-CM | POA: Diagnosis not present

## 2018-03-27 DIAGNOSIS — I10 Essential (primary) hypertension: Secondary | ICD-10-CM | POA: Insufficient documentation

## 2018-03-27 MED ORDER — CETIRIZINE HCL 10 MG PO CAPS: 10.0000 mg | ORAL_CAPSULE | Freq: Every day | ORAL | 0 refills | Status: DC

## 2018-03-27 MED ORDER — ACETAMINOPHEN-CODEINE #3 300-30 MG PO TABS: 1.0000 | ORAL_TABLET | ORAL | 0 refills | Status: DC | PRN

## 2018-03-27 MED ORDER — AMOXICILLIN 500 MG PO TABS: 500.0000 mg | ORAL_TABLET | Freq: Three times a day (TID) | ORAL | 0 refills | Status: DC

## 2018-03-27 NOTE — ED Notes (Signed)

## 2018-03-27 NOTE — ED Provider Notes (Signed)
[facility] Emergency Department Provider Note ____________________________________________  Time seen: Approximately 6:01 PM  I have reviewed the triage vital signs and the nursing notes.   HISTORY  Chief Complaint Otalgia    HPI Lynn Wilson is a 37 y.o. female who presents to the emergency department for evaluation treatment of right ear pain that started in the middle of the night last night.  Patient states that she has taken some ibuprofen without any relief.  She states that the pain gets worse if she does not have cotton inside the ear.  She denies recent illness.   Past Medical History:  Diagnosis Date  . GERD (gastroesophageal reflux disease)   . Hypertension   . Hypothyroid   . Hypothyroid 01/25/2018    Patient Active Problem List   Diagnosis Date Noted  . Intractable migraine with aura with status migrainosus 02/16/2018  . Iron deficiency anemia due to chronic blood loss 02/16/2018  . Hyperglycemia 01/29/2018  . Metabolic syndrome 75/64/3329  . Hypertension 01/25/2018  . GERD (gastroesophageal reflux disease) 01/25/2018  . Hypothyroid 01/25/2018  . Morbid obesity ([place]) 01/25/2018    History reviewed. No pertinent surgical history.  Prior to Admission medications   Medication Sig Start Date End Date Taking? Authorizing Provider  acetaminophen-codeine (TYLENOL #3) 300-30 MG tablet Take 1 tablet by mouth every 4 (four) hours as needed for moderate pain. 03/27/18   Hailie Searight, Johnette Abraham B, FNP  albuterol (PROVENTIL HFA;VENTOLIN HFA) 108 (90 Base) MCG/ACT inhaler Inhale 1-2 puffs into the lungs every 6 (six) hours as needed for wheezing or shortness of breath.    [provider]  amLODipine (NORVASC) 5 MG tablet Take 1 tablet (5 mg total) by mouth daily. 01/25/18   Steele Sizer, MD  amoxicillin (AMOXIL) 500 MG tablet Take 1 tablet (500 mg total) by mouth 3 (three) times daily. 03/27/18   Ojani Berenson, Johnette Abraham B, FNP  baclofen (LIORESAL) 20 MG  tablet Take 1 tablet (20 mg total) by mouth 3 (three) times daily. Prn migraine 02/16/18   Steele Sizer, MD  Cetirizine HCl 10 MG CAPS Take 1 capsule (10 mg total) by mouth daily. 03/27/18   Roseland Braun, Johnette Abraham B, FNP  cholecalciferol (VITAMIN D) 1000 units tablet Take 2,000 Units by mouth daily.    [provider]  dicyclomine (BENTYL) 20 MG tablet Take 1 tablet (20 mg total) by mouth 4 (four) times daily -  before meals and at bedtime. Patient taking differently: Take 20 mg by mouth as needed.  08/30/15   Rolland Porter, MD  ferrous sulfate 325 (65 FE) MG tablet Take 1 tablet (325 mg total) by mouth daily with breakfast. 02/16/18   Ancil Boozer, Drue Stager, MD  fluticasone (FLONASE) 50 MCG/ACT nasal spray Place 2 sprays into both nostrils daily. 02/16/18   Steele Sizer, MD  levothyroxine (SYNTHROID, LEVOTHROID) 100 MCG tablet Take 1 tablet (100 mcg total) by mouth every morning. 02/25/18   Steele Sizer, MD  ondansetron (ZOFRAN) 4 MG tablet Take 1 tablet (4 mg total) by mouth every 8 (eight) hours as needed for nausea or vomiting. 02/14/18   Schuyler Amor, MD  pantoprazole (PROTONIX) 40 MG tablet Take 1 tablet (40 mg total) by mouth daily. 01/25/18   Steele Sizer, MD  SUMAtriptan (IMITREX) 100 MG tablet Take 1 tablet (100 mg total) by mouth every 2 (two) hours as needed for migraine. May repeat in 2 hours if headache persists or recurs. 02/16/18   Steele Sizer, MD  Vitamin D, Ergocalciferol, (DRISDOL) 50000  units CAPS capsule Take 1 capsule (50,000 Units total) by mouth every 7 (seven) days. 02/16/18   Steele Sizer, MD    Allergies Celebrex [celecoxib]; Sesame seed (diagnostic); and Lisinopril  Family History  Problem Relation Age of Onset  . Hypertension Mother   . Hypertension Father   . Stroke Father     Social History Social History   Tobacco Use  . Smoking status: Current Every Day Smoker    Packs/day: 0.50    Years: 18.00    Pack years: 9.00    Types: Cigarettes    Start date:  01/26/2000  . Smokeless tobacco: Never Used  . Tobacco comment: contemplating   Substance Use Topics  . Alcohol use: Yes    Alcohol/week: 2.4 oz    Types: 4 Cans of beer per week  . Drug use: No    Review of Systems Constitutional: Negative for fever.  Positive for decreased ability to hear from right ear(s). Eyes: Negative for discharge or drainage. ENT:       Positive for otalgia in right ear(s).      Negative for rhinorrhea or congestion.      Negative for sore throat. Gastrointestinal: Negative for nausea, vomiting, or diarrhea. Musculoskeletal: Negative for myalgias. Skin: Negative for rash, lesions, or wounds. Neurological: Negative for paresthesias. ____________________________________________   PHYSICAL EXAM:  VITAL SIGNS: ED Triage Vitals  Enc Vitals Group     BP 03/27/18 1709 139/79     Pulse Rate 03/27/18 1709 76     Resp 03/27/18 1709 17     Temp 03/27/18 1709 98.5 F (36.9 C)     Temp Source 03/27/18 1709 Oral     SpO2 03/27/18 1709 100 %     Weight 03/27/18 1706 233 lb (105.7 kg)     Height 03/27/18 1706 5\' 4"  (1.626 m)     Head Circumference --      Peak Flow --      Pain Score 03/27/18 1706 10     Pain Loc --      Pain Edu? --      Excl. in [place]? --     Constitutional: Acutely ill appearing. Eyes: Conjunctivae are clear without discharge or drainage. Ears:       Right TM appears bulging, erythematous, dull.      Left TM appears clear fluid noted behind the TM. Head: Atraumatic. Nose: No rhinorrhea or sinus pain on percussion. Mouth/Throat: Oropharynx appears normal. Tonsils flat without exudate. Hematological/Lymphatic/Immunilogical: No palpable anterior cervical lymphadenopathy. Cardiovascular: Heart rate and rhythm are regular without murmur, gallop, or rub appreciated. Respiratory: Breath sounds are clear throughout to auscultation.  Neurologic:  Alert and oriented x 4. Skin: Intact and without rash, lesion, or wound on exposed skin  surfaces. ____________________________________________   LABS (all labs ordered are listed, but only abnormal results are displayed)  Labs Reviewed - No data to display ____________________________________________   RADIOLOGY  Not indicated ____________________________________________   PROCEDURES  Procedure(s) performed:   Procedures  ____________________________________________   INITIAL IMPRESSION / ASSESSMENT AND PLAN / ED COURSE  37 year old female presenting to the emergency department for evaluation and treatment of right ear pain.  She was placed on amoxicillin and will be given Tylenol 3 and cetirizine.  She was encouraged to follow-up with her primary care provider for symptoms that are not improving over the next couple of days.  She was encouraged to return to the emergency department for symptoms of change or worsen if he is unable  to schedule appointment.  Pertinent labs & imaging results that were available during my care of the patient were reviewed by me and considered in my medical decision making (see chart for details). ____________________________________________   FINAL CLINICAL IMPRESSION(S) / ED DIAGNOSES  Final diagnoses:  Otalgia of right ear  Non-recurrent acute serous otitis media of right ear    ED Discharge Orders        Ordered    amoxicillin (AMOXIL) 500 MG tablet  3 times daily     03/27/18 1754    acetaminophen-codeine (TYLENOL #3) 300-30 MG tablet  Every 4 hours PRN     03/27/18 1754    Cetirizine HCl 10 MG CAPS  Daily     03/27/18 1754      If controlled substance prescribed during this visit, 12 month history viewed on the [place] prior to issuing an initial prescription for Schedule II or III opiod.   Note:  This document was prepared using Dragon voice recognition software and may include unintentional dictation errors.     Victorino Dike, FNP 03/27/18 1804    Nena Polio, MD 03/27/18 2326

## 2018-03-27 NOTE — ED Notes (Signed)
Pt states right ear pain started today. Pain increases in the right ear when she opens her mouth or swallows. Nasal congestion x1 weeks.

## 2018-03-27 NOTE — ED Triage Notes (Signed)
Pt comes into the ED via POV c/o right otalgia.  Patient in NAD at this time.

## 2018-03-28 ENCOUNTER — Inpatient Hospital Stay: Payer: BLUE CROSS/BLUE SHIELD

## 2018-03-28 ENCOUNTER — Other Ambulatory Visit
Admission: RE | Admit: 2018-03-28 | Discharge: 2018-03-28 | Disposition: A | Discharge status: Home / Self Care | Payer: BLUE CROSS/BLUE SHIELD | Source: Ambulatory Visit | Attending: Oncology | Admitting: Oncology

## 2018-03-28 DIAGNOSIS — D500 Iron deficiency anemia secondary to blood loss (chronic): Secondary | ICD-10-CM | POA: Insufficient documentation

## 2018-03-28 LAB — CBC WITH DIFFERENTIAL/PLATELET
Basophils Absolute: 0.1 10*3/uL (ref 0–0.1)
Basophils Relative: 1 %
Eosinophils Absolute: 0.1 10*3/uL (ref 0–0.7)
Eosinophils Relative: 1 %
HCT: 36.8 % (ref 35.0–47.0)
Hemoglobin: 11.8 g/dL — ABNORMAL LOW (ref 12.0–16.0)
Lymphocytes Relative: 34 %
Lymphs Abs: 2.5 10*3/uL (ref 1.0–3.6)
MCH: 23.7 pg — ABNORMAL LOW (ref 26.0–34.0)
MCHC: 32.1 g/dL (ref 32.0–36.0)
MCV: 73.7 fL — ABNORMAL LOW (ref 80.0–100.0)
Monocytes Absolute: 0.6 10*3/uL (ref 0.2–0.9)
Monocytes Relative: 8 %
Neutro Abs: 4.1 10*3/uL (ref 1.4–6.5)
Neutrophils Relative %: 56 %
Platelets: 290 10*3/uL (ref 150–440)
RBC: 4.99 MIL/uL (ref 3.80–5.20)
RDW: 27.1 % — ABNORMAL HIGH (ref 11.5–14.5)
WBC: 7.4 10*3/uL (ref 3.6–11.0)

## 2018-03-28 LAB — IRON AND TIBC
Iron: 55 ug/dL (ref 28–170)
Saturation Ratios: 17 % (ref 10.4–31.8)
TIBC: 320 ug/dL (ref 250–450)
UIBC: 265 ug/dL

## 2018-03-28 LAB — FERRITIN: Ferritin: 75 ng/mL (ref 11–307)

## 2018-03-30 ENCOUNTER — Inpatient Hospital Stay: Payer: BLUE CROSS/BLUE SHIELD

## 2018-03-30 ENCOUNTER — Inpatient Hospital Stay (HOSPITAL_BASED_OUTPATIENT_CLINIC_OR_DEPARTMENT_OTHER): Payer: BLUE CROSS/BLUE SHIELD | Admitting: Oncology

## 2018-03-30 ENCOUNTER — Encounter: Payer: Self-pay | Admitting: Oncology

## 2018-03-30 ENCOUNTER — Other Ambulatory Visit: Payer: Self-pay

## 2018-03-30 VITALS — BP 122/79 | HR 80 | Temp 97.7°F | Wt 234.0 lb

## 2018-03-30 VITALS — BP 131/83 | HR 65 | Temp 97.7°F | Resp 18

## 2018-03-30 DIAGNOSIS — D500 Iron deficiency anemia secondary to blood loss (chronic): Secondary | ICD-10-CM | POA: Diagnosis not present

## 2018-03-30 DIAGNOSIS — E039 Hypothyroidism, unspecified: Secondary | ICD-10-CM | POA: Diagnosis not present

## 2018-03-30 DIAGNOSIS — F17210 Nicotine dependence, cigarettes, uncomplicated: Secondary | ICD-10-CM | POA: Diagnosis not present

## 2018-03-30 DIAGNOSIS — Z79899 Other long term (current) drug therapy: Secondary | ICD-10-CM

## 2018-03-30 DIAGNOSIS — R5383 Other fatigue: Secondary | ICD-10-CM | POA: Diagnosis not present

## 2018-03-30 DIAGNOSIS — K219 Gastro-esophageal reflux disease without esophagitis: Secondary | ICD-10-CM | POA: Diagnosis not present

## 2018-03-30 DIAGNOSIS — K2950 Unspecified chronic gastritis without bleeding: Secondary | ICD-10-CM

## 2018-03-30 DIAGNOSIS — R51 Headache: Secondary | ICD-10-CM | POA: Diagnosis not present

## 2018-03-30 MED ORDER — IRON SUCROSE 20 MG/ML IV SOLN
200.0000 mg | Freq: Once | INTRAVENOUS | Status: AC
  Administered 2018-03-30: 200 mg via INTRAVENOUS
  Filled 2018-03-30: qty 10

## 2018-03-30 MED ORDER — DIPHENHYDRAMINE HCL 25 MG PO CAPS
25.0000 mg | ORAL_CAPSULE | Freq: Once | ORAL | Status: DC
  Filled 2018-03-30: qty 1

## 2018-03-30 MED ORDER — SODIUM CHLORIDE 0.9 % IV SOLN
Freq: Once | INTRAVENOUS | Status: AC
Start: 2018-03-30 — End: 2018-03-30
  Administered 2018-03-30: 15:00:00 via INTRAVENOUS
  Filled 2018-03-30: qty 1000

## 2018-03-30 NOTE — Progress Notes (Signed)
Hematology/Oncology Follow up note [facility] [phone] [phone]   Patient Care Team: Steele Sizer, MD as PCP - General (Family Medicine) Steele Sizer, MD (Family Medicine)  REFERRING PROVIDER: Dr.Vanga Rohini CHIEF COMPLAINTS/PURPOSE OF CONSULTATION:  Evaluation of iron deficiency anemia.   HISTORY OF PRESENTING ILLNESS:  Lynn Wilson is a  37 y.o.  female with PMH listed below who was referred to me for evaluation of iron deficiency anemia.  Patient was recently seen by gastroenterologist for consultation and management of severe iron deficiency anemia, chronic rectal bleeding.  She has had CT of the abdomen done which was unremarkable other than sigmoid diverticulosis.  She also has a history of intermittent rectal bleeding, diarrhea, dark stools, chronic NSAIDs use for headache which was recently stopped after GI visit. NSAIDs: Excedrin, 3-4 pills daily for chronic headaches for last 3-4 years Antiplts/Anticoagulants/Anti thrombotics: none She denies family history of GI malignancy Patient also reports heavy menstrual bleeding.  She has tried oral iron supplementations in the past. Patient has chronic anemia, most recent labs on February 18, 2018 showed hemoglobin 10.1, MCV 65.5, normal platelets and normal white blood cell count with normal differentials. Iron panel was obtained on same day which showed elevated TIBC at 489, decreased saturation ratio and 7%, decreased ferritin level at 4. Patient reports feeling tired, lack of energy.  Craving ice chips.  Intermittent abdominal pain.  Today she also has onset of menstrual and has a lot of clots. INTERVAL HISTORY JAKALA HERFORD is a 37 y.o. female who has above history reviewed by me today presents for follow up visit for management of iron deficiency anemia.  S/p IV venofer x 4. She feels less fatigued. No SOB, abd pain. She takes protonix.   Review of Systems    Constitutional: Negative for chills, fever, malaise/fatigue and weight loss.  HENT: Negative for ear discharge, ear pain and tinnitus.   Eyes: Negative for double vision, photophobia and pain.  Respiratory: Negative for cough, hemoptysis and sputum production.   Cardiovascular: Negative for chest pain, palpitations, orthopnea and claudication.  Gastrointestinal: Negative for abdominal pain, blood in stool, diarrhea, nausea and vomiting.  Genitourinary: Negative for dysuria, frequency and urgency.  Musculoskeletal: Negative for myalgias and neck pain.  Skin: Negative for itching and rash.  Neurological: Negative for dizziness and tingling.  Endo/Heme/Allergies: Does not bruise/bleed easily.  Psychiatric/Behavioral: Negative for depression, hallucinations, memory loss and substance abuse.    MEDICAL HISTORY:  Past Medical History:  Diagnosis Date  . GERD (gastroesophageal reflux disease)   . Hypertension   . Hypothyroid   . Hypothyroid 01/25/2018    SURGICAL HISTORY: No past surgical history on file.  SOCIAL HISTORY: Social History   Socioeconomic History  . Marital status: Single    Spouse name: Curator  . Number of children: 0  . Years of education: Not on file  . Highest education level: Associate degree: occupational, Hotel manager, or vocational program  Occupational History  . Occupation: Chiropractor: [employer]  Social Needs  . Financial resource strain: Somewhat hard  . Food insecurity:    Worry: Sometimes true    Inability: Sometimes true  . Transportation needs:    Medical: No    Non-medical: No  Tobacco Use  . Smoking status: Current Every Day Smoker    Packs/day: 0.50    Years: 18.00    Pack years: 9.00    Types: Cigarettes    Start date: 01/26/2000  .  Smokeless tobacco: Never Used  . Tobacco comment: contemplating   Substance and Sexual Activity  . Alcohol use: Yes    Alcohol/week: 2.4 oz    Types: 4 Cans of beer per week  . Drug use: No   . Sexual activity: Yes    Birth control/protection: Other-see comments    Comment: homosexual   Lifestyle  . Physical activity:    Days per week: 0 days    Minutes per session: 0 min  . Stress: Very much  Relationships  . Social connections:    Talks on phone: More than three times a week    Gets together: Once a week    Attends religious service: More than 4 times per year    Active member of club or organization: No    Attends meetings of clubs or organizations: Never    Relationship status: Living with partner  . Intimate partner violence:    Fear of current or ex partner: No    Emotionally abused: No    Physically abused: No    Forced sexual activity: No  Other Topics Concern  . Not on file  Social History Narrative   She moved to [place] from [place], [place] in the Summer of 2018   She lives with her girlfriend - homosexual - together for the past 3 years   She does not have any children, but girlfriend has an adult daughter that lives with them.     FAMILY HISTORY: Family History  Problem Relation Age of Onset  . Hypertension Mother   . Hypertension Father   . Stroke Father     ALLERGIES:  is allergic to celebrex [celecoxib]; sesame seed (diagnostic); and lisinopril.  MEDICATIONS:  Current Outpatient Medications  Medication Sig Dispense Refill  . acetaminophen-codeine (TYLENOL #3) 300-30 MG tablet Take 1 tablet by mouth every 4 (four) hours as needed for moderate pain. 12 tablet 0  . albuterol (PROVENTIL HFA;VENTOLIN HFA) 108 (90 Base) MCG/ACT inhaler Inhale 1-2 puffs into the lungs every 6 (six) hours as needed for wheezing or shortness of breath.    Marland Kitchen amLODipine (NORVASC) 5 MG tablet Take 1 tablet (5 mg total) by mouth daily. 30 tablet 2  . amoxicillin (AMOXIL) 500 MG tablet Take 1 tablet (500 mg total) by mouth 3 (three) times daily. 30 tablet 0  . baclofen (LIORESAL) 20 MG tablet Take 1 tablet (20 mg total) by mouth 3 (three) times daily. Prn migraine 30 each  0  . Cetirizine HCl 10 MG CAPS Take 1 capsule (10 mg total) by mouth daily. 30 capsule 0  . cholecalciferol (VITAMIN D) 1000 units tablet Take 2,000 Units by mouth daily.    Marland Kitchen dicyclomine (BENTYL) 20 MG tablet Take 1 tablet (20 mg total) by mouth 4 (four) times daily -  before meals and at bedtime. (Patient taking differently: Take 20 mg by mouth as needed. ) 40 tablet 0  . ferrous sulfate 325 (65 FE) MG tablet Take 1 tablet (325 mg total) by mouth daily with breakfast. 90 tablet 3  . fluticasone (FLONASE) 50 MCG/ACT nasal spray Place 2 sprays into both nostrils daily. 16 g 1  . levothyroxine (SYNTHROID, LEVOTHROID) 100 MCG tablet Take 1 tablet (100 mcg total) by mouth every morning. 90 tablet 0  . ondansetron (ZOFRAN) 4 MG tablet Take 1 tablet (4 mg total) by mouth every 8 (eight) hours as needed for nausea or vomiting. 8 tablet 0  . pantoprazole (PROTONIX) 40 MG tablet Take 1 tablet (  40 mg total) by mouth daily. 30 tablet 2  . SUMAtriptan (IMITREX) 100 MG tablet Take 1 tablet (100 mg total) by mouth every 2 (two) hours as needed for migraine. May repeat in 2 hours if headache persists or recurs. 10 tablet 0  . Vitamin D, Ergocalciferol, (DRISDOL) 50000 units CAPS capsule Take 1 capsule (50,000 Units total) by mouth every 7 (seven) days. 12 capsule 0   No current facility-administered medications for this visit.      PHYSICAL EXAMINATION: ECOG PERFORMANCE STATUS: 1 - Symptomatic but completely ambulatory Vitals:   03/30/18 1411  BP: 122/79  Pulse: 80  Temp: 97.7 F (36.5 C)   Filed Weights   03/30/18 1411  Weight: 234 lb (106.1 kg)    Physical Exam  Constitutional: She is oriented to person, place, and time and well-developed, well-nourished, and in no distress. No distress.  HENT:  Head: Normocephalic and atraumatic.  Nose: Nose normal.  Mouth/Throat: Oropharynx is clear and moist. No oropharyngeal exudate.  Eyes: Pupils are equal, round, and reactive to light. EOM are normal.  Left eye exhibits no discharge. No scleral icterus.  Neck: Normal range of motion. Neck supple. No JVD present.  Cardiovascular: Normal rate, normal heart sounds and intact distal pulses.  No murmur heard. Pulmonary/Chest: Effort normal and breath sounds normal. No respiratory distress. She has no wheezes. She has no rales.  Abdominal: Soft. Bowel sounds are normal. She exhibits no distension. There is no tenderness. There is no rebound.  Musculoskeletal: She exhibits no edema or deformity.  Lymphadenopathy:    She has no cervical adenopathy.  Neurological: She is alert and oriented to person, place, and time. No cranial nerve deficit. She exhibits abnormal muscle tone. GCS score is 15.  Skin: Skin is warm and dry. No rash noted. She is not diaphoretic. No erythema.  Psychiatric: Affect and judgment normal.     LABORATORY DATA:  I have reviewed the data as listed Lab Results  Component Value Date   WBC 7.4 03/28/2018   HGB 11.8 (L) 03/28/2018   HCT 36.8 03/28/2018   MCV 73.7 (L) 03/28/2018   PLT 290 03/28/2018   Recent Labs    04/27/17 1612 07/04/17 1435 01/26/18 0824 02/14/18 0929  NA 136 137 135 136  K 3.8 3.7 4.1 3.9  CL 107 105 108 105  CO2 22 23 20  21*  GLUCOSE 92 92 101* 108*  BUN 9 13 10 11   CREATININE 0.73 0.78 0.72 0.65  CALCIUM 8.6* 9.0 9.0 9.0  GFRNONAA >60 >60  --  >60  GFRAA >60 >60  --  >60  PROT 8.1 8.3* 7.8 8.2*  ALBUMIN 3.9 4.1  --  3.9  AST 368* 42* 23 32  ALT 195* 39 16 25  ALKPHOS 182* 90  --  74  BILITOT 0.6 0.4 0.3 0.4       ASSESSMENT & PLAN:  1. Iron deficiency anemia due to chronic blood loss   2. Chronic gastritis without bleeding, unspecified gastritis type    # Lab results were discussed with patient, her hemoglobin and MCV have significantly improved, has not normalized.  Microcytic anemia, .basline MCV low. Possible thalassemia.  Will give one more vernofer treatment today,  and continue monitor. She can continue take oral ferrous  sulfate twice a day.   #Avoid NSAIDs, continue Protonix #Continue follow-up with gastroenterology.  All questions were answered. The patient knows to call the clinic with any problems questions or concerns.  Return of visit: 3  months.   Thank you for this kind referral and the opportunity to participate in the care of this patient. A copy of today's note is routed to referring provider    Earlie Server, MD, PhD Hematology Oncology [facility] Pager- [phone] 03/30/2018

## 2018-03-30 NOTE — Progress Notes (Signed)
Patient here today for follow up.  Patient states no new concerns today  

## 2018-03-30 NOTE — Progress Notes (Signed)
Pt tolerated infusion well. Pt denies any complaints at this time. Pt and VS stable at discharge.

## 2018-05-10 ENCOUNTER — Encounter: Payer: Self-pay | Admitting: Family Medicine

## 2018-05-10 ENCOUNTER — Ambulatory Visit: Payer: BLUE CROSS/BLUE SHIELD | Admitting: Family Medicine

## 2018-05-10 VITALS — BP 130/90 | HR 78 | Resp 16 | Ht 64.0 in | Wt 234.6 lb

## 2018-05-10 DIAGNOSIS — N920 Excessive and frequent menstruation with regular cycle: Secondary | ICD-10-CM

## 2018-05-10 DIAGNOSIS — D500 Iron deficiency anemia secondary to blood loss (chronic): Secondary | ICD-10-CM | POA: Diagnosis not present

## 2018-05-10 DIAGNOSIS — I10 Essential (primary) hypertension: Secondary | ICD-10-CM

## 2018-05-10 DIAGNOSIS — K219 Gastro-esophageal reflux disease without esophagitis: Secondary | ICD-10-CM | POA: Diagnosis not present

## 2018-05-10 DIAGNOSIS — Z01419 Encounter for gynecological examination (general) (routine) without abnormal findings: Secondary | ICD-10-CM | POA: Diagnosis not present

## 2018-05-10 DIAGNOSIS — K648 Other hemorrhoids: Secondary | ICD-10-CM | POA: Diagnosis not present

## 2018-05-10 DIAGNOSIS — Z124 Encounter for screening for malignant neoplasm of cervix: Secondary | ICD-10-CM

## 2018-05-10 DIAGNOSIS — F17210 Nicotine dependence, cigarettes, uncomplicated: Secondary | ICD-10-CM

## 2018-05-10 MED ORDER — MEDROXYPROGESTERONE ACETATE 150 MG/ML IM SUSP: 150.0000 mg | INTRAMUSCULAR | 3 refills | Status: DC

## 2018-05-10 MED ORDER — LEVOTHYROXINE SODIUM 100 MCG PO TABS: 100.0000 ug | ORAL_TABLET | ORAL | 0 refills | Status: DC

## 2018-05-10 MED ORDER — AMLODIPINE BESYLATE 5 MG PO TABS: 5.0000 mg | ORAL_TABLET | Freq: Every day | ORAL | 0 refills | Status: DC

## 2018-05-10 MED ORDER — HYDROCHLOROTHIAZIDE 12.5 MG PO TABS: 12.5000 mg | ORAL_TABLET | Freq: Every day | ORAL | 0 refills | Status: DC

## 2018-05-10 NOTE — Progress Notes (Signed)
Name: Lynn Wilson   MRN: [id number]    DOB: [birth year]   Date:05/10/2018       Progress Note  Subjective  Chief Complaint  Chief Complaint  Patient presents with  . Hypertension    wants to be on bp medication to lower her diastolic bp. Want it to be managed better.  . Gastroesophageal Reflux  . Hypothyroidism  . Hyperglycemia    HPI   Patient presents for annual CPE and follow up   Migraine: doing better since iron infusion, iron storage is back to normal, anemia is now mild.  Diet: discussed life style modification takes Imitrex prn   Morbid obesity: she states having difficulty cutting down on carbohydrates and sodas. Discussed importance of losing weight to improve her health  HTN: she used to take hctz 25 and norvasc 5 but she was feeling drained, now bp is elevated, we will try resuming low dose hctz and monitor. No chest pain or palpitation  Hypothyroidism: she wants to hold off on repeating labs today, no hair loss, she has chronic dry skin  Menorrhagia: regular but heavy cycles, we will try Depo  Exercise: only active with activity   USPSTF grade A and B recommendations  Depression:  Depression screen [facility] 2/9 05/10/2018 01/25/2018  Decreased Interest 0 0  Down, Depressed, Hopeless 0 0  PHQ - 2 Score 0 0  Altered sleeping 1 -  Tired, decreased energy 0 -  Change in appetite 0 -  Feeling bad or failure about yourself  0 -  Trouble concentrating 0 -  Moving slowly or fidgety/restless 0 -  Suicidal thoughts 0 -  PHQ-9 Score 1 -  Difficult doing work/chores Not difficult at all -   Hypertension: BP Readings from Last 3 Encounters:  05/10/18 130/90  03/30/18 131/83  03/30/18 122/79   Obesity: Wt Readings from Last 3 Encounters:  05/10/18 234 lb 9.6 oz (106.4 kg)  03/30/18 234 lb (106.1 kg)  03/27/18 233 lb (105.7 kg)   BMI Readings from Last 3 Encounters:  05/10/18 40.27 kg/m  03/30/18 40.17 kg/m  03/27/18 39.99 kg/m    Alcohol: cutting  down, drinking at most two beers per week  Tobacco use: thinking about quitting  HIV, hep B, hep C: up to date  STD testing and prevention (chl/gon/syphilis): up to date Intimate partner violence: negative  Sexual History/Pain during Intercourse: she is homosexual, same partner for 3 years, married, no penetration since age 40.  Menstrual History/LMP/Abnormal Bleeding: heavy cycles and iron deficiency anemia, we will start her on Depo  Incontinence Symptoms: no problems.   Advanced Care Planning: A voluntary discussion about advance care planning including the explanation and discussion of advance directives.  Discussed health care proxy and Living will, and the patient was able to identify a health care proxy as Circe Chilton - wife .  Patient does not have a living will at present time.   Breast cancer: not due BRCA gene screening: N/A Cervical cancer screening: today   Lipids:  Lab Results  Component Value Date   CHOL 160 01/26/2018   Lab Results  Component Value Date   HDL 48 (L) 01/26/2018   Lab Results  Component Value Date   LDLCALC 97 01/26/2018   Lab Results  Component Value Date   TRIG 68 01/26/2018   Lab Results  Component Value Date   CHOLHDL 3.3 01/26/2018   No results found for: LDLDIRECT  Glucose:  Glucose, Bld  Date Value Ref Range Status  02/14/2018 108 (H) 65 - 99 mg/dL Final  01/26/2018 101 (H) 65 - 99 mg/dL Final    Comment:    .            Fasting reference interval . For someone without known diabetes, a glucose value between 100 and 125 mg/dL is consistent with prediabetes and should be confirmed with a follow-up test. .   07/04/2017 92 65 - 99 mg/dL Final     Colorectal cancer:  N/A Lung cancer:   Low Dose CT Chest recommended if Age 40-80 years, 30 pack-year currently smoking OR have quit w/in 15years. Patient does not qualify.   [id number]    Patient Active Problem List   Diagnosis Date Noted  . Menorrhagia with  regular cycle 05/10/2018  . Internal bleeding hemorrhoids 05/10/2018  . Migraine without aura and responsive to treatment 02/16/2018  . Iron deficiency anemia due to chronic blood loss 02/16/2018  . Hyperglycemia 01/29/2018  . Metabolic syndrome 94/70/9628  . Hypertension 01/25/2018  . GERD (gastroesophageal reflux disease) 01/25/2018  . Hypothyroid 01/25/2018  . Morbid obesity ([place]) 01/25/2018    History reviewed. No pertinent surgical history.  Family History  Problem Relation Age of Onset  . Hypertension Mother   . Hypertension Father   . Stroke Father     Social History   Socioeconomic History  . Marital status: Married    Spouse name: Windy Canny  . Number of children: 0  . Years of education: Not on file  . Highest education level: Associate degree: occupational, Hotel manager, or vocational program  Occupational History  . Occupation: Chiropractor: [employer]  . Financial resource strain: Somewhat hard  . Food insecurity:    Worry: Sometimes true    Inability: Sometimes true  . Transportation needs:    Medical: No    Non-medical: No  Tobacco Use  . Smoking status: Current Every Day Smoker    Packs/day: 0.50    Years: 18.00    Pack years: 9.00    Types: Cigarettes    Start date: 01/26/2000  . Smokeless tobacco: Never Used  . Tobacco comment: contemplating   Substance and Sexual Activity  . Alcohol use: Yes    Alcohol/week: 1.2 oz    Types: 2 Cans of beer per week  . Drug use: No  . Sexual activity: Yes    Birth control/protection: Other-see comments    Comment: homosexual   Lifestyle  . Physical activity:    Days per week: 0 days    Minutes per session: 0 min  . Stress: Only a little  Relationships  . Social connections:    Talks on phone: More than three times a week    Gets together: Once a week    Attends religious service: More than 4 times per year    Active member of club or organization: No    Attends meetings of clubs  or organizations: Never    Relationship status: Living with partner  . Intimate partner violence:    Fear of current or ex partner: No    Emotionally abused: No    Physically abused: No    Forced sexual activity: No  Other Topics Concern  . Not on file  Social History Narrative   She moved to [place] from [place], [place] in the Summer of 2018   She lives with her girlfriend - homosexual - together for the past 3 years   She does not have any  children, but girlfriend has an adult daughter that lives with them.    She is now working at [employer]     Current Outpatient Medications:  .  albuterol (PROVENTIL HFA;VENTOLIN HFA) 108 (90 Base) MCG/ACT inhaler, Inhale 1-2 puffs into the lungs every 6 (six) hours as needed for wheezing or shortness of breath., Disp: , Rfl:  .  amLODipine (NORVASC) 5 MG tablet, Take 1 tablet (5 mg total) by mouth daily., Disp: 90 tablet, Rfl: 0 .  baclofen (LIORESAL) 20 MG tablet, Take 1 tablet (20 mg total) by mouth 3 (three) times daily. Prn migraine, Disp: 30 each, Rfl: 0 .  Cetirizine HCl 10 MG CAPS, Take 1 capsule (10 mg total) by mouth daily., Disp: 30 capsule, Rfl: 0 .  cholecalciferol (VITAMIN D) 1000 units tablet, Take 2,000 Units by mouth daily., Disp: , Rfl:  .  dicyclomine (BENTYL) 20 MG tablet, Take 1 tablet (20 mg total) by mouth 4 (four) times daily -  before meals and at bedtime. (Patient taking differently: Take 20 mg by mouth as needed. ), Disp: 40 tablet, Rfl: 0 .  ferrous sulfate 325 (65 FE) MG tablet, Take 1 tablet (325 mg total) by mouth daily with breakfast., Disp: 90 tablet, Rfl: 3 .  levothyroxine (SYNTHROID, LEVOTHROID) 100 MCG tablet, Take 1 tablet (100 mcg total) by mouth every morning., Disp: 90 tablet, Rfl: 0 .  pantoprazole (PROTONIX) 40 MG tablet, Take 1 tablet (40 mg total) by mouth daily., Disp: 30 tablet, Rfl: 2 .  hydrochlorothiazide (HYDRODIURIL) 12.5 MG tablet, Take 1 tablet (12.5 mg total) by mouth daily., Disp: 90 tablet, Rfl: 0 .   SUMAtriptan (IMITREX) 100 MG tablet, Take 1 tablet (100 mg total) by mouth every 2 (two) hours as needed for migraine. May repeat in 2 hours if headache persists or recurs. (Patient not taking: Reported on 05/10/2018), Disp: 10 tablet, Rfl: 0  Allergies  Allergen Reactions  . Celebrex [Celecoxib]     "shakes/tremors"  . Sesame Seed (Diagnostic) Itching  . Lisinopril Rash     ROS   Constitutional: Negative for fever or weight change.  Respiratory: Negative for cough and shortness of breath.   Cardiovascular: Negative for chest pain or palpitations.  Gastrointestinal: Negative for abdominal pain, no bowel changes.  Musculoskeletal: Negative for gait problem or joint swelling.  Skin: Negative for rash.  Neurological: Negative for dizziness or headache.  No other specific complaints in a complete review of systems (except as listed in HPI above).   Objective  Vitals:   05/10/18 1300  BP: 130/90  Pulse: 78  Resp: 16  SpO2: 97%  Weight: 234 lb 9.6 oz (106.4 kg)  Height: 5' 4" (1.626 m)    Body mass index is 40.27 kg/m.  Physical Exam  Constitutional: Patient appears well-developed and obese. No distress.  HENT: Head: Normocephalic and atraumatic. Ears: B TMs ok, no erythema or effusion; Nose: Nose normal. Mouth/Throat: Oropharynx is clear and moist. No oropharyngeal exudate.  Eyes: Conjunctivae and EOM are normal. Pupils are equal, round, and reactive to light. No scleral icterus.  Neck: Normal range of motion. Neck supple. No JVD present. No thyromegaly present.  Cardiovascular: Normal rate, regular rhythm and normal heart sounds.  No murmur heard. No BLE edema. Pulmonary/Chest: Effort normal and breath sounds normal. No respiratory distress. Abdominal: Soft. Bowel sounds are normal, no distension. There is no tenderness. no masses Breast: no lumps or masses, no nipple discharge or rashes FEMALE GENITALIA:  External genitalia normal External urethra  normal Vaginal vault  normal without discharge or lesions Cervix normal without discharge or lesions - partially seen, patient uncomfortable during exam Bimanual exam normal without masses RECTAL: no rectal masses or hemorrhoids Musculoskeletal: Normal range of motion, no joint effusions. No gross deformities Neurological: he is alert and oriented to person, place, and time. No cranial nerve deficit. Coordination, balance, strength, speech and gait are normal.  Skin: Skin is warm and dry. No rash noted. No erythema.  Psychiatric: Patient has a normal mood and affect. behavior is normal. Judgment and thought content normal.   Recent Results (from the past 2160 hour(s))  Urinalysis, Complete w Microscopic     Status: Abnormal   Collection Time: 02/14/18  8:32 AM  Result Value Ref Range   Color, Urine YELLOW (A) YELLOW   APPearance HAZY (A) CLEAR   Specific Gravity, Urine 1.014 1.005 - 1.030   pH 6.0 5.0 - 8.0   Glucose, UA NEGATIVE NEGATIVE mg/dL   Hgb urine dipstick NEGATIVE NEGATIVE   Bilirubin Urine NEGATIVE NEGATIVE   Ketones, ur NEGATIVE NEGATIVE mg/dL   Protein, ur NEGATIVE NEGATIVE mg/dL   Nitrite NEGATIVE NEGATIVE   Leukocytes, UA TRACE (A) NEGATIVE   RBC / HPF 0-5 0 - 5 RBC/hpf   WBC, UA 0-5 0 - 5 WBC/hpf   Bacteria, UA NONE SEEN NONE SEEN   Squamous Epithelial / LPF 6-30 (A) NONE SEEN    Comment: Performed at [facility], [address] [place], [place] [address]  Pregnancy, urine     Status: None   Collection Time: 02/14/18  8:32 AM  Result Value Ref Range   Preg Test, Ur NEGATIVE NEGATIVE    Comment: Performed at [facility], [address] [place], [place] [address]  Lipase, blood     Status: None   Collection Time: 02/14/18  9:29 AM  Result Value Ref Range   Lipase 28 11 - 51 U/L    Comment: Performed at [facility], [address] [place], [place] [address]  Comprehensive metabolic panel     Status: Abnormal   Collection Time: 02/14/18  9:29 AM   Result Value Ref Range   Sodium 136 135 - 145 mmol/L   Potassium 3.9 3.5 - 5.1 mmol/L   Chloride 105 101 - 111 mmol/L   CO2 21 (L) 22 - 32 mmol/L   Glucose, Bld 108 (H) 65 - 99 mg/dL   BUN 11 6 - 20 mg/dL   Creatinine, Ser 0.65 0.44 - 1.00 mg/dL   Calcium 9.0 8.9 - 10.3 mg/dL   Total Protein 8.2 (H) 6.5 - 8.1 g/dL   Albumin 3.9 3.5 - 5.0 g/dL   AST 32 15 - 41 U/L   ALT 25 14 - 54 U/L   Alkaline Phosphatase 74 38 - 126 U/L   Total Bilirubin 0.4 0.3 - 1.2 mg/dL   GFR calc non Af Amer >60 >60 mL/min   GFR calc Af Amer >60 >60 mL/min    Comment: (NOTE) The eGFR has been calculated using the CKD EPI equation. This calculation has not been validated in all clinical situations. eGFR's persistently <60 mL/min signify possible Chronic Kidney Disease.    Anion gap 10 5 - 15    Comment: Performed at [facility], [address] [place], [place] [address]  CBC     Status: Abnormal   Collection Time: 02/14/18  9:29 AM  Result Value Ref Range   WBC 6.6 3.6 - 11.0 K/uL  RBC 5.01 3.80 - 5.20 MIL/uL   Hemoglobin 10.4 (L) 12.0 - 16.0 g/dL   HCT 33.2 (L) 35.0 - 47.0 %   MCV 66.2 (L) 80.0 - 100.0 fL   MCH 20.8 (L) 26.0 - 34.0 pg   MCHC 31.3 (L) 32.0 - 36.0 g/dL   RDW 19.1 (H) 11.5 - 14.5 %   Platelets 421 150 - 440 K/uL    Comment: Performed at [facility], [address], [place], [place] [address]  Folate     Status: None   Collection Time: 02/18/18 12:04 PM  Result Value Ref Range   Folate 11.3 >5.9 ng/mL    Comment: Performed at [facility], [address] [place], [place] [address]  Vitamin B12     Status: None   Collection Time: 02/18/18 12:04 PM  Result Value Ref Range   Vitamin B-12 368 180 - 914 pg/mL    Comment: (NOTE) This assay is not validated for testing neonatal or myeloproliferative syndrome specimens for Vitamin B12 levels. Performed at [facility], [place] [address], [place], [place] [address]   Ferritin     Status: Abnormal    Collection Time: 02/18/18 12:04 PM  Result Value Ref Range   Ferritin 4 (L) 11 - 307 ng/mL    Comment: Performed at [facility], [address] [place], [place] [address]  Iron and TIBC     Status: Abnormal   Collection Time: 02/18/18 12:04 PM  Result Value Ref Range   Iron 36 28 - 170 ug/dL   TIBC 489 (H) 250 - 450 ug/dL   Saturation Ratios 7 (L) 10.4 - 31.8 %   UIBC 453 ug/dL    Comment: Performed at [facility], [address] [place], [place] [address]  CBC with Differential/Platelet     Status: Abnormal   Collection Time: 02/18/18 12:04 PM  Result Value Ref Range   WBC 6.5 3.6 - 11.0 K/uL   RBC 4.92 3.80 - 5.20 MIL/uL   Hemoglobin 10.1 (L) 12.0 - 16.0 g/dL   HCT 32.2 (L) 35.0 - 47.0 %   MCV 65.5 (L) 80.0 - 100.0 fL   MCH 20.6 (L) 26.0 - 34.0 pg   MCHC 31.5 (L) 32.0 - 36.0 g/dL   RDW 18.7 (H) 11.5 - 14.5 %   Platelets 366 150 - 440 K/uL   Neutrophils Relative % 60 %   Neutro Abs 3.9 1.4 - 6.5 K/uL   Lymphocytes Relative 31 %   Lymphs Abs 2.1 1.0 - 3.6 K/uL   Monocytes Relative 7 %   Monocytes Absolute 0.5 0.2 - 0.9 K/uL   Eosinophils Relative 1 %   Eosinophils Absolute 0.1 0 - 0.7 K/uL   Basophils Relative 1 %   Basophils Absolute 0.0 0 - 0.1 K/uL    Comment: Performed at [facility], [address] [place], [place] [address]  Ferritin     Status: None   Collection Time: 03/28/18  3:25 PM  Result Value Ref Range   Ferritin 75 11 - 307 ng/mL    Comment: Performed at [facility], [address] [place], [place] [address]  Iron and TIBC     Status: None   Collection Time: 03/28/18  3:25 PM  Result Value Ref Range   Iron 55 28 - 170 ug/dL   TIBC 320 250 - 450 ug/dL   Saturation Ratios 17 10.4 - 31.8 %   UIBC 265 ug/dL  Comment: Performed at [facility], [address] [place], [place] [address]  CBC with Differential/Platelet     Status: Abnormal   Collection Time: 03/28/18  3:25 PM  Result Value Ref  Range   WBC 7.4 3.6 - 11.0 K/uL   RBC 4.99 3.80 - 5.20 MIL/uL   Hemoglobin 11.8 (L) 12.0 - 16.0 g/dL   HCT 36.8 35.0 - 47.0 %   MCV 73.7 (L) 80.0 - 100.0 fL   MCH 23.7 (L) 26.0 - 34.0 pg   MCHC 32.1 32.0 - 36.0 g/dL   RDW 27.1 (H) 11.5 - 14.5 %   Platelets 290 150 - 440 K/uL   Neutrophils Relative % 56 %   Neutro Abs 4.1 1.4 - 6.5 K/uL   Lymphocytes Relative 34 %   Lymphs Abs 2.5 1.0 - 3.6 K/uL   Monocytes Relative 8 %   Monocytes Absolute 0.6 0.2 - 0.9 K/uL   Eosinophils Relative 1 %   Eosinophils Absolute 0.1 0 - 0.7 K/uL   Basophils Relative 1 %   Basophils Absolute 0.1 0 - 0.1 K/uL    Comment: Performed at [facility], [address] [place], [place] [address]      PHQ2/9: Depression screen [facility] 2/9 05/10/2018 01/25/2018  Decreased Interest 0 0  Down, Depressed, Hopeless 0 0  PHQ - 2 Score 0 0  Altered sleeping 1 -  Tired, decreased energy 0 -  Change in appetite 0 -  Feeling bad or failure about yourself  0 -  Trouble concentrating 0 -  Moving slowly or fidgety/restless 0 -  Suicidal thoughts 0 -  PHQ-9 Score 1 -  Difficult doing work/chores Not difficult at all -     Fall Risk: Fall Risk  05/10/2018 03/10/2018 03/10/2018 02/16/2018 01/25/2018  Falls in the past year? _0      Functional Status Survey: Is the patient deaf or have difficulty hearing?: No Does the patient have difficulty seeing, even when wearing glasses/contacts?: Yes(she needs an eye exam) Does the patient have difficulty concentrating, remembering, or making decisions?: No Does the patient have difficulty walking or climbing stairs?: No Does the patient have difficulty dressing or bathing?: No Does the patient have difficulty doing errands alone such as visiting a doctor's office or shopping?: No  I personally reviewed Family, Social and Surgical history with the patient/caregiver today.   Assessment & Plan  1. Well woman exam  Discussed importance of 150 minutes of  physical activity weekly, eat two servings of fish weekly, eat one serving of tree nuts ( cashews, pistachios, pecans, almonds.Marland Kitchen) every other day, eat 6 servings of fruit/vegetables daily and drink plenty of water and avoid sweet beverages.   2. Essential hypertension  - amLODipine (NORVASC) 5 MG tablet; Take 1 tablet (5 mg total) by mouth daily.  Dispense: 90 tablet; Refill: 0  3. Gastroesophageal reflux disease without esophagitis   4. Menorrhagia with regular cycle  She is willing to try depo for cycle control, discussed IUD but she is not interested, not a candidate for ocp's - smoker and HTN, should not be on estrogen.  Discussed possible side effects with patient such as weight gain, DVT, spotting, breast tenderness.   5. Internal bleeding hemorrhoids  Doing better now   6. Iron deficiency anemia  Seeing hematologist, had iron infusion, last level improved  7. Cervical cancer screening  - Pap IG, CT/NG NAA, and HPV (high risk)  8. Morbid obesity ([place])  Discussed with  the patient the risk posed by an increased BMI. Discussed importance of portion control, calorie counting and at least 150 minutes of physical activity weekly. Avoid sweet beverages and drink more water. Eat at least 6 servings of fruit and vegetables daily

## 2018-05-10 NOTE — Addendum Note (Signed)
Addended by: Steele Sizer F on: 05/10/2018 04:32 PM   Modules accepted: Level of Service

## 2018-05-10 NOTE — Patient Instructions (Signed)
Preventive Care 18-39 Years, Female Preventive care refers to lifestyle choices and visits with your health care provider that can promote health and wellness. What does preventive care include?  A yearly physical exam. This is also called an annual well check.  Dental exams once or twice a year.  Routine eye exams. Ask your health care provider how often you should have your eyes checked.  Personal lifestyle choices, including: ? Daily care of your teeth and gums. ? Regular physical activity. ? Eating a healthy diet. ? Avoiding tobacco and drug use. ? Limiting alcohol use. ? Practicing safe sex. ? Taking vitamin and mineral supplements as recommended by your health care provider. What happens during an annual well check? The services and screenings done by your health care provider during your annual well check will depend on your age, overall health, lifestyle risk factors, and family history of disease. Counseling Your health care provider may ask you questions about your:  Alcohol use.  Tobacco use.  Drug use.  Emotional well-being.  Home and relationship well-being.  Sexual activity.  Eating habits.  Work and work Statistician.  Method of birth control.  Menstrual cycle.  Pregnancy history.  Screening You may have the following tests or measurements:  Height, weight, and BMI.  Diabetes screening. This is done by checking your blood sugar (glucose) after you have not eaten for a while (fasting).  Blood pressure.  Lipid and cholesterol levels. These may be checked every 5 years starting at age 66.  Skin check.  Hepatitis C blood test.  Hepatitis B blood test.  Sexually transmitted disease (STD) testing.  BRCA-related cancer screening. This may be done if you have a family history of breast, ovarian, tubal, or peritoneal cancers.  Pelvic exam and Pap test. This may be done every 3 years starting at age 40. Starting at age 59, this may be done every 5  years if you have a Pap test in combination with an HPV test.  Discuss your test results, treatment options, and if necessary, the need for more tests with your health care provider. Vaccines Your health care provider may recommend certain vaccines, such as:  Influenza vaccine. This is recommended every year.  Tetanus, diphtheria, and acellular pertussis (Tdap, Td) vaccine. You may need a Td booster every 10 years.  Varicella vaccine. You may need this if you have not been vaccinated.  HPV vaccine. If you are 69 or younger, you may need three doses over 6 months.  Measles, mumps, and rubella (MMR) vaccine. You may need at least one dose of MMR. You may also need a second dose.  Pneumococcal 13-valent conjugate (PCV13) vaccine. You may need this if you have certain conditions and were not previously vaccinated.  Pneumococcal polysaccharide (PPSV23) vaccine. You may need one or two doses if you smoke cigarettes or if you have certain conditions.  Meningococcal vaccine. One dose is recommended if you are age 27-21 years and a first-year college student living in a residence hall, or if you have one of several medical conditions. You may also need additional booster doses.  Hepatitis A vaccine. You may need this if you have certain conditions or if you travel or work in places where you may be exposed to hepatitis A.  Hepatitis B vaccine. You may need this if you have certain conditions or if you travel or work in places where you may be exposed to hepatitis B.  Haemophilus influenzae type b (Hib) vaccine. You may need this if  you have certain risk factors.  Talk to your health care provider about which screenings and vaccines you need and how often you need them. This information is not intended to replace advice given to you by your health care provider. Make sure you discuss any questions you have with your health care provider. Document Released: 01/26/2002 Document Revised: 08/19/2016  Document Reviewed: 10/01/2015 Elsevier Interactive Patient Education  [employer].

## 2018-05-11 LAB — PAP IG, CT-NG NAA, HPV HIGH-RISK
C. trachomatis RNA, TMA: NOT DETECTED
HPV DNA High Risk: NOT DETECTED
N. gonorrhoeae RNA, TMA: NOT DETECTED

## 2018-05-27 ENCOUNTER — Encounter: Payer: Self-pay | Admitting: Gastroenterology

## 2018-05-27 ENCOUNTER — Ambulatory Visit: Payer: BLUE CROSS/BLUE SHIELD | Admitting: Gastroenterology

## 2018-06-04 ENCOUNTER — Encounter: Payer: Self-pay | Admitting: Emergency Medicine

## 2018-06-04 ENCOUNTER — Emergency Department: Payer: BLUE CROSS/BLUE SHIELD

## 2018-06-04 ENCOUNTER — Other Ambulatory Visit: Payer: Self-pay

## 2018-06-04 ENCOUNTER — Emergency Department
Admission: EM | Admit: 2018-06-04 | Discharge: 2018-06-04 | Disposition: A | Discharge status: Home / Self Care | Payer: BLUE CROSS/BLUE SHIELD | Attending: Emergency Medicine | Admitting: Emergency Medicine

## 2018-06-04 DIAGNOSIS — S99911A Unspecified injury of right ankle, initial encounter: Secondary | ICD-10-CM | POA: Diagnosis present

## 2018-06-04 DIAGNOSIS — Z79899 Other long term (current) drug therapy: Secondary | ICD-10-CM | POA: Insufficient documentation

## 2018-06-04 DIAGNOSIS — Y939 Activity, unspecified: Secondary | ICD-10-CM | POA: Diagnosis not present

## 2018-06-04 DIAGNOSIS — S93401A Sprain of unspecified ligament of right ankle, initial encounter: Secondary | ICD-10-CM | POA: Diagnosis not present

## 2018-06-04 DIAGNOSIS — Y999 Unspecified external cause status: Secondary | ICD-10-CM | POA: Insufficient documentation

## 2018-06-04 DIAGNOSIS — Y929 Unspecified place or not applicable: Secondary | ICD-10-CM | POA: Insufficient documentation

## 2018-06-04 DIAGNOSIS — X58XXXA Exposure to other specified factors, initial encounter: Secondary | ICD-10-CM | POA: Insufficient documentation

## 2018-06-04 DIAGNOSIS — I10 Essential (primary) hypertension: Secondary | ICD-10-CM | POA: Insufficient documentation

## 2018-06-04 DIAGNOSIS — F17210 Nicotine dependence, cigarettes, uncomplicated: Secondary | ICD-10-CM | POA: Insufficient documentation

## 2018-06-04 DIAGNOSIS — E039 Hypothyroidism, unspecified: Secondary | ICD-10-CM | POA: Insufficient documentation

## 2018-06-04 LAB — URINALYSIS, COMPLETE (UACMP) WITH MICROSCOPIC
Bilirubin Urine: NEGATIVE
Glucose, UA: NEGATIVE mg/dL
Hgb urine dipstick: NEGATIVE
Ketones, ur: NEGATIVE mg/dL
Leukocytes, UA: NEGATIVE
Nitrite: NEGATIVE
Protein, ur: NEGATIVE mg/dL
Specific Gravity, Urine: 1.021 (ref 1.005–1.030)
WBC, UA: NONE SEEN WBC/hpf (ref 0–5)
pH: 5 (ref 5.0–8.0)

## 2018-06-04 MED ORDER — DICLOFENAC SODIUM 50 MG PO TBEC: 50.0000 mg | DELAYED_RELEASE_TABLET | Freq: Two times a day (BID) | ORAL | 0 refills | Status: DC

## 2018-06-04 MED ORDER — CYCLOBENZAPRINE HCL 5 MG PO TABS: 5.0000 mg | ORAL_TABLET | Freq: Three times a day (TID) | ORAL | 0 refills | Status: DC | PRN

## 2018-06-04 MED ORDER — DICLOFENAC SODIUM 50 MG PO TBEC: 50.0000 mg | DELAYED_RELEASE_TABLET | Freq: Two times a day (BID) | ORAL | 0 refills | Status: AC

## 2018-06-04 NOTE — Discharge Instructions (Addendum)
Your exam, x-ray, and urinalysis are all essentially normal at this time. Take the prescription meds as directed. Follow-up with Podiatry or ongoing symptoms.

## 2018-06-04 NOTE — ED Triage Notes (Signed)
R ankle pain x 2 days, denies injury.

## 2018-06-05 NOTE — ED Provider Notes (Signed)
[facility] Emergency Department Provider Note ____________________________________________  Time seen: 2031  I have reviewed the triage vital signs and the nursing notes.  HISTORY  Chief Complaint  Ankle Pain  HPI Lynn Wilson is a 37 y.o. female presents to the ED for evaluation of right ankle pain, for the last 2 days. She denies any recent injury, but reports a sprain-type injury 2 months prior. She did not seek treatment at that time, but denies any real disability. She presents now with some ankle swelling and admits to pain along the right hip and lower back. She wonders if it is due a change in her gait pattern. She denies any foot drop, distal paresthesias, or incontinence.   Past Medical History:  Diagnosis Date  . GERD (gastroesophageal reflux disease)   . Hypertension   . Hypothyroid   . Hypothyroid 01/25/2018    Patient Active Problem List   Diagnosis Date Noted  . Menorrhagia with regular cycle 05/10/2018  . Internal bleeding hemorrhoids 05/10/2018  . Migraine without aura and responsive to treatment 02/16/2018  . Iron deficiency anemia due to chronic blood loss 02/16/2018  . Hyperglycemia 01/29/2018  . Metabolic syndrome 36/14/4315  . Hypertension 01/25/2018  . GERD (gastroesophageal reflux disease) 01/25/2018  . Hypothyroid 01/25/2018  . Morbid obesity ([place]) 01/25/2018    History reviewed. No pertinent surgical history.  Prior to Admission medications   Medication Sig Start Date End Date Taking? Authorizing Provider  albuterol (PROVENTIL HFA;VENTOLIN HFA) 108 (90 Base) MCG/ACT inhaler Inhale 1-2 puffs into the lungs every 6 (six) hours as needed for wheezing or shortness of breath.    [provider]  amLODipine (NORVASC) 5 MG tablet Take 1 tablet (5 mg total) by mouth daily. 05/10/18   Steele Sizer, MD  baclofen (LIORESAL) 20 MG tablet Take 1 tablet (20 mg total) by mouth 3 (three) times daily. Prn migraine 02/16/18    Steele Sizer, MD  Cetirizine HCl 10 MG CAPS Take 1 capsule (10 mg total) by mouth daily. 03/27/18   Triplett, Johnette Abraham B, FNP  cholecalciferol (VITAMIN D) 1000 units tablet Take 2,000 Units by mouth daily.    [provider]  cyclobenzaprine (FLEXERIL) 5 MG tablet Take 1 tablet (5 mg total) by mouth 3 (three) times daily as needed for muscle spasms. 06/04/18   Roselynne Lortz, Dannielle Karvonen, PA-C  diclofenac (VOLTAREN) 50 MG EC tablet Take 1 tablet (50 mg total) by mouth 2 (two) times daily for 15 days. 06/04/18 06/19/18  Tearsa Kowalewski, Dannielle Karvonen, PA-C  dicyclomine (BENTYL) 20 MG tablet Take 1 tablet (20 mg total) by mouth 4 (four) times daily -  before meals and at bedtime. Patient taking differently: Take 20 mg by mouth as needed.  08/30/15   Rolland Porter, MD  ferrous sulfate 325 (65 FE) MG tablet Take 1 tablet (325 mg total) by mouth daily with breakfast. 02/16/18   Ancil Boozer, Drue Stager, MD  hydrochlorothiazide (HYDRODIURIL) 12.5 MG tablet Take 1 tablet (12.5 mg total) by mouth daily. 05/10/18   Steele Sizer, MD  levothyroxine (SYNTHROID, LEVOTHROID) 100 MCG tablet Take 1 tablet (100 mcg total) by mouth every morning. 05/10/18   Steele Sizer, MD  medroxyPROGESTERone (DEPO-PROVERA) 150 MG/ML injection Inject 1 mL (150 mg total) into the muscle every 3 (three) months. 05/10/18   Steele Sizer, MD  pantoprazole (PROTONIX) 40 MG tablet Take 1 tablet (40 mg total) by mouth daily. 01/25/18   Steele Sizer, MD  SUMAtriptan (IMITREX) 100 MG tablet Take 1  tablet (100 mg total) by mouth every 2 (two) hours as needed for migraine. May repeat in 2 hours if headache persists or recurs. Patient not taking: Reported on 05/10/2018 02/16/18   Steele Sizer, MD    Allergies Celebrex [celecoxib]; Sesame seed (diagnostic); and Lisinopril  Family History  Problem Relation Age of Onset  . Hypertension Mother   . Hypertension Father   . Stroke Father     Social History Social History   Tobacco Use  . Smoking status:  Current Every Day Smoker    Packs/day: 0.50    Years: 18.00    Pack years: 9.00    Types: Cigarettes    Start date: 01/26/2000  . Smokeless tobacco: Never Used  . Tobacco comment: contemplating   Substance Use Topics  . Alcohol use: Yes    Alcohol/week: 1.2 oz    Types: 2 Cans of beer per week  . Drug use: No    Review of Systems  Constitutional: Negative for fever. Cardiovascular: Negative for chest pain. Respiratory: Negative for shortness of breath. Musculoskeletal: Negative for back pain. Right ankle pain as above Skin: Negative for rash. Neurological: Negative for headaches, focal weakness or numbness. ____________________________________________  PHYSICAL EXAM:  VITAL SIGNS: ED Triage Vitals  Enc Vitals Group     BP 06/04/18 1900 (!) 146/89     Pulse Rate 06/04/18 1900 94     Resp 06/04/18 1900 18     Temp 06/04/18 1900 97.7 F (36.5 C)     Temp Source 06/04/18 1900 Oral     SpO2 06/04/18 1900 95 %     Weight 06/04/18 1901 239 lb (108.4 kg)     Height 06/04/18 1901 5\' 4"  (1.626 m)     Head Circumference --      Peak Flow --      Pain Score 06/04/18 1901 6     Pain Loc --      Pain Edu? --      Excl. in [place]? --     Constitutional: Alert and oriented. Well appearing and in no distress. Head: Normocephalic and atraumatic. Neck: Supple. No thyromegaly. Cardiovascular: Normal rate, regular rhythm. Normal distal pulses. Respiratory: Normal respiratory effort. No wheezes/rales/rhonchi. Musculoskeletal: Right ankle with subtle soft tissue swelling noted medially laterally.  Patient with a normal ankle exam with good anterior/posterior drawer.  No calf or Achilles tenderness is appreciated.  Patient is mildly tender to palpation to the medial aspect of the ankle.  Normal spinal alignment without midline tenderness, spasm, deformity, or step-off.  Normal range of motion of the right hip on exam.  Nontender with normal range of motion in all extremities.  Neurologic:  Cranial nerves II through XII grossly intact.  Normal LE DTRs bilaterally.  Normal gait without ataxia. Normal speech and language. No gross focal neurologic deficits are appreciated. Skin:  Skin is warm, dry and intact. No rash noted. ____________________________________________   LABS (pertinent positives/negatives)  Labs Reviewed  URINALYSIS, COMPLETE (UACMP) WITH MICROSCOPIC - Abnormal; Notable for the following components:      Result Value   Color, Urine YELLOW (*)    APPearance HAZY (*)    Bacteria, UA RARE (*)    All other components within normal limits  ____________________________________________   RADIOLOGY  Right Ankle  IMPRESSION: Normal right ankle. ____________________________________________  INITIAL IMPRESSION / ASSESSMENT AND PLAN / ED COURSE  She with ED evaluation of a 2-day complaint of right ankle pain without known recent injury.  Patient exam is  overall benign.  Her urinalysis is also reassuring at this time.  X-ray does not reveal any acute fracture dislocation.  Patient is being treated for a grade 1 ankle sprain is placed in Ace bandage for support.  She will follow-up with Dr. Caryl Comes in podiatry, for ongoing symptoms.  Return precautions have been reviewed.  Prescriptions for cyclobenzaprine and diclofenac are provided to dose as directed. ____________________________________________  FINAL CLINICAL IMPRESSION(S) / ED DIAGNOSES  Final diagnoses:  Sprain of right ankle, unspecified ligament, initial encounter      Melvenia Needles, PA-C 06/05/18 1913    Harvest Dark, MD 06/05/18 2153

## 2018-06-28 ENCOUNTER — Encounter: Payer: Self-pay | Admitting: Emergency Medicine

## 2018-06-28 ENCOUNTER — Emergency Department
Admission: EM | Admit: 2018-06-28 | Discharge: 2018-06-28 | Disposition: A | Discharge status: Home / Self Care | Payer: BLUE CROSS/BLUE SHIELD | Attending: Emergency Medicine | Admitting: Emergency Medicine

## 2018-06-28 ENCOUNTER — Emergency Department: Payer: BLUE CROSS/BLUE SHIELD

## 2018-06-28 DIAGNOSIS — Z79899 Other long term (current) drug therapy: Secondary | ICD-10-CM | POA: Diagnosis not present

## 2018-06-28 DIAGNOSIS — F17210 Nicotine dependence, cigarettes, uncomplicated: Secondary | ICD-10-CM | POA: Insufficient documentation

## 2018-06-28 DIAGNOSIS — E039 Hypothyroidism, unspecified: Secondary | ICD-10-CM | POA: Insufficient documentation

## 2018-06-28 DIAGNOSIS — K625 Hemorrhage of anus and rectum: Secondary | ICD-10-CM | POA: Insufficient documentation

## 2018-06-28 DIAGNOSIS — I10 Essential (primary) hypertension: Secondary | ICD-10-CM | POA: Diagnosis not present

## 2018-06-28 DIAGNOSIS — R1032 Left lower quadrant pain: Secondary | ICD-10-CM

## 2018-06-28 DIAGNOSIS — R109 Unspecified abdominal pain: Secondary | ICD-10-CM | POA: Diagnosis present

## 2018-06-28 LAB — COMPREHENSIVE METABOLIC PANEL
ALT: 28 U/L (ref 0–44)
AST: 31 U/L (ref 15–41)
Albumin: 4.2 g/dL (ref 3.5–5.0)
Alkaline Phosphatase: 83 U/L (ref 38–126)
Anion gap: 7 (ref 5–15)
BUN: 10 mg/dL (ref 6–20)
CO2: 24 mmol/L (ref 22–32)
Calcium: 8.8 mg/dL — ABNORMAL LOW (ref 8.9–10.3)
Chloride: 106 mmol/L (ref 98–111)
Creatinine, Ser: 0.67 mg/dL (ref 0.44–1.00)
GFR calc Af Amer: >60 mL/min (ref >60)
GFR calc non Af Amer: >60 mL/min (ref >60)
Glucose, Bld: 112 mg/dL — ABNORMAL HIGH (ref 70–99)
Potassium: 3.6 mmol/L (ref 3.5–5.1)
Sodium: 137 mmol/L (ref 135–145)
Total Bilirubin: 0.3 mg/dL (ref 0.3–1.2)
Total Protein: 8 g/dL (ref 6.5–8.1)

## 2018-06-28 LAB — URINALYSIS, COMPLETE (UACMP) WITH MICROSCOPIC
Bacteria, UA: NONE SEEN
Bilirubin Urine: NEGATIVE
Glucose, UA: NEGATIVE mg/dL
Hgb urine dipstick: NEGATIVE
Ketones, ur: NEGATIVE mg/dL
Leukocytes, UA: NEGATIVE
Nitrite: NEGATIVE
Protein, ur: NEGATIVE mg/dL
Specific Gravity, Urine: 1.02 (ref 1.005–1.030)
pH: 5 (ref 5.0–8.0)

## 2018-06-28 LAB — CBC
HCT: 39.4 % (ref 35.0–47.0)
Hemoglobin: 13.8 g/dL (ref 12.0–16.0)
MCH: 28.2 pg (ref 26.0–34.0)
MCHC: 35.1 g/dL (ref 32.0–36.0)
MCV: 80.3 fL (ref 80.0–100.0)
Platelets: 291 10*3/uL (ref 150–440)
RBC: 4.9 MIL/uL (ref 3.80–5.20)
RDW: 15.1 % — ABNORMAL HIGH (ref 11.5–14.5)
WBC: 7.2 10*3/uL (ref 3.6–11.0)

## 2018-06-28 LAB — POCT PREGNANCY, URINE: Preg Test, Ur: NEGATIVE

## 2018-06-28 LAB — LIPASE, BLOOD: Lipase: 35 U/L (ref 11–51)

## 2018-06-28 LAB — TROPONIN I: Troponin I: <0.03 ng/mL (ref <0.03)

## 2018-06-28 MED ORDER — ONDANSETRON HCL 4 MG/2ML IJ SOLN
4.0000 mg | Freq: Once | INTRAMUSCULAR | Status: AC
  Administered 2018-06-28: 4 mg via INTRAVENOUS

## 2018-06-28 MED ORDER — IOHEXOL 300 MG/ML  SOLN
100.0000 mL | Freq: Once | INTRAMUSCULAR | Status: AC | PRN
  Administered 2018-06-28: 100 mL via INTRAVENOUS
  Filled 2018-06-28: qty 100

## 2018-06-28 MED ORDER — MORPHINE SULFATE (PF) 4 MG/ML IV SOLN
4.0000 mg | Freq: Once | INTRAVENOUS | Status: AC
  Administered 2018-06-28: 4 mg via INTRAVENOUS

## 2018-06-28 MED ORDER — ONDANSETRON HCL 4 MG/2ML IJ SOLN
INTRAMUSCULAR | Status: AC
  Administered 2018-06-28: 4 mg via INTRAVENOUS
  Filled 2018-06-28: qty 2

## 2018-06-28 MED ORDER — HYDROCODONE-ACETAMINOPHEN 5-325 MG PO TABS: 1.0000 | ORAL_TABLET | ORAL | 0 refills | Status: DC | PRN

## 2018-06-28 MED ORDER — MORPHINE SULFATE (PF) 4 MG/ML IV SOLN
INTRAVENOUS | Status: AC
  Administered 2018-06-28: 4 mg via INTRAVENOUS
  Filled 2018-06-28: qty 1

## 2018-06-28 MED ORDER — AMOXICILLIN-POT CLAVULANATE 875-125 MG PO TABS: 1.0000 | ORAL_TABLET | Freq: Two times a day (BID) | ORAL | 0 refills | Status: AC

## 2018-06-28 MED ORDER — AMOXICILLIN-POT CLAVULANATE 875-125 MG PO TABS
1.0000 | ORAL_TABLET | Freq: Once | ORAL | Status: AC
  Administered 2018-06-28: 1 via ORAL
  Filled 2018-06-28 (×2): qty 1

## 2018-06-28 NOTE — ED Notes (Addendum)
Pt reports LUQ abdominal pain x2 days with N/V. Pt also reports pain with urination and blood in her stool. Dr Kerman Passey at bedside at this time.

## 2018-06-28 NOTE — ED Provider Notes (Signed)
[facility] Emergency Department Provider Note  Time seen: 8:34 PM  I have reviewed the triage vital signs and the nursing notes.   HISTORY  Chief Complaint Abdominal Pain    HPI Lynn Wilson is a 37 y.o. female with a past medical history of hypertension, gastric reflux, prior diverticulitis 1 year ago presents to the emergency department for 2 days of left-sided abdominal pain and blood in her stool.  According to the patient for the past 2 days she has had moderate dull aching pain across her entire left abdomen, also states today she noted blood within her stool.  States similar event happened 1 year ago when she was diagnosed with diverticulitis, received medication and that went away.  States occasional nausea but denies any vomiting, does state loose stool today.  Denies dysuria or hematuria.  No fever.   Past Medical History:  Diagnosis Date  . GERD (gastroesophageal reflux disease)   . Hypertension   . Hypothyroid   . Hypothyroid 01/25/2018    Patient Active Problem List   Diagnosis Date Noted  . Menorrhagia with regular cycle 05/10/2018  . Internal bleeding hemorrhoids 05/10/2018  . Migraine without aura and responsive to treatment 02/16/2018  . Iron deficiency anemia due to chronic blood loss 02/16/2018  . Hyperglycemia 01/29/2018  . Metabolic syndrome 86/57/8469  . Hypertension 01/25/2018  . GERD (gastroesophageal reflux disease) 01/25/2018  . Hypothyroid 01/25/2018  . Morbid obesity ([place]) 01/25/2018    History reviewed. No pertinent surgical history.  Prior to Admission medications   Medication Sig Start Date End Date Taking? Authorizing Provider  albuterol (PROVENTIL HFA;VENTOLIN HFA) 108 (90 Base) MCG/ACT inhaler Inhale 1-2 puffs into the lungs every 6 (six) hours as needed for wheezing or shortness of breath.    [provider]  amLODipine (NORVASC) 5 MG tablet Take 1 tablet (5 mg total) by mouth daily. 05/10/18   Steele Sizer, MD  baclofen (LIORESAL) 20 MG tablet Take 1 tablet (20 mg total) by mouth 3 (three) times daily. Prn migraine 02/16/18   Steele Sizer, MD  Cetirizine HCl 10 MG CAPS Take 1 capsule (10 mg total) by mouth daily. 03/27/18   Triplett, Johnette Abraham B, FNP  cholecalciferol (VITAMIN D) 1000 units tablet Take 2,000 Units by mouth daily.    [provider]  cyclobenzaprine (FLEXERIL) 5 MG tablet Take 1 tablet (5 mg total) by mouth 3 (three) times daily as needed for muscle spasms. 06/04/18   Menshew, Dannielle Karvonen, PA-C  dicyclomine (BENTYL) 20 MG tablet Take 1 tablet (20 mg total) by mouth 4 (four) times daily -  before meals and at bedtime. Patient taking differently: Take 20 mg by mouth as needed.  08/30/15   Rolland Porter, MD  ferrous sulfate 325 (65 FE) MG tablet Take 1 tablet (325 mg total) by mouth daily with breakfast. 02/16/18   Ancil Boozer, Drue Stager, MD  hydrochlorothiazide (HYDRODIURIL) 12.5 MG tablet Take 1 tablet (12.5 mg total) by mouth daily. 05/10/18   Steele Sizer, MD  levothyroxine (SYNTHROID, LEVOTHROID) 100 MCG tablet Take 1 tablet (100 mcg total) by mouth every morning. 05/10/18   Steele Sizer, MD  medroxyPROGESTERone (DEPO-PROVERA) 150 MG/ML injection Inject 1 mL (150 mg total) into the muscle every 3 (three) months. 05/10/18   Steele Sizer, MD  pantoprazole (PROTONIX) 40 MG tablet Take 1 tablet (40 mg total) by mouth daily. 01/25/18   Steele Sizer, MD  SUMAtriptan (IMITREX) 100 MG tablet Take 1 tablet (100 mg total) by  mouth every 2 (two) hours as needed for migraine. May repeat in 2 hours if headache persists or recurs. Patient not taking: Reported on 05/10/2018 02/16/18   Steele Sizer, MD    Allergies  Allergen Reactions  . Celebrex [Celecoxib]     "shakes/tremors"  . Sesame Seed (Diagnostic) Itching  . Lisinopril Rash    Family History  Problem Relation Age of Onset  . Hypertension Mother   . Hypertension Father   . Stroke Father     Social History Social  History   Tobacco Use  . Smoking status: Current Every Day Smoker    Packs/day: 0.50    Years: 18.00    Pack years: 9.00    Types: Cigarettes    Start date: 01/26/2000  . Smokeless tobacco: Never Used  . Tobacco comment: contemplating   Substance Use Topics  . Alcohol use: Yes    Alcohol/week: 1.2 oz    Types: 2 Cans of beer per week  . Drug use: No    Review of Systems Constitutional: Negative for fever. Cardiovascular: Negative for chest pain. Respiratory: Negative for shortness of breath. Gastrointestinal: Dull left-sided abdominal pain, positive for nausea negative for vomiting, occasional loose stool somewhat bloody today. Genitourinary: Negative for urinary compaints Musculoskeletal: Negative for musculoskeletal complaints Skin: Negative for skin complaints  Neurological: Negative for headache All other ROS negative  ____________________________________________   PHYSICAL EXAM:  VITAL SIGNS: ED Triage Vitals  Enc Vitals Group     BP 06/28/18 1905 140/88     Pulse Rate 06/28/18 1905 76     Resp 06/28/18 1905 16     Temp 06/28/18 1905 98.3 F (36.8 C)     Temp Source 06/28/18 1905 Oral     SpO2 06/28/18 1905 100 %     Weight 06/28/18 1904 234 lb (106.1 kg)     Height --      Head Circumference --      Peak Flow --      Pain Score 06/28/18 2029 6     Pain Loc --      Pain Edu? --      Excl. in [place]? --     Constitutional: Alert and oriented. Well appearing and in no distress. Eyes: Normal exam ENT   Head: Normocephalic and atraumatic.   Mouth/Throat: Mucous membranes are moist. Cardiovascular: Normal rate, regular rhythm. No murmur Respiratory: Normal respiratory effort without tachypnea nor retractions. Breath sounds are clear Gastrointestinal: Soft, mild left upper quadrant tenderness palpation, no rebound or guarding, no distention, abdomen otherwise benign. Musculoskeletal: Nontender with normal range of motion in all extremities.  Neurologic:   Normal speech and language. No gross focal neurologic deficits Skin:  Skin is warm, dry and intact.  Psychiatric: Mood and affect are normal.     EKG reviewed and interpreted by myself shows normal sinus rhythm at 74 bpm with a narrow QRS, normal axis, normal intervals, no ST changes.  RADIOLOGY  CT negative for acute abnormality  ____________________________________________   INITIAL IMPRESSION / ASSESSMENT AND PLAN / ED COURSE  Pertinent labs & imaging results that were available during my care of the patient were reviewed by me and considered in my medical decision making (see chart for details).  Patient presents to the emergency department for left-sided abdominal pain and blood in her stool.  Differential would include diverticulitis, colitis, lower GI bleed.  Rectal examination shows light brown stool but guaiac positive.  Labs are largely within normal limits including a normal  white blood cell count.  Will obtain CT imaging to evaluate for intra-abdominal pathology/diverticulitis.  Patient agreeable to plan of care.  CT scan negative for acute abnormality.  Given the patient's guaiac positive stool and left-sided pain we will cover with Augmentin as a precaution.  We will discharge with short course of pain medication.  Patient states she has follow-up with GI medicine in approximately 3 weeks.  I discussed with the patient if the bleeding worsens or her pain worsens or she develops a fever she is to return to the emergency department.  Patient agreeable to plan of care.  ____________________________________________   FINAL CLINICAL IMPRESSION(S) / ED DIAGNOSES  Left-sided abdominal pain   Harvest Dark, MD 06/28/18 2200

## 2018-06-28 NOTE — ED Triage Notes (Signed)
Pt c/o upper left quadrant abdominal pain x2 days, accompanied by nausea and rectal bleeding when wiping. Pt sts similar symptoms in past due to diverticulitis. Pt has appointment in August with GI specialist for issue.

## 2018-06-28 NOTE — ED Notes (Signed)
Patient transported to CT at this time. 

## 2018-06-28 NOTE — ED Notes (Signed)
Butch, RN at bedside to chaperone rectal exam for Dr Kerman Passey at this time.

## 2018-07-04 ENCOUNTER — Inpatient Hospital Stay: Payer: BLUE CROSS/BLUE SHIELD | Attending: Oncology

## 2018-07-04 DIAGNOSIS — D500 Iron deficiency anemia secondary to blood loss (chronic): Secondary | ICD-10-CM

## 2018-07-04 LAB — CBC WITH DIFFERENTIAL/PLATELET
Basophils Absolute: 0 10*3/uL (ref 0–0.1)
Basophils Relative: 0 %
Eosinophils Absolute: 0.1 10*3/uL (ref 0–0.7)
Eosinophils Relative: 2 %
HCT: 39.5 % (ref 35.0–47.0)
Hemoglobin: 13.4 g/dL (ref 12.0–16.0)
Lymphocytes Relative: 31 %
Lymphs Abs: 2.1 10*3/uL (ref 1.0–3.6)
MCH: 27.1 pg (ref 26.0–34.0)
MCHC: 33.8 g/dL (ref 32.0–36.0)
MCV: 80.1 fL (ref 80.0–100.0)
Monocytes Absolute: 0.5 10*3/uL (ref 0.2–0.9)
Monocytes Relative: 7 %
Neutro Abs: 3.9 10*3/uL (ref 1.4–6.5)
Neutrophils Relative %: 60 %
Platelets: 303 10*3/uL (ref 150–440)
RBC: 4.94 MIL/uL (ref 3.80–5.20)
RDW: 15 % — ABNORMAL HIGH (ref 11.5–14.5)
WBC: 6.6 10*3/uL (ref 3.6–11.0)

## 2018-07-04 LAB — IRON AND TIBC
Iron: 44 ug/dL (ref 28–170)
Saturation Ratios: 13 % (ref 10.4–31.8)
TIBC: 353 ug/dL (ref 250–450)
UIBC: 309 ug/dL

## 2018-07-04 LAB — FERRITIN: Ferritin: 14 ng/mL (ref 11–307)

## 2018-07-05 ENCOUNTER — Other Ambulatory Visit: Payer: Self-pay

## 2018-07-05 DIAGNOSIS — R109 Unspecified abdominal pain: Secondary | ICD-10-CM | POA: Insufficient documentation

## 2018-07-05 DIAGNOSIS — M549 Dorsalgia, unspecified: Secondary | ICD-10-CM | POA: Diagnosis not present

## 2018-07-05 DIAGNOSIS — R110 Nausea: Secondary | ICD-10-CM | POA: Insufficient documentation

## 2018-07-05 DIAGNOSIS — Z5321 Procedure and treatment not carried out due to patient leaving prior to being seen by health care provider: Secondary | ICD-10-CM | POA: Insufficient documentation

## 2018-07-05 LAB — CBC
HCT: 38.3 % (ref 35.0–47.0)
Hemoglobin: 13.6 g/dL (ref 12.0–16.0)
MCH: 28.4 pg (ref 26.0–34.0)
MCHC: 35.5 g/dL (ref 32.0–36.0)
MCV: 80.1 fL (ref 80.0–100.0)
Platelets: 284 10*3/uL (ref 150–440)
RBC: 4.78 MIL/uL (ref 3.80–5.20)
RDW: 14.8 % — ABNORMAL HIGH (ref 11.5–14.5)
WBC: 9.6 10*3/uL (ref 3.6–11.0)

## 2018-07-05 LAB — URINALYSIS, COMPLETE (UACMP) WITH MICROSCOPIC
Bacteria, UA: NONE SEEN
Bilirubin Urine: NEGATIVE
Glucose, UA: NEGATIVE mg/dL
Ketones, ur: NEGATIVE mg/dL
Leukocytes, UA: NEGATIVE
Nitrite: NEGATIVE
Protein, ur: NEGATIVE mg/dL
Specific Gravity, Urine: 1.016 (ref 1.005–1.030)
pH: 6 (ref 5.0–8.0)

## 2018-07-05 NOTE — ED Triage Notes (Signed)
Pt in with co back pain that radiates to abd, was here for the same and dx with diverticulitis. States was given antibiotics, co nausea but no vomiting or diarrhea.

## 2018-07-06 ENCOUNTER — Inpatient Hospital Stay: Payer: BLUE CROSS/BLUE SHIELD | Admitting: Oncology

## 2018-07-06 ENCOUNTER — Inpatient Hospital Stay: Payer: BLUE CROSS/BLUE SHIELD

## 2018-07-06 ENCOUNTER — Emergency Department
Admission: EM | Admit: 2018-07-06 | Discharge: 2018-07-06 | Disposition: A | Discharge status: Home / Self Care | Payer: BLUE CROSS/BLUE SHIELD | Attending: Emergency Medicine | Admitting: Emergency Medicine

## 2018-07-06 ENCOUNTER — Emergency Department
Admission: EM | Admit: 2018-07-06 | Discharge: 2018-07-06 | Disposition: A | Discharge status: Home / Self Care | Payer: BLUE CROSS/BLUE SHIELD | Source: Home / Self Care | Attending: Emergency Medicine | Admitting: Emergency Medicine

## 2018-07-06 ENCOUNTER — Encounter: Payer: Self-pay | Admitting: Emergency Medicine

## 2018-07-06 ENCOUNTER — Other Ambulatory Visit: Payer: Self-pay

## 2018-07-06 DIAGNOSIS — N898 Other specified noninflammatory disorders of vagina: Secondary | ICD-10-CM

## 2018-07-06 DIAGNOSIS — E039 Hypothyroidism, unspecified: Secondary | ICD-10-CM | POA: Insufficient documentation

## 2018-07-06 DIAGNOSIS — Z79899 Other long term (current) drug therapy: Secondary | ICD-10-CM

## 2018-07-06 DIAGNOSIS — I10 Essential (primary) hypertension: Secondary | ICD-10-CM

## 2018-07-06 DIAGNOSIS — R110 Nausea: Secondary | ICD-10-CM | POA: Insufficient documentation

## 2018-07-06 DIAGNOSIS — F17210 Nicotine dependence, cigarettes, uncomplicated: Secondary | ICD-10-CM

## 2018-07-06 DIAGNOSIS — R1030 Lower abdominal pain, unspecified: Secondary | ICD-10-CM | POA: Insufficient documentation

## 2018-07-06 LAB — COMPREHENSIVE METABOLIC PANEL
ALT: 50 U/L — ABNORMAL HIGH (ref 0–44)
AST: 68 U/L — ABNORMAL HIGH (ref 15–41)
Albumin: 4.1 g/dL (ref 3.5–5.0)
Alkaline Phosphatase: 111 U/L (ref 38–126)
Anion gap: 7 (ref 5–15)
BUN: 12 mg/dL (ref 6–20)
CO2: 28 mmol/L (ref 22–32)
Calcium: 9.2 mg/dL (ref 8.9–10.3)
Chloride: 104 mmol/L (ref 98–111)
Creatinine, Ser: 0.7 mg/dL (ref 0.44–1.00)
GFR calc Af Amer: >60 mL/min (ref >60)
GFR calc non Af Amer: >60 mL/min (ref >60)
Glucose, Bld: 95 mg/dL (ref 70–99)
Potassium: 3.6 mmol/L (ref 3.5–5.1)
Sodium: 139 mmol/L (ref 135–145)
Total Bilirubin: 0.5 mg/dL (ref 0.3–1.2)
Total Protein: 8.6 g/dL — ABNORMAL HIGH (ref 6.5–8.1)

## 2018-07-06 LAB — WET PREP, GENITAL
Clue Cells Wet Prep HPF POC: NONE SEEN
Sperm: NONE SEEN
Trich, Wet Prep: NONE SEEN
Yeast Wet Prep HPF POC: NONE SEEN

## 2018-07-06 LAB — POCT PREGNANCY, URINE: Preg Test, Ur: NEGATIVE

## 2018-07-06 LAB — LIPASE, BLOOD: Lipase: 31 U/L (ref 11–51)

## 2018-07-06 LAB — CHLAMYDIA/NGC RT PCR (ARMC ONLY)
Chlamydia Tr: NOT DETECTED
N gonorrhoeae: NOT DETECTED

## 2018-07-06 NOTE — ED Provider Notes (Signed)
[facility] Emergency Department Provider Note  Time seen: 2:44 PM  I have reviewed the triage vital signs and the nursing notes.   HISTORY  Chief Complaint Flank Pain    HPI Lynn Wilson is a 37 y.o. female with a past medical history of gastric reflux, hypertension, fibroids, presents to the emergency department for lower abdominal discomfort.  Patient states this is been ongoing issue for the past several weeks.  Patient was seen in the emergency department for the same 06/28/2018.  States she continues to have discomfort so she came back to the emergency department.  Also notes white vaginal discharge.  Denies any vaginal bleeding.  Denies any dysuria.  States nausea but denies any vomiting or diarrhea.  Denies any fever.  Describes abdominal pain as moderate cramping across the entire lower abdomen.   Past Medical History:  Diagnosis Date  . GERD (gastroesophageal reflux disease)   . Hypertension   . Hypothyroid   . Hypothyroid 01/25/2018    Patient Active Problem List   Diagnosis Date Noted  . Menorrhagia with regular cycle 05/10/2018  . Internal bleeding hemorrhoids 05/10/2018  . Migraine without aura and responsive to treatment 02/16/2018  . Iron deficiency anemia due to chronic blood loss 02/16/2018  . Hyperglycemia 01/29/2018  . Metabolic syndrome 02/58/5277  . Hypertension 01/25/2018  . GERD (gastroesophageal reflux disease) 01/25/2018  . Hypothyroid 01/25/2018  . Morbid obesity ([place]) 01/25/2018    History reviewed. No pertinent surgical history.  Prior to Admission medications   Medication Sig Start Date End Date Taking? Authorizing Provider  albuterol (PROVENTIL HFA;VENTOLIN HFA) 108 (90 Base) MCG/ACT inhaler Inhale 1-2 puffs into the lungs every 6 (six) hours as needed for wheezing or shortness of breath.    [provider]  amLODipine (NORVASC) 5 MG tablet Take 1 tablet (5 mg total) by mouth daily. 05/10/18   Steele Sizer, MD  amoxicillin-clavulanate (AUGMENTIN) 875-125 MG tablet Take 1 tablet by mouth 2 (two) times daily for 10 days. 06/28/18 07/08/18  Harvest Dark, MD  baclofen (LIORESAL) 20 MG tablet Take 1 tablet (20 mg total) by mouth 3 (three) times daily. Prn migraine 02/16/18   Steele Sizer, MD  Cetirizine HCl 10 MG CAPS Take 1 capsule (10 mg total) by mouth daily. 03/27/18   Triplett, Johnette Abraham B, FNP  cholecalciferol (VITAMIN D) 1000 units tablet Take 2,000 Units by mouth daily.    [provider]  cyclobenzaprine (FLEXERIL) 5 MG tablet Take 1 tablet (5 mg total) by mouth 3 (three) times daily as needed for muscle spasms. 06/04/18   Menshew, Dannielle Karvonen, PA-C  dicyclomine (BENTYL) 20 MG tablet Take 1 tablet (20 mg total) by mouth 4 (four) times daily -  before meals and at bedtime. Patient taking differently: Take 20 mg by mouth as needed.  08/30/15   Rolland Porter, MD  ferrous sulfate 325 (65 FE) MG tablet Take 1 tablet (325 mg total) by mouth daily with breakfast. 02/16/18   Ancil Boozer, Drue Stager, MD  hydrochlorothiazide (HYDRODIURIL) 12.5 MG tablet Take 1 tablet (12.5 mg total) by mouth daily. 05/10/18   Steele Sizer, MD  HYDROcodone-acetaminophen (NORCO/VICODIN) 5-325 MG tablet Take 1 tablet by mouth every 4 (four) hours as needed. 06/28/18   Harvest Dark, MD  levothyroxine (SYNTHROID, LEVOTHROID) 100 MCG tablet Take 1 tablet (100 mcg total) by mouth every morning. 05/10/18   Steele Sizer, MD  medroxyPROGESTERone (DEPO-PROVERA) 150 MG/ML injection Inject 1 mL (150 mg total) into the muscle every  3 (three) months. 05/10/18   Steele Sizer, MD  pantoprazole (PROTONIX) 40 MG tablet Take 1 tablet (40 mg total) by mouth daily. 01/25/18   Steele Sizer, MD  SUMAtriptan (IMITREX) 100 MG tablet Take 1 tablet (100 mg total) by mouth every 2 (two) hours as needed for migraine. May repeat in 2 hours if headache persists or recurs. Patient not taking: Reported on 05/10/2018 02/16/18   Steele Sizer,  MD    Allergies  Allergen Reactions  . Celebrex [Celecoxib]     "shakes/tremors"  . Sesame Seed (Diagnostic) Itching  . Lisinopril Rash    Family History  Problem Relation Age of Onset  . Hypertension Mother   . Hypertension Father   . Stroke Father     Social History Social History   Tobacco Use  . Smoking status: Current Every Day Smoker    Packs/day: 0.50    Years: 18.00    Pack years: 9.00    Types: Cigarettes    Start date: 01/26/2000  . Smokeless tobacco: Never Used  . Tobacco comment: contemplating   Substance Use Topics  . Alcohol use: Yes    Alcohol/week: 1.2 oz    Types: 2 Cans of beer per week  . Drug use: No    Review of Systems Constitutional: Negative for fever. Cardiovascular: Negative for chest pain. Respiratory: Negative for shortness of breath. Gastrointestinal: Lower abdominal cramping.  Positive for nausea negative for vomiting or diarrhea Genitourinary: Negative for urinary compaints Musculoskeletal: Negative for musculoskeletal complaints Skin: Negative for skin complaints  Neurological: Negative for headache All other ROS negative  ____________________________________________   PHYSICAL EXAM:  VITAL SIGNS: ED Triage Vitals  Enc Vitals Group     BP 07/06/18 1231 (!) 142/97     Pulse Rate 07/06/18 1231 88     Resp 07/06/18 1231 18     Temp 07/06/18 1231 98.3 F (36.8 C)     Temp Source 07/06/18 1231 Oral     SpO2 07/06/18 1231 98 %     Weight 07/06/18 1235 234 lb (106.1 kg)     Height 07/06/18 1235 5\' 4"  (1.626 m)     Head Circumference --      Peak Flow --      Pain Score 07/06/18 1234 7     Pain Loc --      Pain Edu? --      Excl. in [place]? --    Constitutional: Alert and oriented. Well appearing and in no distress. Eyes: Normal exam ENT   Head: Normocephalic and atraumatic.   Mouth/Throat: Mucous membranes are moist. Cardiovascular: Normal rate, regular rhythm. No murmur Respiratory: Normal respiratory effort  without tachypnea nor retractions. Breath sounds are clear Gastrointestinal: Soft, mild suprapubic tenderness, no rebound or guarding or distention. Musculoskeletal: Nontender with normal range of motion in all extremities.  Neurologic:  Normal speech and language. No gross focal neurologic deficits Skin:  Skin is warm, dry and intact.  Psychiatric: Mood and affect are normal.   ____________________________________________   INITIAL IMPRESSION / ASSESSMENT AND PLAN / ED COURSE  Pertinent labs & imaging results that were available during my care of the patient were reviewed by me and considered in my medical decision making (see chart for details).  Patient presents emergency department for several weeks of lower abdominal discomfort.  I saw the patient 06/28/2018 she had a negative work-up including a CT scan of her abdomen/pelvis.  Today patient continues with similar discomfort described as cramping across the lower  abdomen.  Slight suprapubic tenderness otherwise benign abdominal exam.  Given the report of mild clear/white discharge we will perform pelvic examination.  States she has not been sexually active since she was 37 years old.  Patient was seen here last night in the emergency department but left prior to being seen.  Her work-up yesterday was negative including normal blood work and urinalysis.  I will perform a pelvic examination today, do not believe the patient requires further imaging given radiation risk and normal labs with recent negative CT.  Wet prep negative.  We will discharge with PCP follow-up.  ____________________________________________   FINAL CLINICAL IMPRESSION(S) / ED DIAGNOSES  Lower abdominal pain    Harvest Dark, MD 07/06/18 1504

## 2018-07-06 NOTE — ED Triage Notes (Signed)
Pt to ED c/o bilateral flank x2 days that radiates to mid pelvis.  States was seen last week for abd pain and given ABX.  States clear discharge and pain with urination.  No protocol lab work at this time per Dr. Kerman Passey.

## 2018-07-06 NOTE — ED Notes (Signed)
Informed RN that patient has been roomed and is ready for evaluation.  Patient in NAD at this time and call bell placed within reach.   

## 2018-07-11 ENCOUNTER — Ambulatory Visit: Payer: BLUE CROSS/BLUE SHIELD | Admitting: Family Medicine

## 2018-07-11 ENCOUNTER — Encounter: Payer: Self-pay | Admitting: Family Medicine

## 2018-07-11 VITALS — BP 130/78 | HR 85 | Temp 98.2°F | Resp 16 | Ht 64.0 in | Wt 237.8 lb

## 2018-07-11 DIAGNOSIS — F17200 Nicotine dependence, unspecified, uncomplicated: Secondary | ICD-10-CM

## 2018-07-11 DIAGNOSIS — R1030 Lower abdominal pain, unspecified: Secondary | ICD-10-CM

## 2018-07-11 DIAGNOSIS — R7303 Prediabetes: Secondary | ICD-10-CM

## 2018-07-11 DIAGNOSIS — E039 Hypothyroidism, unspecified: Secondary | ICD-10-CM

## 2018-07-11 DIAGNOSIS — I10 Essential (primary) hypertension: Secondary | ICD-10-CM

## 2018-07-11 DIAGNOSIS — G43009 Migraine without aura, not intractable, without status migrainosus: Secondary | ICD-10-CM

## 2018-07-11 DIAGNOSIS — R0602 Shortness of breath: Secondary | ICD-10-CM

## 2018-07-11 DIAGNOSIS — R748 Abnormal levels of other serum enzymes: Secondary | ICD-10-CM

## 2018-07-11 DIAGNOSIS — K219 Gastro-esophageal reflux disease without esophagitis: Secondary | ICD-10-CM

## 2018-07-11 MED ORDER — PANTOPRAZOLE SODIUM 40 MG PO TBEC: 40.0000 mg | DELAYED_RELEASE_TABLET | Freq: Every day | ORAL | 1 refills | Status: DC

## 2018-07-11 MED ORDER — SUMATRIPTAN SUCCINATE 100 MG PO TABS: 100.0000 mg | ORAL_TABLET | ORAL | 0 refills | Status: DC | PRN

## 2018-07-11 MED ORDER — BACLOFEN 20 MG PO TABS: 20.0000 mg | ORAL_TABLET | Freq: Three times a day (TID) | ORAL | 0 refills | Status: DC

## 2018-07-11 MED ORDER — AMLODIPINE BESYLATE 5 MG PO TABS: 5.0000 mg | ORAL_TABLET | Freq: Every day | ORAL | 0 refills | Status: DC

## 2018-07-11 MED ORDER — HYDROCHLOROTHIAZIDE 12.5 MG PO TABS: 12.5000 mg | ORAL_TABLET | Freq: Every day | ORAL | 0 refills | Status: DC

## 2018-07-11 MED ORDER — DICYCLOMINE HCL 20 MG PO TABS: 20.0000 mg | ORAL_TABLET | Freq: Three times a day (TID) | ORAL | 0 refills | Status: DC

## 2018-07-11 MED ORDER — LEVOTHYROXINE SODIUM 100 MCG PO TABS: 100.0000 ug | ORAL_TABLET | ORAL | 0 refills | Status: DC

## 2018-07-11 NOTE — Progress Notes (Signed)
Name: [place]   MRN: [id number]    DOB: 1981-03-15   Date:07/11/2018       Progress Note  Subjective  Chief Complaint  Chief Complaint  Patient presents with  . Follow-up    2 month F/U  . Migraine    Iron Infusions are helping migraines-has not had one in a month.  . Hypertension    Denies any symptoms  . Abdominal Pain    Has been in the ER and was told her Uterus was inflammed.    HPI  Migraine: doing better since iron infusion, iron storage is back to normal, anemia is now mild. Usually migraine is intense, frontal and sometimes on the top of her head, associated with nausea but no vomiting, also has photophobia and phonophobia with episodes. She would like refill of medications. No episodes in a few months   Morbid obesity: she states having difficulty cutting down on carbohydrates and sodas. Discussed importance of losing weight to improve her health. She gained 2 lbs since last visit. Discussed importance of increasing physical activity.   HTN: she used to take hctz 12.50and norvasc 5 mg and is doing well, bp is at goal. No chest pain, she has some SOB with activity.   Tobacco use: she has episodes of bronchitis and would like a refill of ventolin She has SOB with activity but denies daily cough.   Hypothyroidism: she is compliant with thyroid medication. She denies significant weight change or hair loss. No change in bowel movements.   Iron deficiency anemia: regular but heavy cycles, she never started Depo, she also has hemorrhoids and is seeing GI - needs a band. She is getting iron infusion and is feeling well. No longer has pica  Lower abdominal pain: went to [facility] twice, took Augmentin and is finally feeling better now.   Pre-diabetes: she denies polyphagia, polydipsia or polyuria. We will recheck labs.  Patient Active Problem List   Diagnosis Date Noted  . Menorrhagia with regular cycle 05/10/2018  . Internal bleeding hemorrhoids 05/10/2018  . Migraine  without aura and responsive to treatment 02/16/2018  . Iron deficiency anemia due to chronic blood loss 02/16/2018  . Hyperglycemia 01/29/2018  . Metabolic syndrome 72/53/6644  . Hypertension 01/25/2018  . GERD (gastroesophageal reflux disease) 01/25/2018  . Hypothyroid 01/25/2018  . Morbid obesity ([place]) 01/25/2018    No past surgical history on file.  Family History  Problem Relation Age of Onset  . Hypertension Mother   . Hypertension Father   . Stroke Father     Social History   Socioeconomic History  . Marital status: Married    Spouse name: Windy Canny  . Number of children: 0  . Years of education: Not on file  . Highest education level: Associate degree: occupational, Hotel manager, or vocational program  Occupational History  . Occupation: Chiropractor: [employer]  . Financial resource strain: Somewhat hard  . Food insecurity:    Worry: Sometimes true    Inability: Sometimes true  . Transportation needs:    Medical: No    Non-medical: No  Tobacco Use  . Smoking status: Current Every Day Smoker    Packs/day: 0.50    Years: 18.00    Pack years: 9.00    Types: Cigarettes    Start date: 01/26/2000  . Smokeless tobacco: Never Used  . Tobacco comment: contemplating   Substance and Sexual Activity  . Alcohol use: Yes    Alcohol/week:  1.2 oz    Types: 2 Cans of beer per week  . Drug use: No  . Sexual activity: Yes    Birth control/protection: Other-see comments    Comment: homosexual   Lifestyle  . Physical activity:    Days per week: 0 days    Minutes per session: 0 min  . Stress: Only a little  Relationships  . Social connections:    Talks on phone: More than three times a week    Gets together: Once a week    Attends religious service: More than 4 times per year    Active member of club or organization: No    Attends meetings of clubs or organizations: Never    Relationship status: Living with partner  . Intimate partner  violence:    Fear of current or ex partner: No    Emotionally abused: No    Physically abused: No    Forced sexual activity: No  Other Topics Concern  . Not on file  Social History Narrative   She moved to [place] from [place], [place] in the Summer of 2018   She lives with her girlfriend - homosexual - together for the past 3 years   She does not have any children, but girlfriend has an adult daughter that lives with them.    She is now working at [employer]     Current Outpatient Medications:  .  albuterol (PROVENTIL HFA;VENTOLIN HFA) 108 (90 Base) MCG/ACT inhaler, Inhale 1-2 puffs into the lungs every 6 (six) hours as needed for wheezing or shortness of breath., Disp: , Rfl:  .  amLODipine (NORVASC) 5 MG tablet, Take 1 tablet (5 mg total) by mouth daily., Disp: 90 tablet, Rfl: 0 .  baclofen (LIORESAL) 20 MG tablet, Take 1 tablet (20 mg total) by mouth 3 (three) times daily. Prn migraine, Disp: 30 each, Rfl: 0 .  Cetirizine HCl 10 MG CAPS, Take 1 capsule (10 mg total) by mouth daily., Disp: 30 capsule, Rfl: 0 .  cholecalciferol (VITAMIN D) 1000 units tablet, Take 2,000 Units by mouth daily., Disp: , Rfl:  .  cyclobenzaprine (FLEXERIL) 5 MG tablet, Take 1 tablet (5 mg total) by mouth 3 (three) times daily as needed for muscle spasms., Disp: 15 tablet, Rfl: 0 .  dicyclomine (BENTYL) 20 MG tablet, Take 1 tablet (20 mg total) by mouth 4 (four) times daily -  before meals and at bedtime. (Patient taking differently: Take 20 mg by mouth as needed. ), Disp: 40 tablet, Rfl: 0 .  hydrochlorothiazide (HYDRODIURIL) 12.5 MG tablet, Take 1 tablet (12.5 mg total) by mouth daily., Disp: 90 tablet, Rfl: 0 .  levothyroxine (SYNTHROID, LEVOTHROID) 100 MCG tablet, Take 1 tablet (100 mcg total) by mouth every morning., Disp: 90 tablet, Rfl: 0 .  pantoprazole (PROTONIX) 40 MG tablet, Take 1 tablet (40 mg total) by mouth daily., Disp: 30 tablet, Rfl: 2 .  SUMAtriptan (IMITREX) 100 MG tablet, Take 1 tablet (100 mg  total) by mouth every 2 (two) hours as needed for migraine. May repeat in 2 hours if headache persists or recurs., Disp: 10 tablet, Rfl: 0 .  ferrous sulfate 325 (65 FE) MG tablet, Take 1 tablet (325 mg total) by mouth daily with breakfast. (Patient not taking: Reported on 07/11/2018), Disp: 90 tablet, Rfl: 3 .  HYDROcodone-acetaminophen (NORCO/VICODIN) 5-325 MG tablet, Take 1 tablet by mouth every 4 (four) hours as needed. (Patient not taking: Reported on 07/11/2018), Disp: 12 tablet, Rfl: 0 .  medroxyPROGESTERone (DEPO-PROVERA)  150 MG/ML injection, Inject 1 mL (150 mg total) into the muscle every 3 (three) months., Disp: 1 mL, Rfl: 3  Allergies  Allergen Reactions  . Celebrex [Celecoxib]     "shakes/tremors"  . Sesame Seed (Diagnostic) Itching  . Lisinopril Rash     ROS  Constitutional: Negative for fever or weight change.  Respiratory: Negative for cough , positive for intermittent  shortness of breath.   Cardiovascular: Negative for chest pain or palpitations.   Gastrointestinal: Negative for abdominal pain, no bowel changes.  Musculoskeletal: Negative for gait problem or joint swelling.  Skin: Negative for rash.  Neurological: Negative for dizziness, positive for headache.  No other specific complaints in a complete review of systems (except as listed in HPI above).  Objective  Vitals:   07/11/18 0951  BP: 130/78  Pulse: 85  Resp: 16  Temp: 98.2 F (36.8 C)  TempSrc: Oral  SpO2: 97%  Weight: 237 lb 12.8 oz (107.9 kg)  Height: 5' 4" (1.626 m)    Body mass index is 40.82 kg/m.  Physical Exam  Constitutional: Patient appears well-developed and well-nourished. Obese  No distress.  HEENT: head atraumatic, normocephalic, pupils equal and reactive to light, neck supple, throat within normal limits Cardiovascular: Normal rate, regular rhythm and normal heart sounds.  No murmur heard. No BLE edema. Pulmonary/Chest: Effort normal and breath sounds normal. No respiratory  distress. Abdominal: Soft.  There is no tenderness. Psychiatric: Patient has a normal mood and affect. behavior is normal. Judgment and thought content normal.   Recent Results (from the past 2160 hour(s))  Pap IG, CT/NG NAA, and HPV (high risk)     Status: None   Collection Time: 05/10/18  1:56 PM  Result Value Ref Range   Clinical Information:      Comment: None given   LMP:      Comment: 04/2018   PREV. PAP:      Comment: NONE   PREV. BX:      Comment: NONE   HPV DNA Probe-Source      Comment: Endocervix   STATEMENT OF ADEQUACY:      Comment: Satisfactory for evaluation. Endocervical/transformation zone component present.    INTERPRETATION/RESULT:      Comment: Negative for intraepithelial lesion or malignancy.   Comment:      Comment: This Pap test has been evaluated with computer assisted technology.    CYTOTECHNOLOGIST:      Comment: JRW, CT(ASCP) CT screening location: [address], [place], [place] [address]    HPV DNA High Risk Not Detected Not Detect    Comment: This test was performed using the APTIMA HPV Assay ([employer] . This assay detects E6/E7 viral messenger RNA (mRNA) from 14 high-risk HPV types (16,18,31,33,35,39,45,51,52,56,58,59,66,68). . The analytical performance characteristics of this assay have been determined by [facility]. The modifications have not been cleared or approved by the FDA. This assay has been validated pursuant to the CLIA regulations and is used for clinical purposes.    C. trachomatis RNA, TMA NOT DETECTED NOT DETECT   N. gonorrhoeae RNA, TMA NOT DETECTED NOT DETECT    Comment: EXPLANATORY NOTE:  . The Pap is a screening test for cervical cancer. It is  not a diagnostic test and is subject to false negative  and false positive results. It is most reliable when a  satisfactory sample, regularly obtained, is submitted  with relevant clinical findings and history, and when  the Pap result is  evaluated along with  historic and  current clinical information. . This test was performed using the [place] ([place] . The analytical performance characteristics of this  assay, when used to test SurePath specimens have been determined by [employer]. .   Urinalysis, Complete w Microscopic     Status: Abnormal   Collection Time: 06/04/18  8:31 PM  Result Value Ref Range   Color, Urine YELLOW (A) YELLOW   APPearance HAZY (A) CLEAR   Specific Gravity, Urine 1.021 1.005 - 1.030   pH 5.0 5.0 - 8.0   Glucose, UA NEGATIVE NEGATIVE mg/dL   Hgb urine dipstick NEGATIVE NEGATIVE   Bilirubin Urine NEGATIVE NEGATIVE   Ketones, ur NEGATIVE NEGATIVE mg/dL   Protein, ur NEGATIVE NEGATIVE mg/dL   Nitrite NEGATIVE NEGATIVE   Leukocytes, UA NEGATIVE NEGATIVE   WBC, UA NONE SEEN 0 - 5 WBC/hpf   Bacteria, UA RARE (A) NONE SEEN   Squamous Epithelial / LPF 11-20 0 - 5   Mucus PRESENT     Comment: Performed at [facility], [address] [place], [place] [address]  Lipase, blood     Status: None   Collection Time: 06/28/18  7:07 PM  Result Value Ref Range   Lipase 35 11 - 51 U/L    Comment: Performed at [facility], [address] [place], [place] [address]  Comprehensive metabolic panel     Status: Abnormal   Collection Time: 06/28/18  7:07 PM  Result Value Ref Range   Sodium 137 135 - 145 mmol/L   Potassium 3.6 3.5 - 5.1 mmol/L   Chloride 106 98 - 111 mmol/L    Comment: Please note change in reference range.   CO2 24 22 - 32 mmol/L   Glucose, Bld 112 (H) 70 - 99 mg/dL    Comment: Please note change in reference range.   BUN 10 6 - 20 mg/dL    Comment: Please note change in reference range.   Creatinine, Ser 0.67 0.44 - 1.00 mg/dL   Calcium 8.8 (L) 8.9 - 10.3 mg/dL   Total Protein 8.0 6.5 - 8.1 g/dL   Albumin 4.2 3.5 - 5.0 g/dL   AST 31 15 - 41 U/L   ALT 28 0 - 44 U/L    Comment: Please note change in reference range.   Alkaline  Phosphatase 83 38 - 126 U/L   Total Bilirubin 0.3 0.3 - 1.2 mg/dL   GFR calc non Af Amer >60 >60 mL/min   GFR calc Af Amer >60 >60 mL/min    Comment: (NOTE) The eGFR has been calculated using the CKD EPI equation. This calculation has not been validated in all clinical situations. eGFR's persistently <60 mL/min signify possible Chronic Kidney Disease.    Anion gap 7 5 - 15    Comment: Performed at [facility], [address] [place], [place] [address]  CBC     Status: Abnormal   Collection Time: 06/28/18  7:07 PM  Result Value Ref Range   WBC 7.2 3.6 - 11.0 K/uL   RBC 4.90 3.80 - 5.20 MIL/uL   Hemoglobin 13.8 12.0 - 16.0 g/dL   HCT 39.4 35.0 - 47.0 %   MCV 80.3 80.0 - 100.0 fL   MCH 28.2 26.0 - 34.0 pg   MCHC 35.1 32.0 - 36.0 g/dL   RDW 15.1 (H) 11.5 - 14.5 %   Platelets 291 150 - 440 K/uL    Comment: Performed at [facility], [address] [place], [place] [address]  Urinalysis, Complete w Microscopic     Status: Abnormal   Collection Time: 06/28/18  7:07 PM  Result Value Ref Range   Color, Urine YELLOW (A) YELLOW   APPearance CLEAR (A) CLEAR   Specific Gravity, Urine 1.020 1.005 - 1.030   pH 5.0 5.0 - 8.0   Glucose, UA NEGATIVE NEGATIVE mg/dL   Hgb urine dipstick NEGATIVE NEGATIVE   Bilirubin Urine NEGATIVE NEGATIVE   Ketones, ur NEGATIVE NEGATIVE mg/dL   Protein, ur NEGATIVE NEGATIVE mg/dL   Nitrite NEGATIVE NEGATIVE   Leukocytes, UA NEGATIVE NEGATIVE   RBC / HPF 0-5 0 - 5 RBC/hpf   WBC, UA 0-5 0 - 5 WBC/hpf   Bacteria, UA NONE SEEN NONE SEEN   Squamous Epithelial / LPF 0-5 0 - 5   Mucus PRESENT     Comment: Performed at [facility], [address] [place], [place] [address]  Troponin I     Status: None   Collection Time: 06/28/18  7:07 PM  Result Value Ref Range   Troponin I <0.03 <0.03 ng/mL    Comment: Performed at [facility], [address] [place], [place] [address]  Pregnancy, urine POC     Status: None    Collection Time: 06/28/18  7:24 PM  Result Value Ref Range   Preg Test, Ur NEGATIVE NEGATIVE    Comment:        THE SENSITIVITY OF THIS METHODOLOGY IS >24 mIU/mL   Iron and TIBC     Status: None   Collection Time: 07/04/18 11:54 AM  Result Value Ref Range   Iron 44 28 - 170 ug/dL   TIBC 353 250 - 450 ug/dL   Saturation Ratios 13 10.4 - 31.8 %   UIBC 309 ug/dL    Comment: Performed at [facility], [address] [place], [place] [address]  Ferritin     Status: None   Collection Time: 07/04/18 11:54 AM  Result Value Ref Range   Ferritin 14 11 - 307 ng/mL    Comment: Performed at [facility], [address] [place], [place] [address]  CBC with Differential/Platelet     Status: Abnormal   Collection Time: 07/04/18 11:54 AM  Result Value Ref Range   WBC 6.6 3.6 - 11.0 K/uL   RBC 4.94 3.80 - 5.20 MIL/uL   Hemoglobin 13.4 12.0 - 16.0 g/dL   HCT 39.5 35.0 - 47.0 %   MCV 80.1 80.0 - 100.0 fL   MCH 27.1 26.0 - 34.0 pg   MCHC 33.8 32.0 - 36.0 g/dL   RDW 15.0 (H) 11.5 - 14.5 %   Platelets 303 150 - 440 K/uL   Neutrophils Relative % 60 %   Neutro Abs 3.9 1.4 - 6.5 K/uL   Lymphocytes Relative 31 %   Lymphs Abs 2.1 1.0 - 3.6 K/uL   Monocytes Relative 7 %   Monocytes Absolute 0.5 0.2 - 0.9 K/uL   Eosinophils Relative 2 %   Eosinophils Absolute 0.1 0 - 0.7 K/uL   Basophils Relative 0 %   Basophils Absolute 0.0 0 - 0.1 K/uL    Comment: Performed at [facility], [address] [place],  [address]  CBC     Status: Abnormal   Collection Time: 07/05/18 11:27 PM  Result Value Ref Range   WBC 9.6 3.6 - 11.0 K/uL   RBC 4.78 3.80 - 5.20 MIL/uL   Hemoglobin 13.6 12.0 - 16.0 g/dL  HCT 38.3 35.0 - 47.0 %   MCV 80.1 80.0 - 100.0 fL   MCH 28.4 26.0 - 34.0 pg   MCHC 35.5 32.0 - 36.0 g/dL   RDW 14.8 (H) 11.5 - 14.5 %   Platelets 284 150 - 440 K/uL    Comment: Performed at [facility], [address] [place], [place] [address]  Comprehensive  metabolic panel     Status: Abnormal   Collection Time: 07/05/18 11:27 PM  Result Value Ref Range   Sodium 139 135 - 145 mmol/L   Potassium 3.6 3.5 - 5.1 mmol/L   Chloride 104 98 - 111 mmol/L   CO2 28 22 - 32 mmol/L   Glucose, Bld 95 70 - 99 mg/dL   BUN 12 6 - 20 mg/dL   Creatinine, Ser 0.70 0.44 - 1.00 mg/dL   Calcium 9.2 8.9 - 10.3 mg/dL   Total Protein 8.6 (H) 6.5 - 8.1 g/dL   Albumin 4.1 3.5 - 5.0 g/dL   AST 68 (H) 15 - 41 U/L   ALT 50 (H) 0 - 44 U/L   Alkaline Phosphatase 111 38 - 126 U/L   Total Bilirubin 0.5 0.3 - 1.2 mg/dL   GFR calc non Af Amer >60 >60 mL/min   GFR calc Af Amer >60 >60 mL/min    Comment: (NOTE) The eGFR has been calculated using the CKD EPI equation. This calculation has not been validated in all clinical situations. eGFR's persistently <60 mL/min signify possible Chronic Kidney Disease.    Anion gap 7 5 - 15    Comment: Performed at [facility], [address] [place], [place] [address]  Lipase, blood     Status: None   Collection Time: 07/05/18 11:27 PM  Result Value Ref Range   Lipase 31 11 - 51 U/L    Comment: Performed at [facility], [address] [place], [place] [address]  Urinalysis, Complete w Microscopic     Status: Abnormal   Collection Time: 07/05/18 11:27 PM  Result Value Ref Range   Color, Urine YELLOW (A) YELLOW   APPearance HAZY (A) CLEAR   Specific Gravity, Urine 1.016 1.005 - 1.030   pH 6.0 5.0 - 8.0   Glucose, UA NEGATIVE NEGATIVE mg/dL   Hgb urine dipstick SMALL (A) NEGATIVE   Bilirubin Urine NEGATIVE NEGATIVE   Ketones, ur NEGATIVE NEGATIVE mg/dL   Protein, ur NEGATIVE NEGATIVE mg/dL   Nitrite NEGATIVE NEGATIVE   Leukocytes, UA NEGATIVE NEGATIVE   RBC / HPF 0-5 0 - 5 RBC/hpf   WBC, UA 0-5 0 - 5 WBC/hpf   Bacteria, UA NONE SEEN NONE SEEN   Squamous Epithelial / LPF 6-10 0 - 5   Mucus PRESENT     Comment: Performed at [facility], [address] [place], [place] [address]  Pregnancy,  urine POC     Status: None   Collection Time: 07/06/18 12:16 AM  Result Value Ref Range   Preg Test, Ur NEGATIVE NEGATIVE    Comment:        THE SENSITIVITY OF THIS METHODOLOGY IS >24 mIU/mL   Wet prep, genital     Status: Abnormal   Collection Time: 07/06/18  2:33 PM  Result Value Ref Range   Yeast Wet Prep HPF POC NONE SEEN NONE SEEN   Trich, Wet Prep NONE SEEN NONE SEEN   Clue Cells Wet Prep HPF POC NONE SEEN NONE SEEN   WBC, Wet Prep HPF POC MODERATE (A) NONE  SEEN   Sperm NONE SEEN     Comment: Performed at [facility], [address] [place], [place] [address]  [place] rt PCR [facility] only)     Status: None   Collection Time: 07/06/18  2:33 PM  Result Value Ref Range   Specimen source GC/Chlam ENDOCERVICAL    Chlamydia Tr NOT DETECTED NOT DETECTED   N gonorrhoeae NOT DETECTED NOT DETECTED    Comment: (NOTE) 100  This methodology has not been evaluated in pregnant women or in 200  patients with a history of hysterectomy. 300 400  This methodology will not be performed on patients less than 76  years of age. Performed at [facility], [place], [place] [address]       PHQ2/9: Depression screen [facility] 2/9 07/11/2018 05/10/2018 01/25/2018  Decreased Interest 0 0 0  Down, Depressed, Hopeless 0 0 0  PHQ - 2 Score 0 0 0  Altered sleeping - 1 -  Tired, decreased energy - 0 -  Change in appetite - 0 -  Feeling bad or failure about yourself  - 0 -  Trouble concentrating - 0 -  Moving slowly or fidgety/restless - 0 -  Suicidal thoughts - 0 -  PHQ-9 Score - 1 -  Difficult doing work/chores - Not difficult at all -     Fall Risk: Fall Risk  07/11/2018 05/10/2018 03/10/2018 03/10/2018 02/16/2018  Falls in the past year? Yes No No No No  Number falls in past yr: 1 - - - -  Injury with Fall? Yes - - - -  Comment Right Ankle - - - -     Functional Status Survey: Is the patient deaf or have difficulty hearing?: No Does the patient have  difficulty seeing, even when wearing glasses/contacts?: No Does the patient have difficulty concentrating, remembering, or making decisions?: No Does the patient have difficulty walking or climbing stairs?: No Does the patient have difficulty dressing or bathing?: No Does the patient have difficulty doing errands alone such as visiting a doctor's office or shopping?: No    Assessment & Plan  1. Essential hypertension  At goal, continue medication  - amLODipine (NORVASC) 5 MG tablet; Take 1 tablet (5 mg total) by mouth daily.  Dispense: 90 tablet; Refill: 0 - hydrochlorothiazide (HYDRODIURIL) 12.5 MG tablet; Take 1 tablet (12.5 mg total) by mouth daily.  Dispense: 90 tablet; Refill: 0  2. Pre-diabetes  - Hemoglobin A1c  3. Hypothyroidism, adult  - TSH - levothyroxine (SYNTHROID, LEVOTHROID) 100 MCG tablet; Take 1 tablet (100 mcg total) by mouth every morning.  Dispense: 90 tablet; Refill: 0  4. Morbid obesity ([place])  Discussed with the patient the risk posed by an increased BMI. Discussed importance of portion control, calorie counting and at least 150 minutes of physical activity weekly. Avoid sweet beverages and drink more water. Eat at least 6 servings of fruit and vegetables daily   5. Migraine without aura and without status migrainosus, not intractable  No episodes since started iron infusion but would like a refill of medications  - SUMAtriptan (IMITREX) 100 MG tablet; Take 1 tablet (100 mg total) by mouth every 2 (two) hours as needed for migraine. May repeat in 2 hours if headache persists or recurs.  Dispense: 10 tablet; Refill: 0 - baclofen (LIORESAL) 20 MG tablet; Take 1 tablet (20 mg total) by mouth 3 (three) times daily. Prn migraine  Dispense: 30 each; Refill: 0  6. Lower abdominal pain, unspecified  Went to [facility], treated for diverticulitis and is doing well now, but states would like to have bentyl at home to take prn  - dicyclomine (BENTYL) 20 MG tablet; Take  1 tablet (20 mg total) by mouth 4 (four) times daily -  before meals and at bedtime.  Dispense: 40 tablet; Refill: 0  7. Elevated liver enzymes  - Hepatic function panel  8. Smoker  - PR EVAL OF BRONCHOSPASM Explained normal spirometry does not have to keep ventolin at home  9. SOB (shortness of breath)  - PR EVAL OF BRONCHOSPASM  10. Gastroesophageal reflux disease without esophagitis  - pantoprazole (PROTONIX) 40 MG tablet; Take 1 tablet (40 mg total) by mouth daily.  Dispense: 90 tablet; Refill: 1

## 2018-07-12 LAB — HEPATIC FUNCTION PANEL
AG Ratio: 1.4 (calc) (ref 1.0–2.5)
ALT: 28 U/L (ref 6–29)
AST: 24 U/L (ref 10–30)
Albumin: 4.2 g/dL (ref 3.6–5.1)
Alkaline phosphatase (APISO): 99 U/L (ref 33–115)
Bilirubin, Direct: 0.1 mg/dL (ref 0.0–0.2)
Globulin: 3.1 g/dL (calc) (ref 1.9–3.7)
Indirect Bilirubin: 0.3 mg/dL (calc) (ref 0.2–1.2)
Total Bilirubin: 0.4 mg/dL (ref 0.2–1.2)
Total Protein: 7.3 g/dL (ref 6.1–8.1)

## 2018-07-12 LAB — HEMOGLOBIN A1C
Hgb A1c MFr Bld: 6.1 % of total Hgb — ABNORMAL HIGH (ref <5.7)
Mean Plasma Glucose: 128 (calc)
eAG (mmol/L): 7.1 (calc)

## 2018-07-12 LAB — TSH: TSH: 1.51 mIU/L

## 2018-07-19 ENCOUNTER — Ambulatory Visit: Payer: BLUE CROSS/BLUE SHIELD | Admitting: Gastroenterology

## 2018-07-28 ENCOUNTER — Inpatient Hospital Stay: Payer: BLUE CROSS/BLUE SHIELD | Attending: Oncology

## 2018-07-28 ENCOUNTER — Other Ambulatory Visit: Payer: Self-pay | Admitting: Oncology

## 2018-07-28 DIAGNOSIS — D500 Iron deficiency anemia secondary to blood loss (chronic): Secondary | ICD-10-CM | POA: Diagnosis not present

## 2018-07-28 DIAGNOSIS — R5383 Other fatigue: Secondary | ICD-10-CM | POA: Insufficient documentation

## 2018-07-28 DIAGNOSIS — I10 Essential (primary) hypertension: Secondary | ICD-10-CM | POA: Diagnosis not present

## 2018-07-28 DIAGNOSIS — Z79899 Other long term (current) drug therapy: Secondary | ICD-10-CM | POA: Diagnosis not present

## 2018-07-28 DIAGNOSIS — Z8249 Family history of ischemic heart disease and other diseases of the circulatory system: Secondary | ICD-10-CM | POA: Insufficient documentation

## 2018-07-28 DIAGNOSIS — R109 Unspecified abdominal pain: Secondary | ICD-10-CM | POA: Insufficient documentation

## 2018-07-28 DIAGNOSIS — K625 Hemorrhage of anus and rectum: Secondary | ICD-10-CM | POA: Diagnosis not present

## 2018-07-28 DIAGNOSIS — N920 Excessive and frequent menstruation with regular cycle: Secondary | ICD-10-CM | POA: Diagnosis not present

## 2018-07-28 DIAGNOSIS — F17210 Nicotine dependence, cigarettes, uncomplicated: Secondary | ICD-10-CM | POA: Insufficient documentation

## 2018-07-28 DIAGNOSIS — D259 Leiomyoma of uterus, unspecified: Secondary | ICD-10-CM | POA: Diagnosis not present

## 2018-07-28 LAB — CBC WITH DIFFERENTIAL/PLATELET
Basophils Absolute: 0 10*3/uL (ref 0–0.1)
Basophils Relative: 1 %
Eosinophils Absolute: 0.1 10*3/uL (ref 0–0.7)
Eosinophils Relative: 2 %
HCT: 39.6 % (ref 35.0–47.0)
Hemoglobin: 13 g/dL (ref 12.0–16.0)
Lymphocytes Relative: 41 %
Lymphs Abs: 2.3 10*3/uL (ref 1.0–3.6)
MCH: 26.7 pg (ref 26.0–34.0)
MCHC: 32.9 g/dL (ref 32.0–36.0)
MCV: 81.2 fL (ref 80.0–100.0)
Monocytes Absolute: 0.4 10*3/uL (ref 0.2–0.9)
Monocytes Relative: 7 %
Neutro Abs: 2.8 10*3/uL (ref 1.4–6.5)
Neutrophils Relative %: 49 %
Platelets: 282 10*3/uL (ref 150–440)
RBC: 4.87 MIL/uL (ref 3.80–5.20)
RDW: 15.2 % — ABNORMAL HIGH (ref 11.5–14.5)
WBC: 5.6 10*3/uL (ref 3.6–11.0)

## 2018-07-28 LAB — IRON AND TIBC
Iron: 54 ug/dL (ref 28–170)
Saturation Ratios: 16 % (ref 10.4–31.8)
TIBC: 346 ug/dL (ref 250–450)
UIBC: 292 ug/dL

## 2018-07-28 LAB — FERRITIN: Ferritin: 12 ng/mL (ref 11–307)

## 2018-07-29 ENCOUNTER — Encounter: Payer: Self-pay | Admitting: Oncology

## 2018-07-29 ENCOUNTER — Other Ambulatory Visit: Payer: Self-pay

## 2018-07-29 ENCOUNTER — Inpatient Hospital Stay: Payer: BLUE CROSS/BLUE SHIELD

## 2018-07-29 ENCOUNTER — Inpatient Hospital Stay (HOSPITAL_BASED_OUTPATIENT_CLINIC_OR_DEPARTMENT_OTHER): Payer: BLUE CROSS/BLUE SHIELD | Admitting: Oncology

## 2018-07-29 VITALS — BP 117/81 | HR 80 | Resp 20

## 2018-07-29 VITALS — BP 145/93 | HR 93 | Temp 96.7°F | Resp 18 | Wt 237.0 lb

## 2018-07-29 DIAGNOSIS — K625 Hemorrhage of anus and rectum: Secondary | ICD-10-CM | POA: Diagnosis not present

## 2018-07-29 DIAGNOSIS — D259 Leiomyoma of uterus, unspecified: Secondary | ICD-10-CM | POA: Diagnosis not present

## 2018-07-29 DIAGNOSIS — I10 Essential (primary) hypertension: Secondary | ICD-10-CM

## 2018-07-29 DIAGNOSIS — R109 Unspecified abdominal pain: Secondary | ICD-10-CM | POA: Diagnosis not present

## 2018-07-29 DIAGNOSIS — N924 Excessive bleeding in the premenopausal period: Secondary | ICD-10-CM

## 2018-07-29 DIAGNOSIS — Z79899 Other long term (current) drug therapy: Secondary | ICD-10-CM

## 2018-07-29 DIAGNOSIS — D500 Iron deficiency anemia secondary to blood loss (chronic): Secondary | ICD-10-CM | POA: Diagnosis not present

## 2018-07-29 DIAGNOSIS — F17210 Nicotine dependence, cigarettes, uncomplicated: Secondary | ICD-10-CM

## 2018-07-29 DIAGNOSIS — R5383 Other fatigue: Secondary | ICD-10-CM

## 2018-07-29 DIAGNOSIS — Z8249 Family history of ischemic heart disease and other diseases of the circulatory system: Secondary | ICD-10-CM

## 2018-07-29 DIAGNOSIS — N920 Excessive and frequent menstruation with regular cycle: Secondary | ICD-10-CM

## 2018-07-29 MED ORDER — SODIUM CHLORIDE 0.9 % IV SOLN
INTRAVENOUS | Status: DC
  Administered 2018-07-29: 15:00:00 via INTRAVENOUS
  Filled 2018-07-29: qty 1000

## 2018-07-29 MED ORDER — IRON SUCROSE 20 MG/ML IV SOLN
200.0000 mg | Freq: Once | INTRAVENOUS | Status: AC
  Administered 2018-07-29: 200 mg via INTRAVENOUS
  Filled 2018-07-29: qty 10

## 2018-07-29 NOTE — Progress Notes (Signed)
Hematology/Oncology Follow up note Lynn Wilson [phone] [phone]   Patient Care Team: Lynn Sizer, MD as PCP - General (Family Medicine) Lynn Sizer, MD (Family Medicine)  REFERRING PROVIDER: Dr.Vanga Wilson REASON FOR VISIT Follow up for treatment of   iron deficiency anemia.   HISTORY OF PRESENTING ILLNESS:  Lynn Wilson is a  37 y.o.  female with PMH listed below who was referred to me for evaluation of iron deficiency anemia.  Patient was recently seen by gastroenterologist for consultation and management of severe iron deficiency anemia, chronic rectal bleeding.  She has had CT of the abdomen done which was unremarkable other than sigmoid diverticulosis.  She also has a history of intermittent rectal bleeding, diarrhea, dark stools, chronic NSAIDs use for headache which was recently stopped after GI visit. NSAIDs: Excedrin, 3-4 pills daily for chronic headaches for last 3-4 years Antiplts/Anticoagulants/Anti thrombotics: none She denies family history of GI malignancy Patient also reports heavy menstrual bleeding.  She has tried oral iron supplementations in the past. Patient has chronic anemia, most recent labs on February 18, 2018 showed hemoglobin 10.1, MCV 65.5, normal platelets and normal white blood cell count with normal differentials. Iron panel was obtained on same day which showed elevated TIBC at 489, decreased saturation ratio and 7%, decreased ferritin level at 4. Patient reports feeling tired, lack of energy.  Craving ice chips.  Intermittent abdominal pain.  Today she also has onset of menstrual and has a lot of clots. INTERVAL HISTORY Lynn Wilson is a 37 y.o. female who has above history reviewed by me today presents for follow-up of iron deficiency anemia. She has previously got IV Venofer x4.  Her fatigue has improved. Today she feels more fatigued. Also still have heavy menstrual period.  She was  supposed to start on Depakote shots which she has not yet. Also complaining lowe abdomen pain. During the interval she presented to emergency room for abdominal pain nausea and a rectal bleeding. CT abdomen pelvis was done.  She has descending and sigmoid colon diverticulosis without active diverticulitis identified. She has fundal fibroid in the uterus.  Small umbilical hernia.  She has a metal foreign body in the right lateral buttock subcutaneous tissues.    Review of Systems  Constitutional: Positive for malaise/fatigue. Negative for chills, fever and weight loss.  HENT: Negative for ear discharge, ear pain and tinnitus.   Eyes: Negative for double vision, photophobia and pain.  Respiratory: Negative for cough, hemoptysis and sputum production.   Cardiovascular: Negative for chest pain, palpitations, orthopnea and claudication.  Gastrointestinal: Negative for abdominal pain, blood in stool, diarrhea, nausea and vomiting.  Genitourinary: Positive for flank pain. Negative for dysuria and urgency.  Musculoskeletal: Negative for myalgias and neck pain.  Skin: Negative for itching and rash.  Neurological: Negative for dizziness and tingling.  Endo/Heme/Allergies: Does not bruise/bleed easily.  Psychiatric/Behavioral: Negative for depression, hallucinations, memory loss and substance abuse.    MEDICAL HISTORY:  Past Medical History:  Diagnosis Date  . GERD (gastroesophageal reflux disease)   . Hypertension   . Hypothyroid   . Hypothyroid 01/25/2018    SURGICAL HISTORY: History reviewed. No pertinent surgical history.  SOCIAL HISTORY: Social History   Socioeconomic History  . Marital status: Married    Spouse name: Lynn Wilson  . Number of children: 0  . Years of education: Not on file  . Highest education level: Associate degree: occupational, Hotel manager, or vocational program  Occupational History  . Occupation:  janitor     Employer: [employer]  Social Needs  . Financial  resource strain: Somewhat hard  . Food insecurity:    Worry: Sometimes true    Inability: Sometimes true  . Transportation needs:    Medical: No    Non-medical: No  Tobacco Use  . Smoking status: Current Every Day Smoker    Packs/day: 0.50    Years: 18.00    Pack years: 9.00    Types: Cigarettes    Start date: 01/26/2000  . Smokeless tobacco: Never Used  . Tobacco comment: contemplating   Substance and Sexual Activity  . Alcohol use: Yes    Alcohol/week: 2.0 standard drinks    Types: 2 Cans of beer per week  . Drug use: No  . Sexual activity: Yes    Birth control/protection: Other-see comments    Comment: homosexual   Lifestyle  . Physical activity:    Days per week: 0 days    Minutes per session: 0 min  . Stress: Only a little  Relationships  . Social connections:    Talks on phone: More than three times a week    Gets together: Once a week    Attends religious service: More than 4 times per year    Active member of club or organization: No    Attends meetings of clubs or organizations: Never    Relationship status: Living with partner  . Intimate partner violence:    Fear of current or ex partner: No    Emotionally abused: No    Physically abused: No    Forced sexual activity: No  Other Topics Concern  . Not on file  Social History Narrative   She moved to [place] from [place], [place] in the Summer of 2018   She lives with her girlfriend - homosexual - together for the past 3 years   She does not have any children, but girlfriend has an adult daughter that lives with them.    She is now working at [employer]: Family History  Problem Relation Age of Onset  . Hypertension Mother   . Hypertension Father   . Stroke Father     ALLERGIES:  is allergic to celebrex [celecoxib]; sesame seed (diagnostic); and lisinopril.  MEDICATIONS:  Current Outpatient Medications  Medication Sig Dispense Refill  . amLODipine (NORVASC) 5 MG tablet Take 1 tablet (5  mg total) by mouth daily. 90 tablet 0  . baclofen (LIORESAL) 20 MG tablet Take 1 tablet (20 mg total) by mouth 3 (three) times daily. Prn migraine 30 each 0  . Cetirizine HCl 10 MG CAPS Take 1 capsule (10 mg total) by mouth daily. 30 capsule 0  . cholecalciferol (VITAMIN D) 1000 units tablet Take 2,000 Units by mouth daily.    Marland Kitchen dicyclomine (BENTYL) 20 MG tablet Take 1 tablet (20 mg total) by mouth 4 (four) times daily -  before meals and at bedtime. 40 tablet 0  . hydrochlorothiazide (HYDRODIURIL) 12.5 MG tablet Take 1 tablet (12.5 mg total) by mouth daily. 90 tablet 0  . levothyroxine (SYNTHROID, LEVOTHROID) 100 MCG tablet Take 1 tablet (100 mcg total) by mouth every morning. 90 tablet 0  . pantoprazole (PROTONIX) 40 MG tablet Take 1 tablet (40 mg total) by mouth daily. 90 tablet 1  . SUMAtriptan (IMITREX) 100 MG tablet Take 1 tablet (100 mg total) by mouth every 2 (two) hours as needed for migraine. May repeat in 2 hours if headache persists or recurs.  10 tablet 0  . medroxyPROGESTERone (DEPO-PROVERA) 150 MG/ML injection Inject 1 mL (150 mg total) into the muscle every 3 (three) months. (Patient not taking: Reported on 07/29/2018) 1 mL 3   No current facility-administered medications for this visit.    Facility-Administered Medications Ordered in Other Visits  Medication Dose Route Frequency Provider Last Rate Last Dose  . 0.9 %  sodium chloride infusion   Intravenous Continuous Earlie Server, MD   Stopped at 07/29/18 1456     PHYSICAL EXAMINATION: ECOG PERFORMANCE STATUS: 1 - Symptomatic but completely ambulatory Vitals:   07/29/18 1348  BP: (!) 145/93  Pulse: 93  Resp: 18  Temp: (!) 96.7 F (35.9 C)   Filed Weights   07/29/18 1348  Weight: 237 lb (107.5 kg)    Physical Exam  Constitutional: She is oriented to person, place, and time and well-developed, well-nourished, and in no distress. No distress.  HENT:  Head: Normocephalic and atraumatic.  Nose: Nose normal.  Mouth/Throat:  Oropharynx is clear and moist. No oropharyngeal exudate.  Eyes: Pupils are equal, round, and reactive to light. EOM are normal. Left eye exhibits no discharge. No scleral icterus.  Neck: Normal range of motion. Neck supple. No JVD present.  Cardiovascular: Normal rate, regular rhythm, normal heart sounds and intact distal pulses.  No murmur heard. Pulmonary/Chest: Effort normal and breath sounds normal. No respiratory distress. She has no wheezes. She has no rales. She exhibits no tenderness.  Abdominal: Soft. Bowel sounds are normal. She exhibits no distension and no mass. There is no tenderness. There is no rebound.  Musculoskeletal: Normal range of motion. She exhibits no edema or deformity.  Lymphadenopathy:    She has no cervical adenopathy.  Neurological: She is alert and oriented to person, place, and time. No cranial nerve deficit. She exhibits normal muscle tone.  Skin: Skin is warm and dry. No rash noted. She is not diaphoretic. No erythema.  Psychiatric: Affect normal.     LABORATORY DATA:  I have reviewed the data as listed Lab Results  Component Value Date   WBC 5.6 07/28/2018   HGB 13.0 07/28/2018   HCT 39.6 07/28/2018   MCV 81.2 07/28/2018   PLT 282 07/28/2018   Recent Labs    02/14/18 0929 06/28/18 1907 07/05/18 2327 07/11/18 1041  NA 136 137 139  --   K 3.9 3.6 3.6  --   CL 105 106 104  --   CO2 21* 24 28  --   GLUCOSE 108* 112* 95  --   BUN 11 10 12   --   CREATININE 0.65 0.67 0.70  --   CALCIUM 9.0 8.8* 9.2  --   GFRNONAA >60 >60 >60  --   GFRAA >60 >60 >60  --   PROT 8.2* 8.0 8.6* 7.3  ALBUMIN 3.9 4.2 4.1  --   AST 32 31 68* 24  ALT 25 28 50* 28  ALKPHOS 74 83 111  --   BILITOT 0.4 0.3 0.5 0.4  BILIDIR  --   --   --  0.1  IBILI  --   --   --  0.3       ASSESSMENT & PLAN:  1. Iron deficiency anemia due to chronic blood loss   2. Excessive bleeding in premenopausal period   3. Uterine leiomyoma, unspecified location   4. Rectal bleeding      #Labs reviewed and discussed with patient.  Her hemoglobin and MCV has been stable. Iron panel shows borderline  ferritin at the level of 12, borderline iron saturation ratio. Consistent with ongoing chronic blood loss. Given that she has not had any intervention/treatment to treat heavy menstrual bleeding, would offer her IV Venofer x 1  Today. She can continue take her oral iron supplements as maintenance therapy.  # Ongoing blood loss/ menorrhagia/ uterus fibroid.  Advised patient to continue follow-up with primary care physician and may be's establish care with GYN physician for further evaluation and treatment of fibroid uterus and menorrhagia.  She was supposed to start Depo injections and has not started yet.  #Rectal bleeding, questionable hemorrhoids.  Was seen by Dr. Marius Ditch gastroenterology. Continue follow up.   #Avoid NSAIDs, continue Protonix #Continue follow-up with gastroenterology.  All questions were answered. The patient knows to call the clinic with any problems questions or concerns.  Return of visit: 4 months. Orders Placed This Encounter  Procedures  . CBC with Differential/Platelet  . Iron and TIBC  . Ferritin   Earlie Server, MD, PhD Hematology Oncology [facility] [place], [place] Pager- [phone] 07/29/2018

## 2018-07-29 NOTE — Progress Notes (Signed)
Patient here for follow up. C/o aching pain 6/10 to bladder, has been going on for a couple of weeks. She went to ER and they told her she has and inflammed uterus.

## 2018-08-12 ENCOUNTER — Other Ambulatory Visit: Payer: Self-pay

## 2018-08-12 ENCOUNTER — Ambulatory Visit (INDEPENDENT_AMBULATORY_CARE_PROVIDER_SITE_OTHER): Payer: BLUE CROSS/BLUE SHIELD | Admitting: Gastroenterology

## 2018-08-12 ENCOUNTER — Encounter: Payer: Self-pay | Admitting: Gastroenterology

## 2018-08-12 VITALS — BP 122/88 | HR 77 | Resp 17 | Ht 64.0 in | Wt 252.2 lb

## 2018-08-12 DIAGNOSIS — R1012 Left upper quadrant pain: Secondary | ICD-10-CM

## 2018-08-12 DIAGNOSIS — K625 Hemorrhage of anus and rectum: Secondary | ICD-10-CM

## 2018-08-12 NOTE — Progress Notes (Signed)
Cephas Darby, MD [address]  [place]  [place], [place] [address]  Main: [phone]  Fax: [phone]    Gastroenterology Consultation  Referring Provider:     Steele Sizer, MD Primary Care Physician:  Steele Sizer, MD Primary Gastroenterologist:  Dr. Cephas Darby Reason for Consultation:     Iron deficiency anemia, rectal bleeding        HPI:   Lynn Wilson is a 37 y.o. African-American female  With history of morbid obesity, hypothyroidism, and hypertension referred by Dr. Steele Sizer, MD  for consultation & management of severe iron deficiency anemia, Chronic rectal bleeding. She was in the emergency room last week secondary to left-sided abdominal pain, nausea, vomiting, diarrhea, mild rectal bleeding and underwent CT which was unremarkable other than sigmoid diverticulosis. Apparently, patient has chronic history of intermittent episodes of rectal bleeding, loose stool with dark red blood clots, left lower quadrant pain, every other month. She underwent upper endoscopy and colonoscopy in [place] in 2015 and was found to have sigmoid diverticulosis, internal hemorrhoids, hiatal hernia. She has developed iron deficiency anemia since 04/2017. Her lowest hemoglobin was 9.9, most recently 10.4, MCV 66.2, ferritin 8 in 01/2018. She saw Dr. Ancil Boozer last week who started her on oral iron supplementation, she is currently taking 1 pill a day. And, she is also taking Protonix once a day. Patient has been taking Excedrin for chronic migraine headaches for last 3-4 years, about 3-4 pills daily. She just recently started on Topamax for migraine headaches by Dr. Ancil Boozer. she had history of elevated transaminases AST greater than AST in 04/2017 which are now back to normal. This was probably in the setting of alcohol use or symptomatic cholelithiasis. She reports feeling tired, fatigued secondary to anemia. She denies active rectal bleeding, abdominal pain, nausea, vomiting,  diarrhea  Follow-up visit 03/25/2018 She saw Dr.Yu at [place] for parenteral iron therapy due to severe iron deficiency. She received 4 infusions so far. She stopped taking Excedrin.Patient reports that her energy levels are significantly better and not experiencing headaches. She is currently constipated. She is taking oral iron 325 mg daily. She liked fusion plus iron supplements and did not cause constipation and requesting more samples. She also lost about 5 pounds in last 4 weeks cutting back on high carb diet. She is not eating during middle of the night and nothing after 7 PM. She denies any other GI symptoms. She denies loss of appetite, fever, chills, nausea, vomiting. She is taking Protonix 2 times daily  Follow up visit 08/12/2018 She went to ER for 2 days history of left upper quadrant pain associated with nausea, rectal bleeding. She underwent CT A/P which revealed left-sided diverticulosis only. She did not have leukocytosis. Lipase, LFTs were normal. She notices small clots associated with bleeding per rectum intermittently.She does report intermittent constipation.she continues to take oral iron daily. She is taking Bentyl as needed for cramps  NSAIDs: currently stopped, Excedrin, 3-4 pills daily for chronic headaches for last 3-4 years  Antiplts/Anticoagulants/Anti thrombotics: none  GI Procedures:  Repeat EGD and colonoscopy in [place], in 2015 was told that she has hiatal hernia, diverticulosis, internal hemorrhoids Report not available Pathology from gastric biopsies was negative for H. Pylori gastritis. She denies family history of GI malignancy She denies abdominal surgeries She smokes cigarettes, drinks about 4 beers per week She denies IV drug abuse  Past Medical History:  Diagnosis Date  . GERD (gastroesophageal reflux disease)   . Hypertension   .  Hypothyroid   . Hypothyroid 01/25/2018    No past surgical history on file.   Current Outpatient  Medications:  .  amLODipine (NORVASC) 5 MG tablet, Take 1 tablet (5 mg total) by mouth daily., Disp: 90 tablet, Rfl: 0 .  baclofen (LIORESAL) 20 MG tablet, Take 1 tablet (20 mg total) by mouth 3 (three) times daily. Prn migraine, Disp: 30 each, Rfl: 0 .  Cetirizine HCl 10 MG CAPS, Take 1 capsule (10 mg total) by mouth daily., Disp: 30 capsule, Rfl: 0 .  cholecalciferol (VITAMIN D) 1000 units tablet, Take 2,000 Units by mouth daily., Disp: , Rfl:  .  dicyclomine (BENTYL) 20 MG tablet, Take 1 tablet (20 mg total) by mouth 4 (four) times daily -  before meals and at bedtime., Disp: 40 tablet, Rfl: 0 .  hydrochlorothiazide (HYDRODIURIL) 12.5 MG tablet, Take 1 tablet (12.5 mg total) by mouth daily., Disp: 90 tablet, Rfl: 0 .  levothyroxine (SYNTHROID, LEVOTHROID) 100 MCG tablet, Take 1 tablet (100 mcg total) by mouth every morning., Disp: 90 tablet, Rfl: 0 .  pantoprazole (PROTONIX) 40 MG tablet, Take 1 tablet (40 mg total) by mouth daily., Disp: 90 tablet, Rfl: 1 .  SUMAtriptan (IMITREX) 100 MG tablet, Take 1 tablet (100 mg total) by mouth every 2 (two) hours as needed for migraine. May repeat in 2 hours if headache persists or recurs., Disp: 10 tablet, Rfl: 0 .  medroxyPROGESTERone (DEPO-PROVERA) 150 MG/ML injection, Inject 1 mL (150 mg total) into the muscle every 3 (three) months. (Patient not taking: Reported on 07/29/2018), Disp: 1 mL, Rfl: 3   Family History  Problem Relation Age of Onset  . Hypertension Mother   . Hypertension Father   . Stroke Father      Social History   Tobacco Use  . Smoking status: Current Every Day Smoker    Packs/day: 0.50    Years: 18.00    Pack years: 9.00    Types: Cigarettes    Start date: 01/26/2000  . Smokeless tobacco: Never Used  . Tobacco comment: contemplating   Substance Use Topics  . Alcohol use: Yes    Alcohol/week: 2.0 standard drinks    Types: 2 Cans of beer per week  . Drug use: No    Allergies as of 08/12/2018 - Review Complete  08/12/2018  Allergen Reaction Noted  . Celebrex [celecoxib]  03/08/2012  . Sesame seed (diagnostic) Itching 01/25/2018  . Lisinopril Rash 03/08/2012    Review of Systems:    All systems reviewed and negative except where noted in HPI.   Physical Exam:  BP 122/88 (BP Location: Left Arm, Patient Position: Sitting, Cuff Size: Large)   Pulse 77   Resp 17   Ht 5\' 4"  (1.626 m)   Wt 252 lb 3.2 oz (114.4 kg)   BMI 43.29 kg/m  No LMP recorded.  General:   Alert,  Well-developed, well-nourished, pleasant and cooperative in NAD Head:  Normocephalic and atraumatic. Eyes:  Sclera clear, no icterus.   Conjunctiva pink. Ears:  Normal auditory acuity. Nose:  No deformity, discharge, or lesions. Mouth:  No deformity or lesions,oropharynx pink & moist. Neck:  Supple; no masses or thyromegaly. Lungs:  Respirations even and unlabored.  Clear throughout to auscultation.   No wheezes, crackles, or rhonchi. No acute distress. Heart:  Regular rate and rhythm; no murmurs, clicks, rubs, or gallops. Abdomen:  Normal bowel sounds. Soft, obesity,non-tender and non-distended,  No guarding or rebound tenderness.   Rectal: Not performed Msk:  Symmetrical without gross deformities. Good, equal movement & strength bilaterally. Pulses:  Normal pulses noted. Extremities:  No clubbing or edema.  No cyanosis. Neurologic:  Alert and oriented x3;  grossly normal neurologically. Skin:  Intact without significant lesions or rashes. No jaundice. Psych:  Alert and cooperative. Normal mood and affect.  Imaging Studies: Reviewed   Assessment and Plan:   TASHIANNA BROOME is a 37 y.o. African-American female with metabolic syndrome presents with chronic history of intermittent episodes of left upper/lower quadrant pain, rectal bleeding since 2015, heavy NSAID use for chronic headaches, found to have severe iron deficiency anemia. She had EGD and colonoscopy in 2015, there was no evidence of peptic ulcer disease. She  had a hiatal hernia, sigmoid diverticulosis and internal hemorrhoids. The etiology of iron deficiency anemia is multifactorial including NSAID-induced enteropathy, NSAID-induced gastritis, recurrent diverticular bleed in the setting of heavy Excedrin use, bleeding internal hemorrhoids. Overall, patient is clinically better. She does have recurrence of rectal bleeding and left upper quadrant pain  My recommendations are - continue to avoid NSAIDs - Continue oral iron replacement, samples on fusion plus provided and was seen by hematology, received parenteral iron in the past - continue Protonix to 40 mg twice daily - anemia resolved, however, she continues to have low iron - recommend EGD and colonoscopy for further evaluation   Follow up in 2 months   Cephas Darby, MD

## 2018-09-08 ENCOUNTER — Encounter
Admission: RE | Disposition: A | Discharge status: Home / Self Care | Payer: Self-pay | Source: Ambulatory Visit | Attending: Gastroenterology

## 2018-09-08 ENCOUNTER — Ambulatory Visit: Payer: BLUE CROSS/BLUE SHIELD | Admitting: Certified Registered Nurse Anesthetist

## 2018-09-08 ENCOUNTER — Ambulatory Visit
Admission: RE | Admit: 2018-09-08 | Discharge: 2018-09-08 | Disposition: A | Discharge status: Home / Self Care | Payer: BLUE CROSS/BLUE SHIELD | Source: Ambulatory Visit | Attending: Gastroenterology | Admitting: Gastroenterology

## 2018-09-08 ENCOUNTER — Encounter: Payer: Self-pay | Admitting: *Deleted

## 2018-09-08 DIAGNOSIS — R1012 Left upper quadrant pain: Secondary | ICD-10-CM

## 2018-09-08 DIAGNOSIS — Z79899 Other long term (current) drug therapy: Secondary | ICD-10-CM | POA: Insufficient documentation

## 2018-09-08 DIAGNOSIS — K625 Hemorrhage of anus and rectum: Secondary | ICD-10-CM | POA: Diagnosis present

## 2018-09-08 DIAGNOSIS — K2950 Unspecified chronic gastritis without bleeding: Secondary | ICD-10-CM | POA: Insufficient documentation

## 2018-09-08 DIAGNOSIS — F17210 Nicotine dependence, cigarettes, uncomplicated: Secondary | ICD-10-CM | POA: Insufficient documentation

## 2018-09-08 DIAGNOSIS — K644 Residual hemorrhoidal skin tags: Secondary | ICD-10-CM | POA: Diagnosis not present

## 2018-09-08 DIAGNOSIS — I10 Essential (primary) hypertension: Secondary | ICD-10-CM | POA: Diagnosis not present

## 2018-09-08 DIAGNOSIS — K648 Other hemorrhoids: Secondary | ICD-10-CM | POA: Insufficient documentation

## 2018-09-08 DIAGNOSIS — K219 Gastro-esophageal reflux disease without esophagitis: Secondary | ICD-10-CM | POA: Diagnosis not present

## 2018-09-08 DIAGNOSIS — E039 Hypothyroidism, unspecified: Secondary | ICD-10-CM | POA: Diagnosis not present

## 2018-09-08 DIAGNOSIS — K5730 Diverticulosis of large intestine without perforation or abscess without bleeding: Secondary | ICD-10-CM | POA: Insufficient documentation

## 2018-09-08 DIAGNOSIS — K3189 Other diseases of stomach and duodenum: Secondary | ICD-10-CM | POA: Diagnosis not present

## 2018-09-08 HISTORY — PX: COLONOSCOPY WITH PROPOFOL: SHX5780

## 2018-09-08 HISTORY — PX: ESOPHAGOGASTRODUODENOSCOPY (EGD) WITH PROPOFOL: SHX5813

## 2018-09-08 LAB — POCT PREGNANCY, URINE: Preg Test, Ur: NEGATIVE

## 2018-09-08 SURGERY — ESOPHAGOGASTRODUODENOSCOPY (EGD) WITH PROPOFOL
Anesthesia: General

## 2018-09-08 MED ORDER — LIDOCAINE HCL (CARDIAC) PF 100 MG/5ML IV SOSY
PREFILLED_SYRINGE | INTRAVENOUS | Status: DC | PRN
  Administered 2018-09-08: 50 mg via INTRAVENOUS

## 2018-09-08 MED ORDER — PROPOFOL 500 MG/50ML IV EMUL
INTRAVENOUS | Status: AC
  Filled 2018-09-08: qty 50

## 2018-09-08 MED ORDER — FENTANYL CITRATE (PF) 100 MCG/2ML IJ SOLN
INTRAMUSCULAR | Status: AC
  Filled 2018-09-08: qty 2

## 2018-09-08 MED ORDER — SODIUM CHLORIDE 0.9 % IV SOLN
INTRAVENOUS | Status: DC
  Administered 2018-09-08: 08:00:00 via INTRAVENOUS

## 2018-09-08 MED ORDER — PROPOFOL 10 MG/ML IV BOLUS
INTRAVENOUS | Status: DC | PRN
  Administered 2018-09-08: 21 mg via INTRAVENOUS
  Administered 2018-09-08: 60 mg via INTRAVENOUS

## 2018-09-08 MED ORDER — FENTANYL CITRATE (PF) 100 MCG/2ML IJ SOLN
INTRAMUSCULAR | Status: DC | PRN
  Administered 2018-09-08 (×2): 50 ug via INTRAVENOUS

## 2018-09-08 MED ORDER — MIDAZOLAM HCL 2 MG/2ML IJ SOLN
INTRAMUSCULAR | Status: AC
  Filled 2018-09-08: qty 2

## 2018-09-08 MED ORDER — PROPOFOL 500 MG/50ML IV EMUL
INTRAVENOUS | Status: DC | PRN
  Administered 2018-09-08: 100 ug/kg/min via INTRAVENOUS

## 2018-09-08 MED ORDER — MIDAZOLAM HCL 2 MG/2ML IJ SOLN
INTRAMUSCULAR | Status: DC | PRN
  Administered 2018-09-08: 2 mg via INTRAVENOUS

## 2018-09-08 MED ORDER — LIDOCAINE HCL (PF) 2 % IJ SOLN
INTRAMUSCULAR | Status: AC
  Filled 2018-09-08: qty 10

## 2018-09-08 NOTE — Anesthesia Postprocedure Evaluation (Signed)
Anesthesia Post Note  Patient: Lynn Wilson  Procedure(s) Performed: ESOPHAGOGASTRODUODENOSCOPY (EGD) WITH PROPOFOL (N/A ) COLONOSCOPY WITH PROPOFOL (N/A )  Patient location during evaluation: Endoscopy Anesthesia Type: General Level of consciousness: awake and alert Pain management: pain level controlled Vital Signs Assessment: post-procedure vital signs reviewed and stable Respiratory status: spontaneous breathing, nonlabored ventilation, respiratory function stable and patient connected to nasal cannula oxygen Cardiovascular status: blood pressure returned to baseline and stable Postop Assessment: no apparent nausea or vomiting Anesthetic complications: no     Last Vitals:  Vitals:   09/08/18 0744 09/08/18 0855  BP: (!) 143/95 (!) 94/49  Pulse: 98 (!) 104  Resp: 16 (!) 30  Temp: (!) 36.3 C (!) 36.2 C  SpO2: 100% 97%    Last Pain:  Vitals:   09/08/18 0925  TempSrc:   PainSc: 0-No pain                 Martha Clan

## 2018-09-08 NOTE — Anesthesia Post-op Follow-up Note (Signed)
Anesthesia QCDR form completed.        

## 2018-09-08 NOTE — Op Note (Signed)
[facility] Gastroenterology Patient Name: Lynn Wilson Procedure Date: 09/08/2018 7:44 AM MRN: [id number] Account #: [id number] Date of Birth: [birth year] Admit Type: Outpatient Age: 37 Room: [facility] ENDO ROOM 3 Gender: Female Note Status: Finalized Procedure:            Colonoscopy Indications:          Rectal bleeding Providers:            Lin Landsman MD, MD Medicines:            Monitored Anesthesia Care Complications:        No immediate complications. Estimated blood loss: None. Procedure:            Pre-Anesthesia Assessment:                       - Prior to the procedure, a History and Physical was                        performed, and patient medications and allergies were                        reviewed. The patient is competent. The risks and                        benefits of the procedure and the sedation options and                        risks were discussed with the patient. All questions                        were answered and informed consent was obtained.                        Patient identification and proposed procedure were                        verified by the physician, the nurse, the                        anesthesiologist, the anesthetist and the technician in                        the pre-procedure area in the procedure room in the                        endoscopy suite. Mental Status Examination: alert and                        oriented. Airway Examination: normal oropharyngeal                        airway and neck mobility. Respiratory Examination:                        clear to auscultation. CV Examination: normal.                        Prophylactic Antibiotics: The patient does not require  prophylactic antibiotics. Prior Anticoagulants: The                        patient has taken no previous anticoagulant or                        antiplatelet agents. ASA Grade Assessment: II - A                         patient with mild systemic disease. After reviewing the                        risks and benefits, the patient was deemed in                        satisfactory condition to undergo the procedure. The                        anesthesia plan was to use monitored anesthesia care                        (MAC). Immediately prior to administration of                        medications, the patient was re-assessed for adequacy                        to receive sedatives. The heart rate, respiratory rate,                        oxygen saturations, blood pressure, adequacy of                        pulmonary ventilation, and response to care were                        monitored throughout the procedure. The physical status                        of the patient was re-assessed after the procedure.                       After obtaining informed consent, the colonoscope was                        passed under direct vision. Throughout the procedure,                        the patient's blood pressure, pulse, and oxygen                        saturations were monitored continuously. The                        Colonoscope was introduced through the anus and                        advanced to the the terminal ileum. The colonoscopy was  performed without difficulty. The patient tolerated the                        procedure well. The quality of the bowel preparation                        was evaluated using the BBPS [facility] Bowel Preparation                        Scale) with scores of: Right Colon = 3, Transverse                        Colon = 3 and Left Colon = 3 (entire mucosa seen well                        with no residual staining, small fragments of stool or                        opaque liquid). The total BBPS score equals 9. Findings:      The perianal and digital rectal examinations were normal. Pertinent       negatives include normal sphincter tone and no  palpable rectal lesions.      The terminal ileum appeared normal.      Multiple diverticula were found in the sigmoid colon and descending       colon. There was no evidence of diverticular bleeding.      External and internal hemorrhoids were found during retroflexion. The       hemorrhoids were medium-sized.      Normal mucosa was found in the entire colon. Impression:           - The examined portion of the ileum was normal.                       - Severe diverticulosis in the sigmoid colon and in the                        descending colon. There was no evidence of diverticular                        bleeding.                       - External and internal hemorrhoids.                       - Normal mucosa in the entire examined colon.                       - No specimens collected. Recommendation:       - Discharge patient to home (with spouse).                       - Resume previous diet today.                       - Continue present medications.                       - Repeat colonoscopy at age 65 for surveillance.                       -  Return to my office in 2 weeks for hemorrhoid banding. Procedure Code(s):    --- Professional ---                       [phone], Colonoscopy, flexible; diagnostic, including                        collection of specimen(s) by brushing or washing, when                        performed (separate procedure) Diagnosis Code(s):    --- Professional ---                       K64.8, Other hemorrhoids                       K62.5, Hemorrhage of anus and rectum                       K57.30, Diverticulosis of large intestine without                        perforation or abscess without bleeding CPT copyright 2017 American Medical Association. All rights reserved. The codes documented in this report are preliminary and upon coder review may  be revised to meet current compliance requirements. Dr. Ulyess Mort Lin Landsman MD, MD 09/08/2018 8:54:15  AM This report has been signed electronically. Number of Addenda: 0 Note Initiated On: 09/08/2018 7:44 AM Scope Withdrawal Time: 0 hours 8 minutes 14 seconds  Total Procedure Duration: 0 hours 10 minutes 39 seconds       [facility]

## 2018-09-08 NOTE — Op Note (Signed)
[facility] Gastroenterology Patient Name: Lynn Wilson Procedure Date: 09/08/2018 7:43 AM MRN: [id number] Account #: [id number] Date of Birth: 1981-08-30 Admit Type: Outpatient Age: 37 Room: [facility] ENDO ROOM 3 Gender: Female Note Status: Finalized Procedure:            Upper GI endoscopy Indications:          Abdominal pain in the left lower quadrant Providers:            Lin Landsman MD, MD Medicines:            Monitored Anesthesia Care Complications:        No immediate complications. Estimated blood loss: None. Procedure:            Pre-Anesthesia Assessment:                       - Prior to the procedure, a History and Physical was                        performed, and patient medications and allergies were                        reviewed. The patient is competent. The risks and                        benefits of the procedure and the sedation options and                        risks were discussed with the patient. All questions                        were answered and informed consent was obtained.                        Patient identification and proposed procedure were                        verified by the physician, the nurse, the                        anesthesiologist, the anesthetist and the technician in                        the pre-procedure area in the procedure room in the                        endoscopy suite. Mental Status Examination: alert and                        oriented. Airway Examination: normal oropharyngeal                        airway and neck mobility. Respiratory Examination:                        clear to auscultation. CV Examination: normal.                        Prophylactic Antibiotics: The patient does not require  prophylactic antibiotics. Prior Anticoagulants: The                        patient has taken no previous anticoagulant or                        antiplatelet agents. ASA Grade  Assessment: II - A                        patient with mild systemic disease. After reviewing the                        risks and benefits, the patient was deemed in                        satisfactory condition to undergo the procedure. The                        anesthesia plan was to use monitored anesthesia care                        (MAC). Immediately prior to administration of                        medications, the patient was re-assessed for adequacy                        to receive sedatives. The heart rate, respiratory rate,                        oxygen saturations, blood pressure, adequacy of                        pulmonary ventilation, and response to care were                        monitored throughout the procedure. The physical status                        of the patient was re-assessed after the procedure.                       After obtaining informed consent, the endoscope was                        passed under direct vision. Throughout the procedure,                        the patient's blood pressure, pulse, and oxygen                        saturations were monitored continuously. The Endoscope                        was introduced through the mouth, and advanced to the                        second part of duodenum. The upper GI endoscopy was  accomplished without difficulty. The patient tolerated                        the procedure fairly well. Findings:      The duodenal bulb and second portion of the duodenum were normal.      Diffuse moderately erythematous mucosa without bleeding was found in the       gastric antrum.      The gastric fundus, gastric body and incisura were normal. Biopsies were       taken with a cold forceps for Helicobacter pylori testing.      Esophagogastric landmarks were identified: the gastroesophageal junction       was found at 38 cm from the incisors.      The gastroesophageal junction and examined  esophagus were normal. Impression:           - Normal duodenal bulb and second portion of the                        duodenum.                       - Erythematous mucosa in the antrum.                       - Normal gastric fundus, gastric body and incisura.                        Biopsied.                       - Esophagogastric landmarks identified.                       - Normal gastroesophageal junction and esophagus. Recommendation:       - Await pathology results.                       - Proceed with colonoscopy as scheduled                       See colonoscopy report                       - Continue present medications. Procedure Code(s):    --- Professional ---                       [phone], Esophagogastroduodenoscopy, flexible, transoral;                        with biopsy, single or multiple Diagnosis Code(s):    --- Professional ---                       K31.89, Other diseases of stomach and duodenum                       R10.32, Left lower quadrant pain CPT copyright 2017 American Medical Association. All rights reserved. The codes documented in this report are preliminary and upon coder review may  be revised to meet current compliance requirements. Dr. Ulyess Mort Lin Landsman MD, MD 09/08/2018 8:35:50 AM This report has been signed electronically. Number of Addenda: 0 Note Initiated On: 09/08/2018 7:43 AM  [facility]

## 2018-09-08 NOTE — H&P (Signed)
Cephas Darby, MD [address]  [place]  [place], [place] [address]  Main: [phone]  Fax: [phone] Pager: [phone]  Primary Care Physician:  Steele Sizer, MD Primary Gastroenterologist:  Dr. Cephas Darby  Pre-Procedure History & Physical: HPI:  Lynn Wilson is a 37 y.o. female is here for an endoscopy and colonoscopy.   Past Medical History:  Diagnosis Date  . GERD (gastroesophageal reflux disease)   . Hypertension   . Hypothyroid   . Hypothyroid 01/25/2018    History reviewed. No pertinent surgical history.  Prior to Admission medications   Medication Sig Start Date End Date Taking? Authorizing Provider  amLODipine (NORVASC) 5 MG tablet Take 1 tablet (5 mg total) by mouth daily. 07/11/18  Yes Sowles, Drue Stager, MD  baclofen (LIORESAL) 20 MG tablet Take 1 tablet (20 mg total) by mouth 3 (three) times daily. Prn migraine 07/11/18  Yes Sowles, Drue Stager, MD  cholecalciferol (VITAMIN D) 1000 units tablet Take 2,000 Units by mouth daily.   Yes [provider]  dicyclomine (BENTYL) 20 MG tablet Take 1 tablet (20 mg total) by mouth 4 (four) times daily -  before meals and at bedtime. 07/11/18  Yes Sowles, Drue Stager, MD  hydrochlorothiazide (HYDRODIURIL) 12.5 MG tablet Take 1 tablet (12.5 mg total) by mouth daily. 07/11/18  Yes Sowles, Drue Stager, MD  levothyroxine (SYNTHROID, LEVOTHROID) 100 MCG tablet Take 1 tablet (100 mcg total) by mouth every morning. 07/11/18  Yes Sowles, Drue Stager, MD  Cetirizine HCl 10 MG CAPS Take 1 capsule (10 mg total) by mouth daily. Patient not taking: Reported on 09/08/2018 03/27/18   Sherrie George B, FNP  medroxyPROGESTERone (DEPO-PROVERA) 150 MG/ML injection Inject 1 mL (150 mg total) into the muscle every 3 (three) months. Patient not taking: Reported on 07/29/2018 05/10/18   Steele Sizer, MD  pantoprazole (PROTONIX) 40 MG tablet Take 1 tablet (40 mg total) by mouth daily. 07/11/18   Steele Sizer, MD  SUMAtriptan (IMITREX) 100  MG tablet Take 1 tablet (100 mg total) by mouth every 2 (two) hours as needed for migraine. May repeat in 2 hours if headache persists or recurs. Patient not taking: Reported on 09/08/2018 07/11/18   Steele Sizer, MD    Allergies as of 08/12/2018 - Review Complete 08/12/2018  Allergen Reaction Noted  . Celebrex [celecoxib]  03/08/2012  . Sesame seed (diagnostic) Itching 01/25/2018  . Lisinopril Rash 03/08/2012    Family History  Problem Relation Age of Onset  . Hypertension Mother   . Hypertension Father   . Stroke Father     Social History   Socioeconomic History  . Marital status: Married    Spouse name: Windy Canny  . Number of children: 0  . Years of education: Not on file  . Highest education level: Associate degree: occupational, Hotel manager, or vocational program  Occupational History  . Occupation: Chiropractor: [employer]  . Financial resource strain: Somewhat hard  . Food insecurity:    Worry: Sometimes true    Inability: Sometimes true  . Transportation needs:    Medical: No    Non-medical: No  Tobacco Use  . Smoking status: Current Every Day Smoker    Packs/day: 0.50    Years: 18.00    Pack years: 9.00    Types: Cigarettes    Start date: 01/26/2000  . Smokeless tobacco: Never Used  . Tobacco comment: contemplating   Substance and Sexual Activity  . Alcohol use: Yes  Alcohol/week: 2.0 standard drinks    Types: 2 Cans of beer per week  . Drug use: No  . Sexual activity: Yes    Birth control/protection: Other-see comments    Comment: homosexual   Lifestyle  . Physical activity:    Days per week: 0 days    Minutes per session: 0 min  . Stress: Only a little  Relationships  . Social connections:    Talks on phone: More than three times a week    Gets together: Once a week    Attends religious service: More than 4 times per year    Active member of club or organization: No    Attends meetings of clubs or organizations: Never     Relationship status: Living with partner  . Intimate partner violence:    Fear of current or ex partner: No    Emotionally abused: No    Physically abused: No    Forced sexual activity: No  Other Topics Concern  . Not on file  Social History Narrative   She moved to [place] from [place], [place] in the Summer of 2018   She lives with her girlfriend - homosexual - together for the past 3 years   She does not have any children, but girlfriend has an adult daughter that lives with them.    She is now working at [employer]: See HPI, otherwise negative ROS  Physical Exam: BP (!) 143/95   Pulse 98   Temp (!) 97.3 F (36.3 C) (Tympanic)   Resp 16   Ht 5\' 4"  (1.626 m)   Wt 106.1 kg   LMP 08/16/2018   SpO2 100%   BMI 40.17 kg/m  General:   Alert,  pleasant and cooperative in NAD Head:  Normocephalic and atraumatic. Neck:  Supple; no masses or thyromegaly. Lungs:  Clear throughout to auscultation.    Heart:  Regular rate and rhythm. Abdomen:  Soft, nontender and nondistended. Normal bowel sounds, without guarding, and without rebound.   Neurologic:  Alert and  oriented x4;  grossly normal neurologically.  Impression/Plan: Lynn Wilson is here for an endoscopy and colonoscopy to be performed for rectal bleeding, IDA, LUQ pain  Risks, benefits, limitations, and alternatives regarding  endoscopy and colonoscopy have been reviewed with the patient.  Questions have been answered.  All parties agreeable.   Sherri Sear, MD  09/08/2018, 8:20 AM

## 2018-09-08 NOTE — Transfer of Care (Signed)
Immediate Anesthesia Transfer of Care Note  Patient: Lynn Wilson  Procedure(s) Performed: ESOPHAGOGASTRODUODENOSCOPY (EGD) WITH PROPOFOL (N/A ) COLONOSCOPY WITH PROPOFOL (N/A )  Patient Location: PACU and Endoscopy Unit  Anesthesia Type:General  Level of Consciousness: sedated  Airway & Oxygen Therapy: Patient Spontanous Breathing and Patient connected to nasal cannula oxygen  Post-op Assessment: Report given to RN and Post -op Vital signs reviewed and stable  Post vital signs: Reviewed and stable  Last Vitals:  Vitals Value Taken Time  BP    Temp    Pulse 102 09/08/2018  8:55 AM  Resp 25 09/08/2018  8:55 AM  SpO2 97 % 09/08/2018  8:55 AM  Vitals shown include unvalidated device data.  Last Pain:  Vitals:   09/08/18 0744  TempSrc: Tympanic         Complications: No apparent anesthesia complications

## 2018-09-08 NOTE — Anesthesia Preprocedure Evaluation (Signed)
Anesthesia Evaluation  Patient identified by MRN, date of birth, ID band Patient awake    Reviewed: Allergy & Precautions, H&P , NPO status , Patient's Chart, lab work & pertinent test results, reviewed documented beta blocker date and time   History of Anesthesia Complications Negative for: history of anesthetic complications  Airway Mallampati: III  TM Distance: >3 FB Neck ROM: full    Dental  (+) Dental Advidsory Given, Chipped, Teeth Intact   Pulmonary neg shortness of breath, neg sleep apnea, neg COPD, neg recent URI, Current Smoker,           Cardiovascular Exercise Tolerance: Good hypertension, (-) angina(-) CAD, (-) Past MI, (-) Cardiac Stents and (-) CABG (-) dysrhythmias (-) Valvular Problems/Murmurs     Neuro/Psych  Headaches, neg Seizures negative psych ROS   GI/Hepatic Neg liver ROS, GERD  ,  Endo/Other  neg diabetesHypothyroidism Morbid obesity  Renal/GU negative Renal ROS  negative genitourinary   Musculoskeletal   Abdominal   Peds  Hematology negative hematology ROS (+)   Anesthesia Other Findings Past Medical History: No date: GERD (gastroesophageal reflux disease) No date: Hypertension No date: Hypothyroid 01/25/2018: Hypothyroid   Reproductive/Obstetrics negative OB ROS                             Anesthesia Physical Anesthesia Plan  ASA: III  Anesthesia Plan: General   Post-op Pain Management:    Induction: Intravenous  PONV Risk Score and Plan: 2 and Propofol infusion and TIVA  Airway Management Planned: Natural Airway and Nasal Cannula  Additional Equipment:   Intra-op Plan:   Post-operative Plan:   Informed Consent: I have reviewed the patients History and Physical, chart, labs and discussed the procedure including the risks, benefits and alternatives for the proposed anesthesia with the patient or authorized representative who has indicated his/her  understanding and acceptance.   Dental Advisory Given  Plan Discussed with: Anesthesiologist, CRNA and Surgeon  Anesthesia Plan Comments:         Anesthesia Quick Evaluation

## 2018-09-12 ENCOUNTER — Encounter: Payer: Self-pay | Admitting: Gastroenterology

## 2018-09-12 LAB — SURGICAL PATHOLOGY

## 2018-09-13 ENCOUNTER — Other Ambulatory Visit: Payer: Self-pay

## 2018-09-13 DIAGNOSIS — D500 Iron deficiency anemia secondary to blood loss (chronic): Secondary | ICD-10-CM

## 2018-09-15 ENCOUNTER — Ambulatory Visit
Admission: RE | Admit: 2018-09-15 | Discharge: 2018-09-15 | Disposition: A | Discharge status: Home / Self Care | Payer: PRIVATE HEALTH INSURANCE | Source: Ambulatory Visit | Attending: Family Medicine | Admitting: Family Medicine

## 2018-09-15 ENCOUNTER — Other Ambulatory Visit: Payer: Self-pay | Admitting: Family Medicine

## 2018-09-15 DIAGNOSIS — M25521 Pain in right elbow: Secondary | ICD-10-CM | POA: Diagnosis not present

## 2018-09-15 DIAGNOSIS — R52 Pain, unspecified: Secondary | ICD-10-CM

## 2018-09-15 LAB — H. PYLORI ANTIBODY, IGG: H. pylori, IgG AbS: 0.3 Index Value (ref 0.00–0.79)

## 2018-09-19 ENCOUNTER — Other Ambulatory Visit: Payer: Self-pay

## 2018-09-19 ENCOUNTER — Emergency Department: Payer: PRIVATE HEALTH INSURANCE

## 2018-09-19 ENCOUNTER — Encounter: Payer: Self-pay | Admitting: Emergency Medicine

## 2018-09-19 ENCOUNTER — Emergency Department
Admission: EM | Admit: 2018-09-19 | Discharge: 2018-09-19 | Disposition: A | Discharge status: Home / Self Care | Payer: PRIVATE HEALTH INSURANCE | Attending: Emergency Medicine | Admitting: Emergency Medicine

## 2018-09-19 ENCOUNTER — Telehealth: Payer: Self-pay | Admitting: Gastroenterology

## 2018-09-19 DIAGNOSIS — S0081XA Abrasion of other part of head, initial encounter: Secondary | ICD-10-CM | POA: Diagnosis present

## 2018-09-19 DIAGNOSIS — S0083XA Contusion of other part of head, initial encounter: Secondary | ICD-10-CM | POA: Diagnosis not present

## 2018-09-19 DIAGNOSIS — W228XXA Striking against or struck by other objects, initial encounter: Secondary | ICD-10-CM | POA: Diagnosis not present

## 2018-09-19 DIAGNOSIS — Z79899 Other long term (current) drug therapy: Secondary | ICD-10-CM | POA: Insufficient documentation

## 2018-09-19 DIAGNOSIS — Y9389 Activity, other specified: Secondary | ICD-10-CM | POA: Insufficient documentation

## 2018-09-19 DIAGNOSIS — Y998 Other external cause status: Secondary | ICD-10-CM | POA: Insufficient documentation

## 2018-09-19 DIAGNOSIS — M542 Cervicalgia: Secondary | ICD-10-CM | POA: Insufficient documentation

## 2018-09-19 DIAGNOSIS — F17210 Nicotine dependence, cigarettes, uncomplicated: Secondary | ICD-10-CM | POA: Diagnosis not present

## 2018-09-19 DIAGNOSIS — Y929 Unspecified place or not applicable: Secondary | ICD-10-CM | POA: Insufficient documentation

## 2018-09-19 DIAGNOSIS — E039 Hypothyroidism, unspecified: Secondary | ICD-10-CM | POA: Insufficient documentation

## 2018-09-19 DIAGNOSIS — I10 Essential (primary) hypertension: Secondary | ICD-10-CM | POA: Insufficient documentation

## 2018-09-19 MED ORDER — KETOROLAC TROMETHAMINE 10 MG PO TABS: 10.0000 mg | ORAL_TABLET | Freq: Four times a day (QID) | ORAL | 0 refills | Status: AC | PRN

## 2018-09-19 MED ORDER — KETOROLAC TROMETHAMINE 30 MG/ML IJ SOLN
30.0000 mg | Freq: Once | INTRAMUSCULAR | Status: AC
  Administered 2018-09-19: 30 mg via INTRAMUSCULAR
  Filled 2018-09-19: qty 1

## 2018-09-19 NOTE — ED Notes (Signed)
See triage note  States she hit her head on a linen door while reaching for a pillow  Small abrasion noted to forehead   Having some slight neck discomfort

## 2018-09-19 NOTE — ED Triage Notes (Signed)
Abrasion forehead. Was working here, hit head on something that stuck out of closet door. Denies LOC. Small abrasion forehead. Denies use of blood thinners.

## 2018-09-19 NOTE — Telephone Encounter (Signed)
Patient called and would like her lab results ordered by Dr Marius Ditch.

## 2018-09-19 NOTE — ED Provider Notes (Signed)
[facility] Emergency Department Provider Note  ____________________________________________  Time seen: Approximately 7:56 PM  I have reviewed the triage vital signs and the nursing notes.   HISTORY  Chief Complaint Abrasion    HPI Lynn Wilson is a 37 y.o. female presents to the emergency department with a very small abrasion along midline forehead after patient hit her head on a linen closet while reaching for a forehead.  Patient did not lose consciousness during incident.  She is complaining of 7 out of 10 frontal headache with no changes in vision, nausea or vomiting.  She is not currently taking blood thinners.  She is also complaining of associated neck pain.  No numbness or tingling in the upper or lower extremities.  Her tetanus status is up-to-date.   Past Medical History:  Diagnosis Date  . GERD (gastroesophageal reflux disease)   . Hypertension   . Hypothyroid   . Hypothyroid 01/25/2018    Patient Active Problem List   Diagnosis Date Noted  . LUQ pain   . Rectal bleeding   . Menorrhagia with regular cycle 05/10/2018  . Internal bleeding hemorrhoids 05/10/2018  . Migraine without aura and responsive to treatment 02/16/2018  . Iron deficiency anemia due to chronic blood loss 02/16/2018  . Hyperglycemia 01/29/2018  . Metabolic syndrome 51/01/5851  . Hypertension 01/25/2018  . GERD (gastroesophageal reflux disease) 01/25/2018  . Hypothyroid 01/25/2018  . Morbid obesity ([place]) 01/25/2018    Past Surgical History:  Procedure Laterality Date  . COLONOSCOPY WITH PROPOFOL N/A 09/08/2018   Procedure: COLONOSCOPY WITH PROPOFOL;  Surgeon: Lin Landsman, MD;  Location: [facility] ENDOSCOPY;  Service: Gastroenterology;  Laterality: N/A;  . ESOPHAGOGASTRODUODENOSCOPY (EGD) WITH PROPOFOL N/A 09/08/2018   Procedure: ESOPHAGOGASTRODUODENOSCOPY (EGD) WITH PROPOFOL;  Surgeon: Lin Landsman, MD;  Location: [facility] ENDOSCOPY;  Service:  Gastroenterology;  Laterality: N/A;    Prior to Admission medications   Medication Sig Start Date End Date Taking? Authorizing Provider  amLODipine (NORVASC) 5 MG tablet Take 1 tablet (5 mg total) by mouth daily. 07/11/18   Steele Sizer, MD  baclofen (LIORESAL) 20 MG tablet Take 1 tablet (20 mg total) by mouth 3 (three) times daily. Prn migraine 07/11/18   Steele Sizer, MD  Cetirizine HCl 10 MG CAPS Take 1 capsule (10 mg total) by mouth daily. Patient not taking: Reported on 09/08/2018 03/27/18   Sherrie George B, FNP  cholecalciferol (VITAMIN D) 1000 units tablet Take 2,000 Units by mouth daily.    [provider]  dicyclomine (BENTYL) 20 MG tablet Take 1 tablet (20 mg total) by mouth 4 (four) times daily -  before meals and at bedtime. 07/11/18   Steele Sizer, MD  hydrochlorothiazide (HYDRODIURIL) 12.5 MG tablet Take 1 tablet (12.5 mg total) by mouth daily. 07/11/18   Steele Sizer, MD  ketorolac (TORADOL) 10 MG tablet Take 1 tablet (10 mg total) by mouth every 6 (six) hours as needed for up to 5 days. 09/19/18 09/24/18  Lannie Fields, PA-C  levothyroxine (SYNTHROID, LEVOTHROID) 100 MCG tablet Take 1 tablet (100 mcg total) by mouth every morning. 07/11/18   Steele Sizer, MD  medroxyPROGESTERone (DEPO-PROVERA) 150 MG/ML injection Inject 1 mL (150 mg total) into the muscle every 3 (three) months. Patient not taking: Reported on 07/29/2018 05/10/18   Steele Sizer, MD  pantoprazole (PROTONIX) 40 MG tablet Take 1 tablet (40 mg total) by mouth daily. 07/11/18   Steele Sizer, MD  SUMAtriptan (IMITREX) 100 MG tablet Take 1 tablet (100  mg total) by mouth every 2 (two) hours as needed for migraine. May repeat in 2 hours if headache persists or recurs. Patient not taking: Reported on 09/08/2018 07/11/18   Steele Sizer, MD    Allergies Celebrex [celecoxib]; Sesame seed (diagnostic); and Lisinopril  Family History  Problem Relation Age of Onset  . Hypertension Mother   .  Hypertension Father   . Stroke Father     Social History Social History   Tobacco Use  . Smoking status: Current Every Day Smoker    Packs/day: 0.50    Years: 18.00    Pack years: 9.00    Types: Cigarettes    Start date: 01/26/2000  . Smokeless tobacco: Never Used  . Tobacco comment: contemplating   Substance Use Topics  . Alcohol use: Yes    Alcohol/week: 2.0 standard drinks    Types: 2 Cans of beer per week  . Drug use: No     Review of Systems  Constitutional: No fever/chills Eyes: No visual changes. No discharge ENT: No upper respiratory complaints. Cardiovascular: no chest pain. Respiratory: no cough. No SOB. Gastrointestinal: No abdominal pain.  No nausea, no vomiting.  No diarrhea.  No constipation. Genitourinary: Negative for dysuria. No hematuria. Musculoskeletal: Negative for musculoskeletal pain. Skin: Patient has abrasion of midline forehead. Neurological: Negative for headaches, focal weakness or numbness.   ____________________________________________   PHYSICAL EXAM:  VITAL SIGNS: ED Triage Vitals  Enc Vitals Group     BP 09/19/18 1757 138/88     Pulse Rate 09/19/18 1757 62     Resp 09/19/18 1757 20     Temp 09/19/18 1757 97.8 F (36.6 C)     Temp Source 09/19/18 1757 Oral     SpO2 09/19/18 1757 99 %     Weight 09/19/18 1758 237 lb (107.5 kg)     Height 09/19/18 1758 5\' 4"  (1.626 m)     Head Circumference --      Peak Flow --      Pain Score 09/19/18 1758 6     Pain Loc --      Pain Edu? --      Excl. in [place]? --      Constitutional: Alert and oriented. Well appearing and in no acute distress. Eyes: Conjunctivae are normal. PERRL. EOMI. Head: Atraumatic. ENT:      Ears: TMs are pearly.      Nose: No congestion/rhinnorhea.      Mouth/Throat: Mucous membranes are moist.  Neck: No stridor.  No cervical spine tenderness to palpation. Cardiovascular: Normal rate, regular rhythm. Normal S1 and S2.  Good peripheral circulation. Respiratory:  Normal respiratory effort without tachypnea or retractions. Lungs CTAB. Good air entry to the bases with no decreased or absent breath sounds. Musculoskeletal: Full range of motion to all extremities. No gross deformities appreciated. Neurologic:  Normal speech and language. No gross focal neurologic deficits are appreciated.  Skin:  Skin is warm, dry and intact. No rash noted. Psychiatric: Mood and affect are normal. Speech and behavior are normal. Patient exhibits appropriate insight and judgement.   ____________________________________________   LABS (all labs ordered are listed, but only abnormal results are displayed)  Labs Reviewed - No data to display ____________________________________________  EKG   ____________________________________________  RADIOLOGY   Dg Cervical Spine 2-3 Views  Result Date: 09/19/2018 CLINICAL DATA:  Abrasion forehead. Was working here, hit head on something that stuck out of closet door. Denies LOC. Small abrasion forehead EXAM: CERVICAL SPINE - 2-3 VIEW  COMPARISON:  None. FINDINGS: There is no evidence of cervical spine fracture or prevertebral soft tissue swelling. Alignment is normal. No other significant bone abnormalities are identified. IMPRESSION: Negative cervical spine radiographs. Electronically Signed   By: Lajean Manes M.D.   On: 09/19/2018 20:38    ____________________________________________    PROCEDURES  Procedure(s) performed:    Procedures    Medications  ketorolac (TORADOL) 30 MG/ML injection 30 mg (30 mg Intramuscular Given 09/19/18 2001)     ____________________________________________   INITIAL IMPRESSION / ASSESSMENT AND PLAN / ED COURSE  Pertinent labs & imaging results that were available during my care of the patient were reviewed by me and considered in my medical decision making (see chart for details).  Review of the [place] CSRS was performed in accordance of the [place] prior to dispensing any controlled  drugs.      Assessment and plan:  Neck pain Abrasion Patient presents to the emergency department with a small superficial abrasion of midline forehead and neck pain after patient accidentally hit her head against a linen closet while reaching for a pillow.  X-ray examination of the cervical spine revealed no acute abnormality.  Patient was given an injection of Toradol in the emergency department due to pain and was discharged with Toradol.  A work note was provided as requested.  All patient questions were answered.    ____________________________________________  FINAL CLINICAL IMPRESSION(S) / ED DIAGNOSES  Final diagnoses:  Contusion of other part of head, initial encounter  Neck pain      NEW MEDICATIONS STARTED DURING THIS VISIT:  ED Discharge Orders         Ordered    ketorolac (TORADOL) 10 MG tablet  Every 6 hours PRN     09/19/18 2057              This chart was dictated using voice recognition software/Dragon. Despite best efforts to proofread, errors can occur which can change the meaning. Any change was purely unintentional.    Lannie Fields, PA-C 09/19/18 2359    Carrie Mew, MD 09/22/18 [id number]

## 2018-09-20 ENCOUNTER — Other Ambulatory Visit: Payer: Self-pay

## 2018-09-20 MED ORDER — AMITRIPTYLINE HCL 25 MG PO TABS: 25.0000 mg | ORAL_TABLET | Freq: Every day | ORAL | 0 refills | Status: DC

## 2018-09-20 NOTE — Telephone Encounter (Signed)
Spoke with patient and confirmed H Pylori results were negative, pt is still complaining of abdominal pain very bad, but she is having bowel movements, please advise

## 2018-09-20 NOTE — Telephone Encounter (Signed)
Medication has been sent to pharmacy, and pt will schedule follow up for banding

## 2018-10-14 ENCOUNTER — Ambulatory Visit (INDEPENDENT_AMBULATORY_CARE_PROVIDER_SITE_OTHER): Payer: BLUE CROSS/BLUE SHIELD | Admitting: Gastroenterology

## 2018-10-14 ENCOUNTER — Other Ambulatory Visit: Payer: Self-pay

## 2018-10-14 ENCOUNTER — Encounter: Payer: Self-pay | Admitting: Gastroenterology

## 2018-10-14 VITALS — BP 131/89 | HR 80 | Resp 17 | Ht 64.0 in | Wt 239.6 lb

## 2018-10-14 DIAGNOSIS — G8929 Other chronic pain: Secondary | ICD-10-CM | POA: Diagnosis not present

## 2018-10-14 DIAGNOSIS — K640 First degree hemorrhoids: Secondary | ICD-10-CM | POA: Diagnosis not present

## 2018-10-14 DIAGNOSIS — K625 Hemorrhage of anus and rectum: Secondary | ICD-10-CM

## 2018-10-14 DIAGNOSIS — R1032 Left lower quadrant pain: Secondary | ICD-10-CM | POA: Diagnosis not present

## 2018-10-14 MED ORDER — AMITRIPTYLINE HCL 25 MG PO TABS: 25.0000 mg | ORAL_TABLET | Freq: Every day | ORAL | 0 refills | Status: DC

## 2018-10-14 NOTE — Progress Notes (Signed)

## 2018-10-14 NOTE — Progress Notes (Signed)
Cephas Darby, MD [address]  [place]  [place], [place] [address]  Main: [phone]  Fax: [phone]    Gastroenterology Consultation  Referring Provider:     Steele Sizer, MD Primary Care Physician:  Steele Sizer, MD Primary Gastroenterologist:  Dr. Cephas Darby Reason for Consultation:     Iron deficiency anemia, rectal bleeding        HPI:   Lynn Wilson is a 37 y.o. African-American female  With history of morbid obesity, hypothyroidism, and hypertension referred by Dr. Steele Sizer, MD  for consultation & management of severe iron deficiency anemia, Chronic rectal bleeding. She was in the emergency room last week secondary to left-sided abdominal pain, nausea, vomiting, diarrhea, mild rectal bleeding and underwent CT which was unremarkable other than sigmoid diverticulosis. Apparently, patient has chronic history of intermittent episodes of rectal bleeding, loose stool with dark red blood clots, left lower quadrant pain, every other month. She underwent upper endoscopy and colonoscopy in [place] in 2015 and was found to have sigmoid diverticulosis, internal hemorrhoids, hiatal hernia. She has developed iron deficiency anemia since 04/2017. Her lowest hemoglobin was 9.9, most recently 10.4, MCV 66.2, ferritin 8 in 01/2018. She saw Dr. Ancil Boozer last week who started her on oral iron supplementation, she is currently taking 1 pill a day. And, she is also taking Protonix once a day. Patient has been taking Excedrin for chronic migraine headaches for last 3-4 years, about 3-4 pills daily. She just recently started on Topamax for migraine headaches by Dr. Ancil Boozer. she had history of elevated transaminases AST greater than AST in 04/2017 which are now back to normal. This was probably in the setting of alcohol use or symptomatic cholelithiasis. She reports feeling tired, fatigued secondary to anemia. She denies active rectal bleeding, abdominal pain, nausea, vomiting,  diarrhea  Follow-up visit 03/25/2018 She saw Dr.Yu at [place] for parenteral iron therapy due to severe iron deficiency. She received 4 infusions so far. She stopped taking Excedrin.Patient reports that her energy levels are significantly better and not experiencing headaches. She is currently constipated. She is taking oral iron 325 mg daily. She liked fusion plus iron supplements and did not cause constipation and requesting more samples. She also lost about 5 pounds in last 4 weeks cutting back on high carb diet. She is not eating during middle of the night and nothing after 7 PM. She denies any other GI symptoms. She denies loss of appetite, fever, chills, nausea, vomiting. She is taking Protonix 2 times daily  Follow up visit 08/12/2018 She went to ER for 2 days history of left upper quadrant pain associated with nausea, rectal bleeding. She underwent CT A/P which revealed left-sided diverticulosis only. She did not have leukocytosis. Lipase, LFTs were normal. She notices small clots associated with bleeding per rectum intermittently.She does report intermittent constipation.she continues to take oral iron daily. She is taking Bentyl as needed for cramps  Follow-up visit 10/14/2018 Patient reports that her left-sided abdominal pain is improving on amitriptyline 20 mg at bedtime.  She also started taking fusion plus which is helping to move her bowels regularly.  She is here to discuss about hemorrhoid ligation.  She underwent EGD and colonoscopy and was found to have large hemorrhoids.  There was no evidence of H. pylori infection on gastric biopsies.  NSAIDs: currently stopped, Excedrin, 3-4 pills daily for chronic headaches for last 3-4 years  Antiplts/Anticoagulants/Anti thrombotics: none  GI Procedures: EGD and colonoscopy  09/08/2018 -  Normal duodenal bulb and second portion of the duodenum. - Erythematous mucosa in the antrum. - Normal gastric fundus, gastric body and incisura.  Biopsied. - Esophagogastric landmarks identified. - Normal gastroesophageal junction and esophagus.  - The examined portion of the ileum was normal. - Severe diverticulosis in the sigmoid colon and in the descending colon. There was no evidence of diverticular Bleeding. - External and internal hemorrhoids. - Normal mucosa in the entire examined colon. - No specimens collected.   DIAGNOSIS:  A. 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  - IHC FOR HELICOBACTER IS NEGATIVE.  - NEGATIVE FOR DYSPLASIA AND MALIGNANCY.  Repeat EGD and colonoscopy in [place], in 2015 was told that she has hiatal hernia, diverticulosis, internal hemorrhoids Report not available Pathology from gastric biopsies was negative for H. Pylori gastritis. She denies family history of GI malignancy She denies abdominal surgeries She smokes cigarettes, drinks about 4 beers per week She denies IV drug abuse  Past Medical History:  Diagnosis Date  . GERD (gastroesophageal reflux disease)   . Hypertension   . Hypothyroid   . Hypothyroid 01/25/2018    Past Surgical History:  Procedure Laterality Date  . COLONOSCOPY WITH PROPOFOL N/A 09/08/2018   Procedure: COLONOSCOPY WITH PROPOFOL;  Surgeon: Lin Landsman, MD;  Location: [facility] ENDOSCOPY;  Service: Gastroenterology;  Laterality: N/A;  . ESOPHAGOGASTRODUODENOSCOPY (EGD) WITH PROPOFOL N/A 09/08/2018   Procedure: ESOPHAGOGASTRODUODENOSCOPY (EGD) WITH PROPOFOL;  Surgeon: Lin Landsman, MD;  Location: [facility] ENDOSCOPY;  Service: Gastroenterology;  Laterality: N/A;     Current Outpatient Medications:  .  amitriptyline (ELAVIL) 25 MG tablet, Take 1 tablet (25 mg total) by mouth at bedtime., Disp: 90 tablet, Rfl: 0 .  amLODipine (NORVASC) 5 MG tablet, Take 1 tablet (5 mg total) by mouth daily., Disp: 90 tablet, Rfl: 0 .  baclofen (LIORESAL) 20 MG tablet, Take  1 tablet (20 mg total) by mouth 3 (three) times daily. Prn migraine, Disp: 30 each, Rfl: 0 .  Cetirizine HCl 10 MG CAPS, Take 1 capsule (10 mg total) by mouth daily., Disp: 30 capsule, Rfl: 0 .  cholecalciferol (VITAMIN D) 1000 units tablet, Take 2,000 Units by mouth daily., Disp: , Rfl:  .  dicyclomine (BENTYL) 20 MG tablet, Take 1 tablet (20 mg total) by mouth 4 (four) times daily -  before meals and at bedtime., Disp: 40 tablet, Rfl: 0 .  hydrochlorothiazide (HYDRODIURIL) 12.5 MG tablet, Take 1 tablet (12.5 mg total) by mouth daily., Disp: 90 tablet, Rfl: 0 .  levothyroxine (SYNTHROID, LEVOTHROID) 100 MCG tablet, Take 1 tablet (100 mcg total) by mouth every morning., Disp: 90 tablet, Rfl: 0 .  pantoprazole (PROTONIX) 40 MG tablet, Take 1 tablet (40 mg total) by mouth daily., Disp: 90 tablet, Rfl: 1 .  medroxyPROGESTERone (DEPO-PROVERA) 150 MG/ML injection, Inject 1 mL (150 mg total) into the muscle every 3 (three) months. (Patient not taking: Reported on 07/29/2018), Disp: 1 mL, Rfl: 3 .  SUMAtriptan (IMITREX) 100 MG tablet, Take 1 tablet (100 mg total) by mouth every 2 (two) hours as needed for migraine. May repeat in 2 hours if headache persists or recurs. (Patient not taking: Reported on 09/08/2018), Disp: 10 tablet, Rfl: 0   Family History  Problem Relation Age of Onset  . Hypertension Mother   . Hypertension Father   . Stroke Father      Social History   Tobacco Use  .  Smoking status: Current Every Day Smoker    Packs/day: 0.50    Years: 18.00    Pack years: 9.00    Types: Cigarettes    Start date: 01/26/2000  . Smokeless tobacco: Never Used  . Tobacco comment: contemplating   Substance Use Topics  . Alcohol use: Yes    Alcohol/week: 2.0 standard drinks    Types: 2 Cans of beer per week  . Drug use: No    Allergies as of 10/14/2018 - Review Complete 10/14/2018  Allergen Reaction Noted  . Celebrex [celecoxib]  03/08/2012  . Sesame seed (diagnostic) Itching 01/25/2018  .  Lisinopril Rash 03/08/2012    Review of Systems:    All systems reviewed and negative except where noted in HPI.   Physical Exam:  BP 131/89 (BP Location: Left Arm, Patient Position: Sitting, Cuff Size: Large)   Pulse 80   Resp 17   Ht 5\' 4"  (1.626 m)   Wt 239 lb 9.6 oz (108.7 kg)   BMI 41.13 kg/m  No LMP recorded.  General:   Alert,  Well-developed, well-nourished, pleasant and cooperative in NAD Head:  Normocephalic and atraumatic. Eyes:  Sclera clear, no icterus.   Conjunctiva pink. Ears:  Normal auditory acuity. Nose:  No deformity, discharge, or lesions. Mouth:  No deformity or lesions,oropharynx pink & moist. Neck:  Supple; no masses or thyromegaly. Lungs:  Respirations even and unlabored.  Clear throughout to auscultation.   No wheezes, crackles, or rhonchi. No acute distress. Heart:  Regular rate and rhythm; no murmurs, clicks, rubs, or gallops. Abdomen:  Normal bowel sounds. Soft, obesity,non-tender and non-distended,  No guarding or rebound tenderness.   Rectal: Not performed Msk:  Symmetrical without gross deformities. Good, equal movement & strength bilaterally. Pulses:  Normal pulses noted. Extremities:  No clubbing or edema.  No cyanosis. Neurologic:  Alert and oriented x3;  grossly normal neurologically. Skin:  Intact without significant lesions or rashes. No jaundice. Psych:  Alert and cooperative. Normal mood and affect.  Imaging Studies: Reviewed   Assessment and Plan:   BONI MACLELLAN is a 37 y.o. African-American female with metabolic syndrome presents with chronic history of intermittent episodes of left upper/lower quadrant pain, rectal bleeding since 2015, heavy NSAID use for chronic headaches, found to have severe iron deficiency anemia. She had EGD and colonoscopy in 2015, there was no evidence of peptic ulcer disease. She had a hiatal hernia, sigmoid diverticulosis and internal hemorrhoids. The etiology of iron deficiency anemia is multifactorial  including NSAID-induced enteropathy, NSAID-induced gastritis, recurrent diverticular bleed in the setting of heavy Excedrin use, bleeding internal hemorrhoids. Overall, patient is clinically better.  Left sided abdominal pain: Continue amitriptyline 25 mg at bedtime Continue to avoid NSAIDs Decrease Protonix to 40 mg once a day for about a month then stop  History of iron deficiency anemia, resolved Had persistent iron deficiency EGD and colonoscopy were unremarkable except for large internal and external hemorrhoids H pylori on gastric biopsies negative H. pylori IgG negative Continue oral iron, patient prefers to have fusion plus as it also helps to regulate her bowel movements   Grade 2 internal hemorrhoids with rectal bleeding Discussed with her about hemorrhoid ligation, risks and benefits explained, consent obtained Patient agreeable, perform hemorrhoid ligation today   Follow up in 2 weeks  Cephas Darby, MD

## 2018-10-21 ENCOUNTER — Encounter: Payer: Self-pay | Admitting: Gastroenterology

## 2018-10-21 ENCOUNTER — Other Ambulatory Visit
Admission: RE | Admit: 2018-10-21 | Discharge: 2018-10-21 | Disposition: A | Discharge status: Home / Self Care | Payer: BLUE CROSS/BLUE SHIELD | Source: Ambulatory Visit | Attending: Gastroenterology | Admitting: Gastroenterology

## 2018-10-21 ENCOUNTER — Ambulatory Visit (INDEPENDENT_AMBULATORY_CARE_PROVIDER_SITE_OTHER): Payer: BLUE CROSS/BLUE SHIELD | Admitting: Gastroenterology

## 2018-10-21 VITALS — BP 118/82 | HR 76 | Ht 64.0 in | Wt 235.2 lb

## 2018-10-21 DIAGNOSIS — N3000 Acute cystitis without hematuria: Secondary | ICD-10-CM

## 2018-10-21 DIAGNOSIS — R102 Pelvic and perineal pain: Secondary | ICD-10-CM | POA: Insufficient documentation

## 2018-10-21 DIAGNOSIS — R3915 Urgency of urination: Secondary | ICD-10-CM

## 2018-10-21 LAB — URINALYSIS, ROUTINE W REFLEX MICROSCOPIC
Bacteria, UA: NONE SEEN
Bilirubin Urine: NEGATIVE
Glucose, UA: NEGATIVE mg/dL
Hgb urine dipstick: NEGATIVE
Ketones, ur: NEGATIVE mg/dL
Leukocytes, UA: NEGATIVE
Nitrite: NEGATIVE
Protein, ur: NEGATIVE mg/dL
Specific Gravity, Urine: 1.013 (ref 1.005–1.030)
pH: 6 (ref 5.0–8.0)

## 2018-10-21 MED ORDER — NITROFURANTOIN MACROCRYSTAL 100 MG PO CAPS: 100.0000 mg | ORAL_CAPSULE | Freq: Two times a day (BID) | ORAL | 0 refills | Status: AC

## 2018-10-21 NOTE — Progress Notes (Signed)
Lynn Darby, MD [address]  [place], [place] [address]  Main: [phone]  Fax: [phone]    Gastroenterology Consultation  Referring Provider:     Steele Sizer, MD Primary Care Physician:  Steele Sizer, MD Primary Gastroenterologist:  Dr. Cephas Wilson Reason for Consultation:     Iron deficiency anemia, rectal bleeding        HPI:   Lynn Wilson is a 37 y.o. African-American female  With history of morbid obesity, hypothyroidism, and hypertension referred by Dr. Steele Sizer, MD  for consultation & management of severe iron deficiency anemia, Chronic rectal bleeding. She was in the emergency room last week secondary to left-sided abdominal pain, nausea, vomiting, diarrhea, mild rectal bleeding and underwent CT which was unremarkable other than sigmoid diverticulosis. Apparently, patient has chronic history of intermittent episodes of rectal bleeding, loose stool with dark red blood clots, left lower quadrant pain, every other month. She underwent upper endoscopy and colonoscopy in [place] in 2015 and was found to have sigmoid diverticulosis, internal hemorrhoids, hiatal hernia. She has developed iron deficiency anemia since 04/2017. Her lowest hemoglobin was 9.9, most recently 10.4, MCV 66.2, ferritin 8 in 01/2018. She saw Dr. Ancil Boozer last week who started her on oral iron supplementation, she is currently taking 1 pill a day. And, she is also taking Protonix once a day. Patient has been taking Excedrin for chronic migraine headaches for last 3-4 years, about 3-4 pills daily. She just recently started on Topamax for migraine headaches by Dr. Ancil Boozer. she had history of elevated transaminases AST greater than AST in 04/2017 which are now back to normal. This was probably in the setting of alcohol use or symptomatic cholelithiasis. She reports feeling tired, fatigued secondary to anemia. She denies active rectal bleeding, abdominal pain, nausea, vomiting,  diarrhea  Follow-up visit 03/25/2018 She saw Dr.Yu at [place] for parenteral iron therapy due to severe iron deficiency. She received 4 infusions so far. She stopped taking Excedrin.Patient reports that her energy levels are significantly better and not experiencing headaches. She is currently constipated. She is taking oral iron 325 mg daily. She liked fusion plus iron supplements and did not cause constipation and requesting more samples. She also lost about 5 pounds in last 4 weeks cutting back on high carb diet. She is not eating during middle of the night and nothing after 7 PM. She denies any other GI symptoms. She denies loss of appetite, fever, chills, nausea, vomiting. She is taking Protonix 2 times daily  Follow up visit 08/12/2018 She went to ER for 2 days history of left upper quadrant pain associated with nausea, rectal bleeding. She underwent CT A/P which revealed left-sided diverticulosis only. She did not have leukocytosis. Lipase, LFTs were normal. She notices small clots associated with bleeding per rectum intermittently.She does report intermittent constipation.she continues to take oral iron daily. She is taking Bentyl as needed for cramps  Follow-up visit 10/14/2018 Patient reports that her left-sided abdominal pain is improving on amitriptyline 20 mg at bedtime.  She also started taking fusion plus which is helping to move her bowels regularly.  She is here to discuss about hemorrhoid ligation.  She underwent EGD and colonoscopy and was found to have large hemorrhoids.  There was no evidence of H. pylori infection on gastric biopsies.  Follow-up visit 10/21/2018 Patient made an urgent visit to see me today because of the pelvic pressure that started about 2 days ago.  I performed hemorrhoid  banding, ligated right posterior hemorrhoid on 10/14/2018.  Patient felt rectal pressure that day, but subsided, asymptomatic until 2 days ago.  She reports severe pressure in her vaginal,  suprapubic area, increased urinary urgency, rectal pressure.  She tried Bentyl which provided some relief.  She was worried that if it was related to banding, therefore called my office.  She told me that she noticed part of the band may have fallen off as she saw some black material in her stool about 2 or 3 days ago   NSAIDs: currently stopped, Excedrin, 3-4 pills daily for chronic headaches for last 3-4 years  Antiplts/Anticoagulants/Anti thrombotics: none  GI Procedures: EGD and colonoscopy  09/08/2018 - Normal duodenal bulb and second portion of the duodenum. - Erythematous mucosa in the antrum. - Normal gastric fundus, gastric body and incisura. Biopsied. - Esophagogastric landmarks identified. - Normal gastroesophageal junction and esophagus.  - The examined portion of the ileum was normal. - Severe diverticulosis in the sigmoid colon and in the descending colon. There was no evidence of diverticular Bleeding. - External and internal hemorrhoids. - Normal mucosa in the entire examined colon. - No specimens collected.   DIAGNOSIS:  A. 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  - IHC FOR HELICOBACTER IS NEGATIVE.  - NEGATIVE FOR DYSPLASIA AND MALIGNANCY.  Repeat EGD and colonoscopy in [place], in 2015 was told that she has hiatal hernia, diverticulosis, internal hemorrhoids Report not available Pathology from gastric biopsies was negative for H. Pylori gastritis. She denies family history of GI malignancy She denies abdominal surgeries She smokes cigarettes, drinks about 4 beers per week She denies IV drug abuse  Past Medical History:  Diagnosis Date  . GERD (gastroesophageal reflux disease)   . Hypertension   . Hypothyroid   . Hypothyroid 01/25/2018  . Iron deficiency anemia due to chronic blood loss 02/16/2018    Past Surgical History:  Procedure Laterality Date    . COLONOSCOPY WITH PROPOFOL N/A 09/08/2018   Procedure: COLONOSCOPY WITH PROPOFOL;  Surgeon: Lin Landsman, MD;  Location: [facility] ENDOSCOPY;  Service: Gastroenterology;  Laterality: N/A;  . ESOPHAGOGASTRODUODENOSCOPY (EGD) WITH PROPOFOL N/A 09/08/2018   Procedure: ESOPHAGOGASTRODUODENOSCOPY (EGD) WITH PROPOFOL;  Surgeon: Lin Landsman, MD;  Location: [facility] ENDOSCOPY;  Service: Gastroenterology;  Laterality: N/A;     Current Outpatient Medications:  .  amitriptyline (ELAVIL) 25 MG tablet, Take 1 tablet (25 mg total) by mouth at bedtime., Disp: 90 tablet, Rfl: 0 .  amLODipine (NORVASC) 5 MG tablet, Take 1 tablet (5 mg total) by mouth daily., Disp: 90 tablet, Rfl: 0 .  baclofen (LIORESAL) 20 MG tablet, Take 1 tablet (20 mg total) by mouth 3 (three) times daily. Prn migraine, Disp: 30 each, Rfl: 0 .  Cetirizine HCl 10 MG CAPS, Take 1 capsule (10 mg total) by mouth daily., Disp: 30 capsule, Rfl: 0 .  cholecalciferol (VITAMIN D) 1000 units tablet, Take 2,000 Units by mouth daily., Disp: , Rfl:  .  dicyclomine (BENTYL) 20 MG tablet, Take 1 tablet (20 mg total) by mouth 4 (four) times daily -  before meals and at bedtime., Disp: 40 tablet, Rfl: 0 .  hydrochlorothiazide (HYDRODIURIL) 12.5 MG tablet, Take 1 tablet (12.5 mg total) by mouth daily., Disp: 90 tablet, Rfl: 0 .  levothyroxine (SYNTHROID, LEVOTHROID) 100 MCG tablet, Take 1 tablet (100 mcg total) by mouth every morning., Disp: 90 tablet, Rfl: 0 .  pantoprazole (PROTONIX) 40 MG tablet, Take 1 tablet (40 mg total) by mouth daily., Disp: 90 tablet, Rfl: 1 .  medroxyPROGESTERone (DEPO-PROVERA) 150 MG/ML injection, Inject 1 mL (150 mg total) into the muscle every 3 (three) months. (Patient not taking: Reported on 07/29/2018), Disp: 1 mL, Rfl: 3 .  nitrofurantoin (MACRODANTIN) 100 MG capsule, Take 1 capsule (100 mg total) by mouth 2 (two) times daily for 5 days., Disp: 10 capsule, Rfl: 0 .  SUMAtriptan (IMITREX) 100 MG tablet, Take 1 tablet (100  mg total) by mouth every 2 (two) hours as needed for migraine. May repeat in 2 hours if headache persists or recurs. (Patient not taking: Reported on 09/08/2018), Disp: 10 tablet, Rfl: 0   Family History  Problem Relation Age of Onset  . Hypertension Mother   . Hypertension Father   . Stroke Father      Social History   Tobacco Use  . Smoking status: Current Every Day Smoker    Packs/day: 0.50    Years: 18.00    Pack years: 9.00    Types: Cigarettes    Start date: 01/26/2000  . Smokeless tobacco: Never Used  . Tobacco comment: contemplating   Substance Use Topics  . Alcohol use: Yes    Alcohol/week: 2.0 standard drinks    Types: 2 Cans of beer per week  . Drug use: No    Allergies as of 10/21/2018 - Review Complete 10/21/2018  Allergen Reaction Noted  . Celebrex [celecoxib]  03/08/2012  . Sesame seed (diagnostic) Itching 01/25/2018  . Lisinopril Rash 03/08/2012    Review of Systems:    All systems reviewed and negative except where noted in HPI.   Physical Exam:  BP 118/82   Pulse 76   Ht 5\' 4"  (1.626 m)   Wt 235 lb 3.2 oz (106.7 kg)   BMI 40.37 kg/m  No LMP recorded.  General:   Alert,  Well-developed, well-nourished, pleasant and cooperative in NAD Head:  Normocephalic and atraumatic. Eyes:  Sclera clear, no icterus.   Conjunctiva pink. Ears:  Normal auditory acuity. Nose:  No deformity, discharge, or lesions. Mouth:  No deformity or lesions,oropharynx pink & moist. Neck:  Supple; no masses or thyromegaly. Lungs:  Respirations even and unlabored.  Clear throughout to auscultation.   No wheezes, crackles, or rhonchi. No acute distress. Heart:  Regular rate and rhythm; no murmurs, clicks, rubs, or gallops. Abdomen:  Normal bowel sounds. Soft, obesity,non-tender and non-distended,  No guarding or rebound tenderness.   Rectal: Rectal exam confirmed prior banding site in the right posterior hemorrhoid location, nontender Msk:  Symmetrical without gross  deformities. Good, equal movement & strength bilaterally. Pulses:  Normal pulses noted. Extremities:  No clubbing or edema.  No cyanosis. Neurologic:  Alert and oriented x3;  grossly normal neurologically. Skin:  Intact without significant lesions or rashes. No jaundice. Psych:  Alert and cooperative. Normal mood and affect.  Imaging Studies: Reviewed   Assessment and Plan:   MEARL HAREWOOD is a 37 y.o. African-American female with metabolic syndrome presents with chronic history of intermittent episodes of left upper/lower quadrant pain, rectal bleeding since 2015, heavy NSAID use for chronic headaches, found to have severe iron deficiency anemia. She had EGD and colonoscopy in 2015, there was no evidence of peptic ulcer disease. She had a hiatal hernia, sigmoid diverticulosis and internal hemorrhoids. The etiology of iron deficiency anemia is multifactorial including NSAID-induced enteropathy, NSAID-induced gastritis, recurrent diverticular bleed in the setting of heavy Excedrin use, bleeding  internal hemorrhoids. Overall, patient is clinically better.  Patient made an urgent visit today due to suprapubic pain, urinary urgency and pelvic pressure.  Rectal exam was unremarkable.  I suspect her symptoms are probably secondary to acute cystitis or UTI. Urine analysis ordered Nitrofurantoin for 5 days, prescribed  Left sided abdominal pain: Continue amitriptyline 25 mg at bedtime Continue to avoid NSAIDs Decrease Protonix to 40 mg once a day for about a month then stop  History of iron deficiency anemia, resolved Had persistent iron deficiency EGD and colonoscopy were unremarkable except for large internal and external hemorrhoids H pylori on gastric biopsies negative H. pylori IgG negative Continue oral iron, patient prefers to have fusion plus as it also helps to regulate her bowel movements   Grade 2 internal hemorrhoids with rectal bleeding Status post ligation of right posterior  hemorrhoid Repeat ligation in 4 weeks   Follow up in 4 weeks  Lynn Darby, MD

## 2018-10-28 ENCOUNTER — Ambulatory Visit: Payer: Self-pay | Admitting: Gastroenterology

## 2018-11-08 ENCOUNTER — Ambulatory Visit: Payer: BLUE CROSS/BLUE SHIELD | Admitting: Family Medicine

## 2018-11-16 ENCOUNTER — Ambulatory Visit: Payer: BLUE CROSS/BLUE SHIELD | Admitting: Family Medicine

## 2018-11-16 ENCOUNTER — Encounter: Payer: Self-pay | Admitting: Family Medicine

## 2018-11-16 VITALS — BP 118/84 | HR 84 | Temp 97.9°F | Resp 16 | Ht 64.0 in | Wt 239.5 lb

## 2018-11-16 DIAGNOSIS — M25511 Pain in right shoulder: Secondary | ICD-10-CM

## 2018-11-16 DIAGNOSIS — G43009 Migraine without aura, not intractable, without status migrainosus: Secondary | ICD-10-CM

## 2018-11-16 DIAGNOSIS — E039 Hypothyroidism, unspecified: Secondary | ICD-10-CM

## 2018-11-16 DIAGNOSIS — R739 Hyperglycemia, unspecified: Secondary | ICD-10-CM

## 2018-11-16 DIAGNOSIS — I10 Essential (primary) hypertension: Secondary | ICD-10-CM

## 2018-11-16 DIAGNOSIS — E8881 Metabolic syndrome: Secondary | ICD-10-CM

## 2018-11-16 DIAGNOSIS — D500 Iron deficiency anemia secondary to blood loss (chronic): Secondary | ICD-10-CM

## 2018-11-16 MED ORDER — LEVOTHYROXINE SODIUM 100 MCG PO TABS: 100.0000 ug | ORAL_TABLET | ORAL | 0 refills | Status: DC

## 2018-11-16 MED ORDER — AMLODIPINE BESYLATE 5 MG PO TABS: 5.0000 mg | ORAL_TABLET | Freq: Every day | ORAL | 0 refills | Status: DC

## 2018-11-16 MED ORDER — MELOXICAM 15 MG PO TABS: 15.0000 mg | ORAL_TABLET | Freq: Every day | ORAL | 0 refills | Status: DC

## 2018-11-16 NOTE — Progress Notes (Signed)
Name: Lynn Wilson   MRN: [id number]    DOB: 1981/06/14   Date:11/16/2018       Progress Note  Subjective  Chief Complaint  Chief Complaint  Patient presents with  . Follow-up  . Arm Pain    right arm starts at shoulders and radiates to hand, finger tips numb    HPI  RIGHT shoulder pain - radiating into the right arm that started today.  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  She has pain with movement, but no weakness. She has history of jittery feeling with Celebrex, but does well with advil and aleve, so we will trial meloxicam. Pain is not originating in her neck. Discussed exercises at length.  Migraine: doing much better since iron infusion, iron storage is back to normal, anemia is now mild.Usually migraine is intense, frontal and sometimes on the top of her head, associated with nausea but no vomiting, also has photophobia and phonophobia with episodes. No episodes in many months - not taking sumatriptan, just excedrin.  Morbid obesity: she states having difficulty cutting down on carbohydrates and sweet drinks. Discussed importance of losing weight to improve her health. She gained 4 lbs since last visit. Discussed importance of increasing physical activity. Unchanged since last visit.  HTN: she used to take hctz 12.50 - she stopped taking it about a month ago. Just taking norvasc 5 mg and is doing well and bp is at goal.. No chest pain, she has some SOB with activity.   Tobacco use: Smoking 1/2ppd.  She has episodes of bronchitis - she has SOB with activity but denies daily cough. She only needs inhaler seldomly.  Hypothyroidism: she is compliant with thyroid medication. She denies significant weight change or hair loss. No change in bowel movements.   Iron deficiency anemia: regular but heavy cycles, she never started Depo but is willing to start - we will have her come in ASAP for this. She also has hemorrhoids  without bleeding and was seeing GI - did an initial banding with Dr. Marius Ditch, she did not like it, so she refuses to go back.  She is getting iron infusions and is feeling well - this helped her headaches. No longer has pica. Is followed by Hematology.  Pre-diabetes/Metabolic Syndrome: she endorses polyphagia and polyuria; denies polydipsia. We will recheck labs today. Last A1C was 6.1  Patient Active Problem List   Diagnosis Date Noted  . LUQ pain   . Rectal bleeding   . Menorrhagia with regular cycle 05/10/2018  . Internal bleeding hemorrhoids 05/10/2018  . Migraine without aura and responsive to treatment 02/16/2018  . Iron deficiency anemia due to chronic blood loss 02/16/2018  . Hyperglycemia 01/29/2018  . Metabolic syndrome 40/07/6760  . Hypertension 01/25/2018  . GERD (gastroesophageal reflux disease) 01/25/2018  . Hypothyroid 01/25/2018  . Morbid obesity ([place]) 01/25/2018    Past Surgical History:  Procedure Laterality Date  . COLONOSCOPY WITH PROPOFOL N/A 09/08/2018   Procedure: COLONOSCOPY WITH PROPOFOL;  Surgeon: Lin Landsman, MD;  Location: [facility] ENDOSCOPY;  Service: Gastroenterology;  Laterality: N/A;  . ESOPHAGOGASTRODUODENOSCOPY (EGD) WITH PROPOFOL N/A 09/08/2018   Procedure: ESOPHAGOGASTRODUODENOSCOPY (EGD) WITH PROPOFOL;  Surgeon: Lin Landsman, MD;  Location: [facility] ENDOSCOPY;  Service: Gastroenterology;  Laterality: N/A;    Family History  Problem Relation Age of Onset  . Hypertension Mother   . Hypertension Father   . Stroke Father     Social History  Socioeconomic History  . Marital status: Married    Spouse name: Windy Canny  . Number of children: 0  . Years of education: Not on file  . Highest education level: Associate degree: occupational, Hotel manager, or vocational program  Occupational History  . Occupation: Chiropractor: [employer]  . Financial resource strain: Somewhat hard  . Food insecurity:    Worry: Sometimes  true    Inability: Sometimes true  . Transportation needs:    Medical: No    Non-medical: No  Tobacco Use  . Smoking status: Current Every Day Smoker    Packs/day: 0.50    Years: 18.00    Pack years: 9.00    Types: Cigarettes    Start date: 01/26/2000  . Smokeless tobacco: Never Used  . Tobacco comment: contemplating   Substance and Sexual Activity  . Alcohol use: Yes    Alcohol/week: 2.0 standard drinks    Types: 2 Cans of beer per week  . Drug use: No  . Sexual activity: Yes    Birth control/protection: Other-see comments    Comment: homosexual   Lifestyle  . Physical activity:    Days per week: 0 days    Minutes per session: 0 min  . Stress: Only a little  Relationships  . Social connections:    Talks on phone: More than three times a week    Gets together: Once a week    Attends religious service: More than 4 times per year    Active member of club or organization: No    Attends meetings of clubs or organizations: Never    Relationship status: Living with partner  . Intimate partner violence:    Fear of current or ex partner: No    Emotionally abused: No    Physically abused: No    Forced sexual activity: No  Other Topics Concern  . Not on file  Social History Narrative   She moved to [place] from [place], [place] in the Summer of 2018   She lives with her girlfriend - homosexual - together for the past 3 years   She does not have any children, but girlfriend has an adult daughter that lives with them.    She is now working at [employer]     Current Outpatient Medications:  .  amitriptyline (ELAVIL) 25 MG tablet, Take 1 tablet (25 mg total) by mouth at bedtime., Disp: 90 tablet, Rfl: 0 .  amLODipine (NORVASC) 5 MG tablet, Take 1 tablet (5 mg total) by mouth daily., Disp: 90 tablet, Rfl: 0 .  baclofen (LIORESAL) 20 MG tablet, Take 1 tablet (20 mg total) by mouth 3 (three) times daily. Prn migraine, Disp: 30 each, Rfl: 0 .  Cetirizine HCl 10 MG CAPS, Take 1 capsule (10  mg total) by mouth daily., Disp: 30 capsule, Rfl: 0 .  cholecalciferol (VITAMIN D) 1000 units tablet, Take 2,000 Units by mouth daily., Disp: , Rfl:  .  dicyclomine (BENTYL) 20 MG tablet, Take 1 tablet (20 mg total) by mouth 4 (four) times daily -  before meals and at bedtime., Disp: 40 tablet, Rfl: 0 .  hydrochlorothiazide (HYDRODIURIL) 12.5 MG tablet, Take 1 tablet (12.5 mg total) by mouth daily., Disp: 90 tablet, Rfl: 0 .  levothyroxine (SYNTHROID, LEVOTHROID) 100 MCG tablet, Take 1 tablet (100 mcg total) by mouth every morning., Disp: 90 tablet, Rfl: 0 .  pantoprazole (PROTONIX) 40 MG tablet, Take 1 tablet (40 mg total) by mouth daily.,  Disp: 90 tablet, Rfl: 1 .  medroxyPROGESTERone (DEPO-PROVERA) 150 MG/ML injection, Inject 1 mL (150 mg total) into the muscle every 3 (three) months. (Patient not taking: Reported on 11/16/2018), Disp: 1 mL, Rfl: 3 .  SUMAtriptan (IMITREX) 100 MG tablet, Take 1 tablet (100 mg total) by mouth every 2 (two) hours as needed for migraine. May repeat in 2 hours if headache persists or recurs. (Patient not taking: Reported on 09/08/2018), Disp: 10 tablet, Rfl: 0  Allergies  Allergen Reactions  . Celebrex [Celecoxib]     "shakes/tremors"  . Sesame Seed (Diagnostic) Itching  . Lisinopril Rash    I personally reviewed active problem list, medication list, allergies, notes from last encounter, lab results with the patient/caregiver today.   ROS  Constitutional: Negative for fever or weight change.  Respiratory: Negative for cough and shortness of breath.   Cardiovascular: Negative for chest pain or palpitations.  Gastrointestinal: Negative for abdominal pain, no bowel changes.  Musculoskeletal: Negative for gait problem or joint swelling. See HPI Skin: Negative for rash.  Neurological: Negative for dizziness or headache.  No other specific complaints in a complete review of systems (except as listed in HPI above).   Objective  Vitals:   11/16/18 1011  BP:  118/84  Pulse: 84  Resp: 16  Temp: 97.9 F (36.6 C)  TempSrc: Oral  SpO2: 99%  Weight: 239 lb 8 oz (108.6 kg)  Height: 5\' 4"  (1.626 m)   Body mass index is 41.11 kg/m.  Physical Exam  Constitutional: Patient appears well-developed and well-nourished, obese. No distress.  HENT: Head: Normocephalic and atraumatic. Nose: Nose normal. Mouth/Throat: Oropharynx is clear and moist. No oropharyngeal exudate or tonsillar swelling.  Eyes: Conjunctivae and EOM are normal. No scleral icterus. Neck: Normal range of motion. Neck supple. No JVD present. No thyromegaly present Cardiovascular: Normal rate, regular rhythm and normal heart sounds.  No murmur heard. No BLE edema. Pulmonary/Chest: Effort normal and breath sounds normal. No respiratory distress. Musculoskeletal: Limited range of motion to right shoulder with abduction, adduction, flexion, extension with and without resistance.  No joint effusions. No gross deformities. LUE is normal on examination. Neurological: Pt is alert and oriented to person, place, and time. No cranial nerve deficit. Coordination, balance, strength, speech and gait are normal.  Skin: Skin is warm and dry. No rash noted. No erythema.  Psychiatric: Patient has a normal mood and affect. behavior is normal. Judgment and thought content normal.  No results found for this or any previous visit (from the past 72 hour(s)).   PHQ2/9: Depression screen [facility] 2/9 11/16/2018 07/11/2018 05/10/2018 01/25/2018  Decreased Interest 0 0 0 0  Down, Depressed, Hopeless 0 0 0 0  PHQ - 2 Score 0 0 0 0  Altered sleeping 2 - 1 -  Tired, decreased energy 0 - 0 -  Change in appetite 0 - 0 -  Feeling bad or failure about yourself  0 - 0 -  Trouble concentrating 0 - 0 -  Moving slowly or fidgety/restless 0 - 0 -  Suicidal thoughts 0 - 0 -  PHQ-9 Score 2 - 1 -  Difficult doing work/chores Not difficult at all - Not difficult at all -   Fall Risk: Fall Risk  11/16/2018 07/11/2018 05/10/2018  03/10/2018 03/10/2018  Falls in the past year? 0 Yes No No No  Number falls in past yr: 0 1 - - -  Injury with Fall? 0 Yes - - -  Comment - Right Ankle - - -  Assessment & Plan  1. Acute pain of right shoulder - Exercises discussed - meloxicam (MOBIC) 15 MG tablet; Take 1 tablet (15 mg total) by mouth daily.  Dispense: 20 tablet; Refill: 0  2. Migraine without aura and responsive to treatment - Stable at this time, Excedrine PRN  3. Morbid obesity ([place]) - Discussed importance of 150 minutes of physical activity weekly, eat two servings of fish weekly, eat one serving of tree nuts ( cashews, pistachios, pecans, almonds.Marland Kitchen) every other day, eat 6 servings of fruit/vegetables daily and drink plenty of water and avoid sweet beverages.  - Hemoglobin A1c - TSH  4. Essential hypertension - COMPLETE METABOLIC PANEL WITH GFR - amLODipine (NORVASC) 5 MG tablet; Take 1 tablet (5 mg total) by mouth daily.  Dispense: 90 tablet; Refill: 0  5. Hypothyroidism, adult - TSH - levothyroxine (SYNTHROID, LEVOTHROID) 100 MCG tablet; Take 1 tablet (100 mcg total) by mouth every morning.  Dispense: 7 tablet; Refill: 0  6. Iron deficiency anemia due to chronic blood loss - Keep follow up with Hematology  7. Hyperglycemia - A1C to be checked today  8. Metabolic syndrome - We will check A1C today, lipids will be done fasting at next visit.

## 2018-11-16 NOTE — Patient Instructions (Signed)

## 2018-11-17 ENCOUNTER — Other Ambulatory Visit: Payer: Self-pay | Admitting: Family Medicine

## 2018-11-17 DIAGNOSIS — E039 Hypothyroidism, unspecified: Secondary | ICD-10-CM

## 2018-11-17 MED ORDER — LEVOTHYROXINE SODIUM 100 MCG PO TABS: 100.0000 ug | ORAL_TABLET | ORAL | 0 refills | Status: DC

## 2018-11-18 ENCOUNTER — Other Ambulatory Visit: Payer: Self-pay | Admitting: Emergency Medicine

## 2018-11-18 ENCOUNTER — Other Ambulatory Visit: Payer: Self-pay | Admitting: Family Medicine

## 2018-11-18 DIAGNOSIS — R740 Nonspecific elevation of levels of transaminase and lactic acid dehydrogenase [LDH]: Principal | ICD-10-CM

## 2018-11-18 DIAGNOSIS — R7401 Elevation of levels of liver transaminase levels: Secondary | ICD-10-CM

## 2018-11-21 ENCOUNTER — Ambulatory Visit: Payer: Self-pay | Admitting: Gastroenterology

## 2018-11-21 LAB — TEST AUTHORIZATION

## 2018-11-21 LAB — COMPLETE METABOLIC PANEL WITH GFR
AG Ratio: 1.2 (calc) (ref 1.0–2.5)
ALT: 47 U/L — ABNORMAL HIGH (ref 6–29)
AST: 58 U/L — ABNORMAL HIGH (ref 10–30)
Albumin: 4.1 g/dL (ref 3.6–5.1)
Alkaline phosphatase (APISO): 102 U/L (ref 33–115)
BUN: 13 mg/dL (ref 7–25)
CO2: 28 mmol/L (ref 20–32)
Calcium: 9.2 mg/dL (ref 8.6–10.2)
Chloride: 103 mmol/L (ref 98–110)
Creat: 0.71 mg/dL (ref 0.50–1.10)
GFR, Est African American: 128 mL/min/{1.73_m2} (ref >=60)
GFR, Est Non African American: 110 mL/min/{1.73_m2} (ref >=60)
Globulin: 3.3 g/dL (calc) (ref 1.9–3.7)
Glucose, Bld: 100 mg/dL — ABNORMAL HIGH (ref 65–99)
Potassium: 4.7 mmol/L (ref 3.5–5.3)
Sodium: 137 mmol/L (ref 135–146)
Total Bilirubin: 0.2 mg/dL (ref 0.2–1.2)
Total Protein: 7.4 g/dL (ref 6.1–8.1)

## 2018-11-21 LAB — HEPATITIS PANEL, ACUTE
Hep A IgM: NONREACTIVE
Hep B C IgM: NONREACTIVE
Hepatitis B Surface Ag: NONREACTIVE
Hepatitis C Ab: NONREACTIVE
SIGNAL TO CUT-OFF: 0.01 (ref <1.00)

## 2018-11-21 LAB — TSH: TSH: 3.92 mIU/L

## 2018-11-21 LAB — HEMOGLOBIN A1C
Hgb A1c MFr Bld: 6.1 % of total Hgb — ABNORMAL HIGH (ref <5.7)
Mean Plasma Glucose: 128 (calc)
eAG (mmol/L): 7.1 (calc)

## 2018-11-23 ENCOUNTER — Ambulatory Visit
Admission: RE | Admit: 2018-11-23 | Discharge: 2018-11-23 | Disposition: A | Discharge status: Home / Self Care | Payer: BLUE CROSS/BLUE SHIELD | Source: Ambulatory Visit | Attending: Family Medicine | Admitting: Family Medicine

## 2018-11-23 DIAGNOSIS — R740 Nonspecific elevation of levels of transaminase and lactic acid dehydrogenase [LDH]: Secondary | ICD-10-CM | POA: Diagnosis not present

## 2018-11-23 DIAGNOSIS — R7401 Elevation of levels of liver transaminase levels: Secondary | ICD-10-CM

## 2018-11-28 ENCOUNTER — Inpatient Hospital Stay: Payer: BLUE CROSS/BLUE SHIELD

## 2018-12-01 ENCOUNTER — Inpatient Hospital Stay: Payer: BLUE CROSS/BLUE SHIELD | Admitting: Oncology

## 2018-12-01 ENCOUNTER — Inpatient Hospital Stay: Payer: BLUE CROSS/BLUE SHIELD

## 2018-12-07 ENCOUNTER — Emergency Department
Admission: EM | Admit: 2018-12-07 | Discharge: 2018-12-07 | Disposition: A | Discharge status: Home / Self Care | Payer: BLUE CROSS/BLUE SHIELD | Attending: Emergency Medicine | Admitting: Emergency Medicine

## 2018-12-07 ENCOUNTER — Other Ambulatory Visit: Payer: Self-pay

## 2018-12-07 ENCOUNTER — Encounter: Payer: Self-pay | Admitting: *Deleted

## 2018-12-07 DIAGNOSIS — F17210 Nicotine dependence, cigarettes, uncomplicated: Secondary | ICD-10-CM | POA: Diagnosis not present

## 2018-12-07 DIAGNOSIS — R110 Nausea: Secondary | ICD-10-CM | POA: Insufficient documentation

## 2018-12-07 DIAGNOSIS — I10 Essential (primary) hypertension: Secondary | ICD-10-CM | POA: Insufficient documentation

## 2018-12-07 DIAGNOSIS — G43801 Other migraine, not intractable, with status migrainosus: Secondary | ICD-10-CM | POA: Insufficient documentation

## 2018-12-07 DIAGNOSIS — R51 Headache: Secondary | ICD-10-CM | POA: Diagnosis present

## 2018-12-07 DIAGNOSIS — Z79899 Other long term (current) drug therapy: Secondary | ICD-10-CM | POA: Diagnosis not present

## 2018-12-07 DIAGNOSIS — E039 Hypothyroidism, unspecified: Secondary | ICD-10-CM | POA: Insufficient documentation

## 2018-12-07 LAB — URINALYSIS, COMPLETE (UACMP) WITH MICROSCOPIC
Bacteria, UA: NONE SEEN
Bilirubin Urine: NEGATIVE
Glucose, UA: NEGATIVE mg/dL
Hgb urine dipstick: NEGATIVE
Ketones, ur: NEGATIVE mg/dL
Leukocytes, UA: NEGATIVE
Nitrite: NEGATIVE
Protein, ur: NEGATIVE mg/dL
Specific Gravity, Urine: 1.017 (ref 1.005–1.030)
pH: 5 (ref 5.0–8.0)

## 2018-12-07 LAB — CBC
HCT: 42.3 % (ref 36.0–46.0)
Hemoglobin: 13.8 g/dL (ref 12.0–15.0)
MCH: 25.4 pg — ABNORMAL LOW (ref 26.0–34.0)
MCHC: 32.6 g/dL (ref 30.0–36.0)
MCV: 77.9 fL — ABNORMAL LOW (ref 80.0–100.0)
Platelets: 380 10*3/uL (ref 150–400)
RBC: 5.43 MIL/uL — ABNORMAL HIGH (ref 3.87–5.11)
RDW: 15.1 % (ref 11.5–15.5)
WBC: 7.3 10*3/uL (ref 4.0–10.5)
nRBC: 0 % (ref 0.0–0.2)

## 2018-12-07 LAB — COMPREHENSIVE METABOLIC PANEL
ALT: 36 U/L (ref 0–44)
AST: 35 U/L (ref 15–41)
Albumin: 4.6 g/dL (ref 3.5–5.0)
Alkaline Phosphatase: 88 U/L (ref 38–126)
Anion gap: 8 (ref 5–15)
BUN: 8 mg/dL (ref 6–20)
CO2: 21 mmol/L — ABNORMAL LOW (ref 22–32)
Calcium: 9 mg/dL (ref 8.9–10.3)
Chloride: 107 mmol/L (ref 98–111)
Creatinine, Ser: 0.54 mg/dL (ref 0.44–1.00)
GFR calc Af Amer: >60 mL/min (ref >60)
GFR calc non Af Amer: >60 mL/min (ref >60)
Glucose, Bld: 140 mg/dL — ABNORMAL HIGH (ref 70–99)
Potassium: 3.7 mmol/L (ref 3.5–5.1)
Sodium: 136 mmol/L (ref 135–145)
Total Bilirubin: 0.6 mg/dL (ref 0.3–1.2)
Total Protein: 8.6 g/dL — ABNORMAL HIGH (ref 6.5–8.1)

## 2018-12-07 LAB — POCT PREGNANCY, URINE: Preg Test, Ur: NEGATIVE

## 2018-12-07 LAB — LIPASE, BLOOD: Lipase: 24 U/L (ref 11–51)

## 2018-12-07 MED ORDER — METOCLOPRAMIDE HCL 10 MG PO TABS: 10.0000 mg | ORAL_TABLET | Freq: Four times a day (QID) | ORAL | 0 refills | Status: DC | PRN

## 2018-12-07 MED ORDER — KETOROLAC TROMETHAMINE 10 MG PO TABS
10.0000 mg | ORAL_TABLET | Freq: Once | ORAL | Status: AC
  Administered 2018-12-07: 10 mg via ORAL
  Filled 2018-12-07: qty 1

## 2018-12-07 MED ORDER — KETOROLAC TROMETHAMINE 10 MG PO TABS: 10.0000 mg | ORAL_TABLET | Freq: Four times a day (QID) | ORAL | 0 refills | Status: DC | PRN

## 2018-12-07 MED ORDER — DIPHENHYDRAMINE HCL 25 MG PO CAPS
50.0000 mg | ORAL_CAPSULE | Freq: Once | ORAL | Status: AC
  Administered 2018-12-07: 50 mg via ORAL
  Filled 2018-12-07: qty 2

## 2018-12-07 MED ORDER — DIPHENHYDRAMINE HCL 25 MG PO CAPS: 50.0000 mg | ORAL_CAPSULE | Freq: Four times a day (QID) | ORAL | 0 refills | Status: DC | PRN

## 2018-12-07 MED ORDER — METOCLOPRAMIDE HCL 10 MG PO TABS
10.0000 mg | ORAL_TABLET | Freq: Once | ORAL | Status: AC
  Administered 2018-12-07: 10 mg via ORAL
  Filled 2018-12-07: qty 1

## 2018-12-07 NOTE — ED Notes (Signed)
Pt advised of needing urine sample when able

## 2018-12-07 NOTE — ED Notes (Signed)
Pt c/o HA behind eyes. Will barely move lips to speak. States moving at all makes it worse. Will barely open eyes.

## 2018-12-07 NOTE — ED Notes (Signed)
Report given to Georgie RN 

## 2018-12-07 NOTE — ED Provider Notes (Signed)
[facility] Emergency Department Provider Note  ____________________________________________  Time seen: Approximately 7:37 PM  I have reviewed the triage vital signs and the nursing notes.   HISTORY  Chief Complaint Headache and Emesis    HPI Lynn Wilson is a 37 y.o. female with a history of GERD hypertension hypothyroidism and migraine headaches who complains of a left-sided headache that started last night, gradual onset, radiating to the occiput.  Exacerbated by lights.  Improved by keeping her eyes closed.  Associated with nausea, no vomiting.  No recent trauma fevers chills or neck stiffness.  Not thunderclap onset.  Moderate intensity, throbbing.      Past Medical History:  Diagnosis Date  . GERD (gastroesophageal reflux disease)   . Hypertension   . Hypothyroid   . Hypothyroid 01/25/2018  . Iron deficiency anemia due to chronic blood loss 02/16/2018     Patient Active Problem List   Diagnosis Date Noted  . LUQ pain   . Rectal bleeding   . Menorrhagia with regular cycle 05/10/2018  . Internal bleeding hemorrhoids 05/10/2018  . Migraine without aura and responsive to treatment 02/16/2018  . Iron deficiency anemia due to chronic blood loss 02/16/2018  . Hyperglycemia 01/29/2018  . Metabolic syndrome 31/51/7616  . Hypertension 01/25/2018  . GERD (gastroesophageal reflux disease) 01/25/2018  . Hypothyroid 01/25/2018  . Morbid obesity ([place]) 01/25/2018     Past Surgical History:  Procedure Laterality Date  . COLONOSCOPY WITH PROPOFOL N/A 09/08/2018   Procedure: COLONOSCOPY WITH PROPOFOL;  Surgeon: Lin Landsman, MD;  Location: [facility] ENDOSCOPY;  Service: Gastroenterology;  Laterality: N/A;  . ESOPHAGOGASTRODUODENOSCOPY (EGD) WITH PROPOFOL N/A 09/08/2018   Procedure: ESOPHAGOGASTRODUODENOSCOPY (EGD) WITH PROPOFOL;  Surgeon: Lin Landsman, MD;  Location: [facility] ENDOSCOPY;  Service: Gastroenterology;  Laterality: N/A;     Prior  to Admission medications   Medication Sig Start Date End Date Taking? Authorizing Provider  amLODipine (NORVASC) 5 MG tablet Take 1 tablet (5 mg total) by mouth daily. 11/16/18   Hubbard Hartshorn, FNP  baclofen (LIORESAL) 20 MG tablet Take 1 tablet (20 mg total) by mouth 3 (three) times daily. Prn migraine 07/11/18   Steele Sizer, MD  Cetirizine HCl 10 MG CAPS Take 1 capsule (10 mg total) by mouth daily. 03/27/18   Triplett, Johnette Abraham B, FNP  cholecalciferol (VITAMIN D) 1000 units tablet Take 2,000 Units by mouth daily.    [provider]  dicyclomine (BENTYL) 20 MG tablet Take 1 tablet (20 mg total) by mouth 4 (four) times daily -  before meals and at bedtime. 07/11/18   Steele Sizer, MD  diphenhydrAMINE (BENADRYL) 25 mg capsule Take 2 capsules (50 mg total) by mouth every 6 (six) hours as needed. 12/07/18   Carrie Mew, MD  ketorolac (TORADOL) 10 MG tablet Take 1 tablet (10 mg total) by mouth every 6 (six) hours as needed for moderate pain. 12/07/18   Carrie Mew, MD  levothyroxine (SYNTHROID, LEVOTHROID) 100 MCG tablet Take 1 tablet (100 mcg total) by mouth every morning. 11/17/18   Hubbard Hartshorn, FNP  medroxyPROGESTERone (DEPO-PROVERA) 150 MG/ML injection Inject 1 mL (150 mg total) into the muscle every 3 (three) months. Patient not taking: Reported on 11/16/2018 05/10/18   Steele Sizer, MD  meloxicam (MOBIC) 15 MG tablet Take 1 tablet (15 mg total) by mouth daily. 11/16/18   Hubbard Hartshorn, FNP  metoCLOPramide (REGLAN) 10 MG tablet Take 1 tablet (10 mg total) by mouth every 6 (six) hours as needed.  12/07/18   Carrie Mew, MD  pantoprazole (PROTONIX) 40 MG tablet Take 1 tablet (40 mg total) by mouth daily. 07/11/18   Steele Sizer, MD     Allergies Celebrex [celecoxib]; Sesame seed (diagnostic); and Lisinopril   Family History  Problem Relation Age of Onset  . Hypertension Mother   . Hypertension Father   . Stroke Father     Social History Social History    Tobacco Use  . Smoking status: Current Every Day Smoker    Packs/day: 0.50    Years: 18.00    Pack years: 9.00    Types: Cigarettes    Start date: 01/26/2000  . Smokeless tobacco: Never Used  . Tobacco comment: contemplating   Substance Use Topics  . Alcohol use: Yes    Alcohol/week: 2.0 standard drinks    Types: 2 Cans of beer per week  . Drug use: No    Review of Systems  Constitutional:   No fever or chills.  ENT:   No sore throat. No rhinorrhea. Cardiovascular:   No chest pain or syncope. Respiratory:   No dyspnea or cough. Gastrointestinal:   Negative for abdominal pain, vomiting and diarrhea.  Musculoskeletal:   Negative for focal pain or swelling All other systems reviewed and are negative except as documented above in ROS and HPI.  ____________________________________________   PHYSICAL EXAM:  VITAL SIGNS: ED Triage Vitals  Enc Vitals Group     BP 12/07/18 1247 (!) 148/87     Pulse Rate 12/07/18 1247 80     Resp 12/07/18 1247 16     Temp 12/07/18 1247 98.4 F (36.9 C)     Temp Source 12/07/18 1247 Oral     SpO2 12/07/18 1247 98 %     Weight 12/07/18 1244 239 lb 8 oz (108.6 kg)     Height 12/07/18 1244 5\' 4"  (1.626 m)     Head Circumference --      Peak Flow --      Pain Score 12/07/18 1243 10     Pain Loc --      Pain Edu? --      Excl. in [place]? --     Vital signs reviewed, nursing assessments reviewed.   Constitutional:   Alert and oriented. Non-toxic appearance. Eyes:   Conjunctivae are normal. EOMI. PERRL.  No nystagmus ENT      Head:   Normocephalic and atraumatic.      Nose:   No congestion/rhinnorhea.       Mouth/Throat:   MMM, no pharyngeal erythema. No peritonsillar mass.       Neck:   No meningismus. Full ROM. Hematological/Lymphatic/Immunilogical:   No cervical lymphadenopathy. Cardiovascular:   RRR. Symmetric bilateral radial and DP pulses.  No murmurs. Cap refill less than 2 seconds. Respiratory:   Normal respiratory effort without  tachypnea/retractions. Breath sounds are clear and equal bilaterally. No wheezes/rales/rhonchi. Gastrointestinal:   Soft and nontender. Non distended. There is no CVA tenderness.  No rebound, rigidity, or guarding. Musculoskeletal:   Normal range of motion in all extremities. No joint effusions.  No lower extremity tenderness.  No edema. Neurologic:   Normal speech and language.  Motor grossly intact. Cranial nerves III through XII intact Normal cerebellar function No acute focal neurologic deficits are appreciated.  Skin:    Skin is warm, dry and intact. No rash noted.  No petechiae, purpura, or bullae.  ____________________________________________    LABS (pertinent positives/negatives) (all labs ordered are listed, but only abnormal results  are displayed) Labs Reviewed  COMPREHENSIVE METABOLIC PANEL - Abnormal; Notable for the following components:      Result Value   CO2 21 (*)    Glucose, Bld 140 (*)    Total Protein 8.6 (*)    All other components within normal limits  CBC - Abnormal; Notable for the following components:   RBC 5.43 (*)    MCV 77.9 (*)    MCH 25.4 (*)    All other components within normal limits  URINALYSIS, COMPLETE (UACMP) WITH MICROSCOPIC - Abnormal; Notable for the following components:   Color, Urine YELLOW (*)    APPearance CLEAR (*)    All other components within normal limits  LIPASE, BLOOD  POC URINE PREG, ED  POCT PREGNANCY, URINE   ____________________________________________   EKG    ____________________________________________    RADIOLOGY  No results found.  ____________________________________________   PROCEDURES Procedures  ____________________________________________    CLINICAL IMPRESSION / ASSESSMENT AND PLAN / ED COURSE  Pertinent labs & imaging results that were available during my care of the patient were reviewed by me and considered in my medical decision making (see chart for details).    Patient  presents with gradual onset left-sided headache consistent with a migraine headache and feels similar to her previous migraine syndrome.Considering the patient's symptoms, medical history, and physical examination today, I have low suspicion for ischemic stroke, intracranial hemorrhage, meningitis, encephalitis, carotid or vertebral dissection, venous sinus thrombosis, MS, intracranial hypertension, glaucoma, CRAO, CRVO, or temporal arteritis. Patient given Toradol Reglan Benadryl, and after period of rest she now reports that she feels much better as of 7:30 PM.  She is tolerating oral intake.  Plan to discharge home to follow-up with primary care.  I will continue her on these oral medications for symptomatic relief.      ____________________________________________   FINAL CLINICAL IMPRESSION(S) / ED DIAGNOSES    Final diagnoses:  Other migraine with status migrainosus, not intractable     ED Discharge Orders         Ordered    ketorolac (TORADOL) 10 MG tablet  Every 6 hours PRN     12/07/18 1937    metoCLOPramide (REGLAN) 10 MG tablet  Every 6 hours PRN     12/07/18 1937    diphenhydrAMINE (BENADRYL) 25 mg capsule  Every 6 hours PRN     12/07/18 1937          Portions of this note were generated with dragon dictation software. Dictation errors may occur despite best attempts at proofreading.   Carrie Mew, MD 12/07/18 [phone]

## 2018-12-07 NOTE — ED Triage Notes (Signed)
Pt to ED reporting headache congestion and vomiting that started yesterday. PT is diaphoretic in lobby with reddend cheeks. Pt unsure if she has had fevers. OTC medications taken with no relief.

## 2018-12-07 NOTE — ED Notes (Signed)
Pt up to use toilet and provide urine sample.

## 2018-12-08 ENCOUNTER — Ambulatory Visit: Payer: BLUE CROSS/BLUE SHIELD | Admitting: Family Medicine

## 2018-12-08 ENCOUNTER — Encounter: Payer: Self-pay | Admitting: Family Medicine

## 2018-12-08 VITALS — BP 134/86 | HR 87 | Temp 98.4°F | Resp 16 | Ht 64.0 in | Wt 236.5 lb

## 2018-12-08 DIAGNOSIS — G43001 Migraine without aura, not intractable, with status migrainosus: Secondary | ICD-10-CM | POA: Diagnosis not present

## 2018-12-08 MED ORDER — PREDNISONE 20 MG PO TABS: 20.0000 mg | ORAL_TABLET | Freq: Two times a day (BID) | ORAL | 0 refills | Status: AC

## 2018-12-08 NOTE — Patient Instructions (Signed)

## 2018-12-08 NOTE — Progress Notes (Signed)
Name: Lynn Wilson   MRN: [id number]    DOB: Nov 02, 1981   Date:12/08/2018       Progress Note  Subjective  Chief Complaint  Chief Complaint  Patient presents with  . Migraine    Since Christmas Eve, on the left side of her face and severe pressure behind her left eye. Has been taking Bendaryl and Migraine medication.  Light sensitivity and her eye has been drooping and facial edema    HPI  PT presents with concern for ongoing migraine. She was seen in the ER for migraine - she notes typical migraine pain - left side of face and photosensitivity - she also endorses nausea and some vomiting. ER rx'd reglan, benadryl, and keterolac, but she never filled these Rx's even though the meds did help her when she took them in the ER.  She will get these filled today, and we will add on prednisone.  She notes pain is not worse today, just still present.  Needs work note for today as well.  Patient Active Problem List   Diagnosis Date Noted  . LUQ pain   . Rectal bleeding   . Menorrhagia with regular cycle 05/10/2018  . Internal bleeding hemorrhoids 05/10/2018  . Migraine without aura and responsive to treatment 02/16/2018  . Iron deficiency anemia due to chronic blood loss 02/16/2018  . Hyperglycemia 01/29/2018  . Metabolic syndrome 86/76/1950  . Hypertension 01/25/2018  . GERD (gastroesophageal reflux disease) 01/25/2018  . Hypothyroid 01/25/2018  . Morbid obesity ([place]) 01/25/2018    Social History   Tobacco Use  . Smoking status: Current Every Day Smoker    Packs/day: 0.50    Years: 18.00    Pack years: 9.00    Types: Cigarettes    Start date: 01/26/2000  . Smokeless tobacco: Never Used  . Tobacco comment: contemplating   Substance Use Topics  . Alcohol use: Yes    Alcohol/week: 2.0 standard drinks    Types: 2 Cans of beer per week     Current Outpatient Medications:  .  amLODipine (NORVASC) 5 MG tablet, Take 1 tablet (5 mg total) by mouth daily., Disp: 90 tablet,  Rfl: 0 .  baclofen (LIORESAL) 20 MG tablet, Take 1 tablet (20 mg total) by mouth 3 (three) times daily. Prn migraine, Disp: 30 each, Rfl: 0 .  Cetirizine HCl 10 MG CAPS, Take 1 capsule (10 mg total) by mouth daily., Disp: 30 capsule, Rfl: 0 .  cholecalciferol (VITAMIN D) 1000 units tablet, Take 2,000 Units by mouth daily., Disp: , Rfl:  .  dicyclomine (BENTYL) 20 MG tablet, Take 1 tablet (20 mg total) by mouth 4 (four) times daily -  before meals and at bedtime., Disp: 40 tablet, Rfl: 0 .  diphenhydrAMINE (BENADRYL) 25 mg capsule, Take 2 capsules (50 mg total) by mouth every 6 (six) hours as needed., Disp: 60 capsule, Rfl: 0 .  ketorolac (TORADOL) 10 MG tablet, Take 1 tablet (10 mg total) by mouth every 6 (six) hours as needed for moderate pain., Disp: 12 tablet, Rfl: 0 .  levothyroxine (SYNTHROID, LEVOTHROID) 100 MCG tablet, Take 1 tablet (100 mcg total) by mouth every morning., Disp: 90 tablet, Rfl: 0 .  medroxyPROGESTERone (DEPO-PROVERA) 150 MG/ML injection, Inject 1 mL (150 mg total) into the muscle every 3 (three) months., Disp: 1 mL, Rfl: 3 .  meloxicam (MOBIC) 15 MG tablet, Take 1 tablet (15 mg total) by mouth daily., Disp: 20 tablet, Rfl: 0 .  metoCLOPramide (REGLAN) 10 MG  tablet, Take 1 tablet (10 mg total) by mouth every 6 (six) hours as needed., Disp: 30 tablet, Rfl: 0 .  pantoprazole (PROTONIX) 40 MG tablet, Take 1 tablet (40 mg total) by mouth daily., Disp: 90 tablet, Rfl: 1  Allergies  Allergen Reactions  . Celebrex [Celecoxib]     "shakes/tremors"  . Sesame Seed (Diagnostic) Itching  . Lisinopril Rash    I personally reviewed active problem list, medication list, allergies, notes from last encounter, lab results with the patient/caregiver today.  ROS  Ten systems reviewed and is negative except as mentioned in HPI  Objective  Vitals:   12/08/18 1410  BP: 134/86  Pulse: 87  Resp: 16  Temp: 98.4 F (36.9 C)  TempSrc: Oral  SpO2: 99%  Weight: 236 lb 8 oz (107.3 kg)    Height: 5\' 4"  (1.626 m)   Body mass index is 40.6 kg/m.  Nursing Note and Vital Signs reviewed.  Physical Exam  Constitutional: Patient appears well-developed and well-nourished. No distress.  HENT: Head: Normocephalic and atraumatic. Nose: Nose normal. Eyes: Conjunctivae- LEFT eye has small subconjunctival hemorrhage, otherwise normal. EOM's are normal. No scleral icterus.  Pupils are equal, round, and reactive to light.  Neck: Normal range of motion. Neck supple. No JVD present. No thyromegaly present.  Cardiovascular: Normal rate, regular rhythm and normal heart sounds.  No murmur heard. No BLE edema. Pulmonary/Chest: Effort normal and breath sounds normal. No respiratory distress. Abdominal: Soft. Bowel sounds are normal, no distension. There is no tenderness. No masses. Musculoskeletal: Normal range of motion, no joint effusions. No gross deformities Neurological: Pt is alert and oriented to person, place, and time. No cranial nerve deficit. Coordination, balance, strength, speech and gait are normal.  Skin: Skin is warm and dry. No rash noted. No erythema.  Psychiatric: Patient has a normal mood and affect. behavior is normal. Judgment and thought content normal.  Results for orders placed or performed during the hospital encounter of 12/07/18 (from the past 72 hour(s))  Lipase, blood     Status: None   Collection Time: 12/07/18 12:50 PM  Result Value Ref Range   Lipase 24 11 - 51 U/L    Comment: Performed at [facility], [address] [place], [place] [address]  Comprehensive metabolic panel     Status: Abnormal   Collection Time: 12/07/18 12:50 PM  Result Value Ref Range   Sodium 136 135 - 145 mmol/L   Potassium 3.7 3.5 - 5.1 mmol/L   Chloride 107 98 - 111 mmol/L   CO2 21 (L) 22 - 32 mmol/L   Glucose, Bld 140 (H) 70 - 99 mg/dL   BUN 8 6 - 20 mg/dL   Creatinine, Ser 0.54 0.44 - 1.00 mg/dL   Calcium 9.0 8.9 - 10.3 mg/dL   Total Protein 8.6 (H) 6.5 - 8.1 g/dL    Albumin 4.6 3.5 - 5.0 g/dL   AST 35 15 - 41 U/L   ALT 36 0 - 44 U/L   Alkaline Phosphatase 88 38 - 126 U/L   Total Bilirubin 0.6 0.3 - 1.2 mg/dL   GFR calc non Af Amer >60 >60 mL/min   GFR calc Af Amer >60 >60 mL/min   Anion gap 8 5 - 15    Comment: Performed at [facility], [address] [place], [place] [address]  CBC     Status: Abnormal   Collection Time: 12/07/18 12:50 PM  Result Value Ref Range   WBC 7.3 4.0 -  10.5 K/uL   RBC 5.43 (H) 3.87 - 5.11 MIL/uL   Hemoglobin 13.8 12.0 - 15.0 g/dL   HCT 42.3 36.0 - 46.0 %   MCV 77.9 (L) 80.0 - 100.0 fL   MCH 25.4 (L) 26.0 - 34.0 pg   MCHC 32.6 30.0 - 36.0 g/dL   RDW 15.1 11.5 - 15.5 %   Platelets 380 150 - 400 K/uL   nRBC 0.0 0.0 - 0.2 %    Comment: Performed at [facility], [address] [place], [place] [address]  Urinalysis, Complete w Microscopic     Status: Abnormal   Collection Time: 12/07/18  4:44 PM  Result Value Ref Range   Color, Urine YELLOW (A) YELLOW   APPearance CLEAR (A) CLEAR   Specific Gravity, Urine 1.017 1.005 - 1.030   pH 5.0 5.0 - 8.0   Glucose, UA NEGATIVE NEGATIVE mg/dL   Hgb urine dipstick NEGATIVE NEGATIVE   Bilirubin Urine NEGATIVE NEGATIVE   Ketones, ur NEGATIVE NEGATIVE mg/dL   Protein, ur NEGATIVE NEGATIVE mg/dL   Nitrite NEGATIVE NEGATIVE   Leukocytes, UA NEGATIVE NEGATIVE   RBC / HPF 0-5 0 - 5 RBC/hpf   WBC, UA 0-5 0 - 5 WBC/hpf   Bacteria, UA NONE SEEN NONE SEEN   Squamous Epithelial / LPF 0-5 0 - 5   Mucus PRESENT     Comment: Performed at [facility], [address] [place],  [address]  Pregnancy, urine POC     Status: None   Collection Time: 12/07/18  4:50 PM  Result Value Ref Range   Preg Test, Ur NEGATIVE NEGATIVE    Comment:        THE SENSITIVITY OF THIS METHODOLOGY IS >24 mIU/mL    Assessment & Plan  1. Migraine without aura and with status migrainosus, not intractable - predniSONE (DELTASONE) 20 MG tablet; Take 1 tablet (20 mg  total) by mouth 2 (two) times daily with a meal for 1 day.  Dispense: 2 tablet; Refill: 0 - Fill other medications from ER and take today and tomorrow, call back if not improving by tomorrow afternoon.  -Red flags and when to present for emergency care or RTC including fever >101.51F, chest pain, shortness of breath, new/worsening/un-resolving symptoms, signs and symptoms of stroke reviewed with patient at time of visit. Follow up and care instructions discussed and provided in AVS.

## 2019-01-09 ENCOUNTER — Ambulatory Visit: Payer: Self-pay | Admitting: Gastroenterology

## 2019-01-27 ENCOUNTER — Ambulatory Visit: Payer: Self-pay | Admitting: Gastroenterology

## 2019-01-27 ENCOUNTER — Encounter: Payer: Self-pay | Admitting: Gastroenterology

## 2019-02-15 ENCOUNTER — Ambulatory Visit: Payer: BLUE CROSS/BLUE SHIELD | Admitting: Family Medicine

## 2019-02-22 ENCOUNTER — Ambulatory Visit: Payer: BLUE CROSS/BLUE SHIELD | Admitting: Family Medicine

## 2019-02-22 ENCOUNTER — Other Ambulatory Visit: Payer: Self-pay

## 2019-02-22 ENCOUNTER — Encounter: Payer: Self-pay | Admitting: Family Medicine

## 2019-02-22 VITALS — BP 140/90 | HR 95 | Temp 98.3°F | Resp 16 | Ht 64.0 in | Wt 240.3 lb

## 2019-02-22 DIAGNOSIS — M25512 Pain in left shoulder: Secondary | ICD-10-CM | POA: Diagnosis not present

## 2019-02-22 DIAGNOSIS — E039 Hypothyroidism, unspecified: Secondary | ICD-10-CM

## 2019-02-22 DIAGNOSIS — I10 Essential (primary) hypertension: Secondary | ICD-10-CM

## 2019-02-22 DIAGNOSIS — G43001 Migraine without aura, not intractable, with status migrainosus: Secondary | ICD-10-CM

## 2019-02-22 DIAGNOSIS — R7303 Prediabetes: Secondary | ICD-10-CM

## 2019-02-22 DIAGNOSIS — G8929 Other chronic pain: Secondary | ICD-10-CM

## 2019-02-22 DIAGNOSIS — G43009 Migraine without aura, not intractable, without status migrainosus: Secondary | ICD-10-CM

## 2019-02-22 DIAGNOSIS — K219 Gastro-esophageal reflux disease without esophagitis: Secondary | ICD-10-CM

## 2019-02-22 DIAGNOSIS — E8881 Metabolic syndrome: Secondary | ICD-10-CM | POA: Diagnosis not present

## 2019-02-22 DIAGNOSIS — K2950 Unspecified chronic gastritis without bleeding: Secondary | ICD-10-CM | POA: Insufficient documentation

## 2019-02-22 MED ORDER — DICYCLOMINE HCL 20 MG PO TABS: 20.0000 mg | ORAL_TABLET | Freq: Three times a day (TID) | ORAL | 0 refills | Status: DC

## 2019-02-22 MED ORDER — LEVOTHYROXINE SODIUM 100 MCG PO TABS: 100.0000 ug | ORAL_TABLET | ORAL | 1 refills | Status: DC

## 2019-02-22 MED ORDER — BACLOFEN 20 MG PO TABS: 20.0000 mg | ORAL_TABLET | Freq: Three times a day (TID) | ORAL | 0 refills | Status: DC

## 2019-02-22 MED ORDER — AMLODIPINE BESYLATE 5 MG PO TABS: 5.0000 mg | ORAL_TABLET | Freq: Every day | ORAL | 1 refills | Status: DC

## 2019-02-22 MED ORDER — SUMATRIPTAN SUCCINATE 100 MG PO TABS: 100.0000 mg | ORAL_TABLET | ORAL | 0 refills | Status: DC | PRN

## 2019-02-22 MED ORDER — PANTOPRAZOLE SODIUM 40 MG PO TBEC: 40.0000 mg | DELAYED_RELEASE_TABLET | Freq: Two times a day (BID) | ORAL | 1 refills | Status: DC

## 2019-02-22 MED ORDER — HYDROCHLOROTHIAZIDE 12.5 MG PO TABS: 12.5000 mg | ORAL_TABLET | Freq: Every day | ORAL | 1 refills | Status: DC

## 2019-02-22 NOTE — Progress Notes (Signed)
Name: Lynn Wilson   MRN: [id number]    DOB: August 31, 1981   Date:02/22/2019       Progress Note  Subjective  Chief Complaint  Chief Complaint  Patient presents with  . Hypertension  . Hypothyroidism  . Anemia  . Migraine    HPI  Left  shoulder pain - She states she has a long history of left shoulder pain, she works in Water engineer and states over the past couple of days pain has been worse, pain with any movement at this time, worse on anterior left shoulder, no redness of increase in warmth, took Meloxicam in the past but has gastritis and advised Tylenol and referral to Ortho. She never had x-rays or steroid injections on shoulder.   Migraine: doing much better since iron infusion, iron storage is back to normal, anemia is now mild.She had a severe episode in Dec that required evaluation by EC, associated with nausea but no vomiting, also has photophobia and phonophobia with episodes. She would like to go back on Imitrex prn   Morbid obesity: she states having difficulty cutting down on carbohydrates and sweet drinks. Discussed importance of losing weight to improve her health. She gained another 3 lbs since last visit. Discussed importance of increasing physical activity.  HTN: she is back on HCTZ 12.5 and norvasc 5 mg, bp today is 140/90 but usually at goal, she states went to bed very late and is tired. No chest pain or palpitation   Tobacco use: Smoking 1/2ppd.  She has episodes of bronchitis , she denies cough, wheezing or SOB   Hypothyroidism: sheis compliant with thyroid medication. No change in bowel movements she has chronic dry skin, TSH stable   Iron deficiency anemia: regular but heavy cycles,she never startedDepo but is willing to start.  She also has hemorrhoids without rectal bleeding, chronic gastritis per EGD, seen by Dr. Marius Ditch and is taking Protonix bid since. Only had one hemorrhoid banding because of pain did not go back  .  Pre-diabetes/Metabolic Syndrome: she endorses polyphagia and polyuria; denies polydipsia.  Last A1C was 6.1   Patient Active Problem List   Diagnosis Date Noted  . Chronic left shoulder pain 02/22/2019  . Chronic gastritis without bleeding 02/22/2019  . Menorrhagia with regular cycle 05/10/2018  . Internal bleeding hemorrhoids 05/10/2018  . Migraine without aura and responsive to treatment 02/16/2018  . Iron deficiency anemia due to chronic blood loss 02/16/2018  . Hyperglycemia 01/29/2018  . Metabolic syndrome 93/90/3009  . Hypertension 01/25/2018  . GERD (gastroesophageal reflux disease) 01/25/2018  . Hypothyroid 01/25/2018  . Morbid obesity ([place]) 01/25/2018    Past Surgical History:  Procedure Laterality Date  . COLONOSCOPY WITH PROPOFOL N/A 09/08/2018   Procedure: COLONOSCOPY WITH PROPOFOL;  Surgeon: Lin Landsman, MD;  Location: [facility] ENDOSCOPY;  Service: Gastroenterology;  Laterality: N/A;  . ESOPHAGOGASTRODUODENOSCOPY (EGD) WITH PROPOFOL N/A 09/08/2018   Procedure: ESOPHAGOGASTRODUODENOSCOPY (EGD) WITH PROPOFOL;  Surgeon: Lin Landsman, MD;  Location: [facility] ENDOSCOPY;  Service: Gastroenterology;  Laterality: N/A;    Family History  Problem Relation Age of Onset  . Hypertension Mother   . Hypertension Father   . Stroke Father     Social History   Socioeconomic History  . Marital status: Married    Spouse name: Windy Canny  . Number of children: 0  . Years of education: Not on file  . Highest education level: Associate degree: occupational, Hotel manager, or vocational program  Occupational History  . Occupation: Retail buyer  Employer: [employer]  Social Needs  . Financial resource strain: Somewhat hard  . Food insecurity:    Worry: Sometimes true    Inability: Sometimes true  . Transportation needs:    Medical: No    Non-medical: No  Tobacco Use  . Smoking status: Current Every Day Smoker    Packs/day: 0.50    Years: 19.00    Pack years: 9.50     Types: Cigarettes    Start date: 01/26/2000  . Smokeless tobacco: Never Used  . Tobacco comment: contemplating   Substance and Sexual Activity  . Alcohol use: Yes    Alcohol/week: 2.0 standard drinks    Types: 2 Cans of beer per week  . Drug use: No  . Sexual activity: Yes    Birth control/protection: Other-see comments    Comment: homosexual   Lifestyle  . Physical activity:    Days per week: 0 days    Minutes per session: 0 min  . Stress: Only a little  Relationships  . Social connections:    Talks on phone: More than three times a week    Gets together: Once a week    Attends religious service: More than 4 times per year    Active member of club or organization: No    Attends meetings of clubs or organizations: Never    Relationship status: Living with partner  . Intimate partner violence:    Fear of current or ex partner: No    Emotionally abused: No    Physically abused: No    Forced sexual activity: No  Other Topics Concern  . Not on file  Social History Narrative   She moved to [place] from [place], [place] in the Summer of 2018   She lives with her wife ( married since 02/19/2018 - homosexual    She does not have any children, but girlfriend has an adult daughter    She is now working at [employer]     Current Outpatient Medications:  .  amLODipine (NORVASC) 5 MG tablet, Take 1 tablet (5 mg total) by mouth daily., Disp: 90 tablet, Rfl: 0 .  baclofen (LIORESAL) 20 MG tablet, Take 1 tablet (20 mg total) by mouth 3 (three) times daily. Prn migraine, Disp: 30 each, Rfl: 0 .  Cetirizine HCl 10 MG CAPS, Take 1 capsule (10 mg total) by mouth daily., Disp: 30 capsule, Rfl: 0 .  cholecalciferol (VITAMIN D) 1000 units tablet, Take 2,000 Units by mouth daily., Disp: , Rfl:  .  dicyclomine (BENTYL) 20 MG tablet, Take 1 tablet (20 mg total) by mouth 4 (four) times daily -  before meals and at bedtime., Disp: 40 tablet, Rfl: 0 .  levothyroxine (SYNTHROID, LEVOTHROID) 100 MCG tablet,  Take 1 tablet (100 mcg total) by mouth every morning., Disp: 90 tablet, Rfl: 0 .  medroxyPROGESTERone (DEPO-PROVERA) 150 MG/ML injection, Inject 1 mL (150 mg total) into the muscle every 3 (three) months., Disp: 1 mL, Rfl: 3 .  meloxicam (MOBIC) 15 MG tablet, Take 1 tablet (15 mg total) by mouth daily., Disp: 20 tablet, Rfl: 0 .  pantoprazole (PROTONIX) 40 MG tablet, Take 1 tablet (40 mg total) by mouth daily., Disp: 90 tablet, Rfl: 1 .  diphenhydrAMINE (BENADRYL) 25 mg capsule, Take 2 capsules (50 mg total) by mouth every 6 (six) hours as needed. (Patient not taking: Reported on 02/22/2019), Disp: 60 capsule, Rfl: 0 .  ketorolac (TORADOL) 10 MG tablet, Take 1 tablet (10 mg total) by mouth every 6 (  six) hours as needed for moderate pain. (Patient not taking: Reported on 02/22/2019), Disp: 12 tablet, Rfl: 0 .  metoCLOPramide (REGLAN) 10 MG tablet, Take 1 tablet (10 mg total) by mouth every 6 (six) hours as needed. (Patient not taking: Reported on 02/22/2019), Disp: 30 tablet, Rfl: 0  Allergies  Allergen Reactions  . Celebrex [Celecoxib]     "shakes/tremors"  . Sesame Seed (Diagnostic) Itching  . Lisinopril Rash    I personally reviewed active problem list, medication list, allergies, family history, social history with the patient/caregiver today.   ROS  Constitutional: Negative for fever or significant weight change.  Respiratory: Negative for cough and shortness of breath.   Cardiovascular: Negative for chest pain or palpitations.  Gastrointestinal: Negative for abdominal pain, no bowel changes.  Musculoskeletal: Negative for gait problem or joint swelling.  Skin: Negative for rash.  Neurological: Negative for dizziness or headache.  No other specific complaints in a complete review of systems (except as listed in HPI above).  Objective  Vitals:   02/22/19 0738  BP: 140/90  Pulse: 95  Resp: 16  Temp: 98.3 F (36.8 C)  TempSrc: Oral  SpO2: 99%  Weight: 240 lb 4.8 oz (109 kg)   Height: 5\' 4"  (1.626 m)    Body mass index is 41.25 kg/m.  Physical Exam  Constitutional: Patient appears well-developed and well-nourished. Obese No distress.  HEENT: head atraumatic, normocephalic, pupils equal and reactive to light,  neck supple, throat within normal limits Cardiovascular: Normal rate, regular rhythm and normal heart sounds.  No murmur heard. No BLE edema. Pulmonary/Chest: Effort normal and breath sounds normal. No respiratory distress. Abdominal: Soft.  There is no tenderness. Muscular Skeletal: decrease in abduction ( able to raise to 90 degrees ) pain with internal rotation and positive empty can sign  Psychiatric: Patient has a normal mood and affect. behavior is normal. Judgment and thought content normal.   Recent Results (from the past 2160 hour(s))  Lipase, blood     Status: None   Collection Time: 12/07/18 12:50 PM  Result Value Ref Range   Lipase 24 11 - 51 U/L    Comment: Performed at [facility], [address] [place], [place] [address]  Comprehensive metabolic panel     Status: Abnormal   Collection Time: 12/07/18 12:50 PM  Result Value Ref Range   Sodium 136 135 - 145 mmol/L   Potassium 3.7 3.5 - 5.1 mmol/L   Chloride 107 98 - 111 mmol/L   CO2 21 (L) 22 - 32 mmol/L   Glucose, Bld 140 (H) 70 - 99 mg/dL   BUN 8 6 - 20 mg/dL   Creatinine, Ser 0.54 0.44 - 1.00 mg/dL   Calcium 9.0 8.9 - 10.3 mg/dL   Total Protein 8.6 (H) 6.5 - 8.1 g/dL   Albumin 4.6 3.5 - 5.0 g/dL   AST 35 15 - 41 U/L   ALT 36 0 - 44 U/L   Alkaline Phosphatase 88 38 - 126 U/L   Total Bilirubin 0.6 0.3 - 1.2 mg/dL   GFR calc non Af Amer >60 >60 mL/min   GFR calc Af Amer >60 >60 mL/min   Anion gap 8 5 - 15    Comment: Performed at [facility], [address] [place], [place] [address]  CBC     Status: Abnormal   Collection Time: 12/07/18 12:50 PM  Result Value Ref Range   WBC 7.3 4.0 - 10.5 K/uL   RBC 5.43 (H) 3.87 -  5.11 MIL/uL   Hemoglobin 13.8  12.0 - 15.0 g/dL   HCT 42.3 36.0 - 46.0 %   MCV 77.9 (L) 80.0 - 100.0 fL   MCH 25.4 (L) 26.0 - 34.0 pg   MCHC 32.6 30.0 - 36.0 g/dL   RDW 15.1 11.5 - 15.5 %   Platelets 380 150 - 400 K/uL   nRBC 0.0 0.0 - 0.2 %    Comment: Performed at [facility], [address] [place], [place] [address]  Urinalysis, Complete w Microscopic     Status: Abnormal   Collection Time: 12/07/18  4:44 PM  Result Value Ref Range   Color, Urine YELLOW (A) YELLOW   APPearance CLEAR (A) CLEAR   Specific Gravity, Urine 1.017 1.005 - 1.030   pH 5.0 5.0 - 8.0   Glucose, UA NEGATIVE NEGATIVE mg/dL   Hgb urine dipstick NEGATIVE NEGATIVE   Bilirubin Urine NEGATIVE NEGATIVE   Ketones, ur NEGATIVE NEGATIVE mg/dL   Protein, ur NEGATIVE NEGATIVE mg/dL   Nitrite NEGATIVE NEGATIVE   Leukocytes, UA NEGATIVE NEGATIVE   RBC / HPF 0-5 0 - 5 RBC/hpf   WBC, UA 0-5 0 - 5 WBC/hpf   Bacteria, UA NONE SEEN NONE SEEN   Squamous Epithelial / LPF 0-5 0 - 5   Mucus PRESENT     Comment: Performed at [facility], [address] [place], [place] [address]  Pregnancy, urine POC     Status: None   Collection Time: 12/07/18  4:50 PM  Result Value Ref Range   Preg Test, Ur NEGATIVE NEGATIVE    Comment:        THE SENSITIVITY OF THIS METHODOLOGY IS >24 mIU/mL       PHQ2/9: Depression screen [facility] 2/9 02/22/2019 11/16/2018 07/11/2018 05/10/2018 01/25/2018  Decreased Interest 0 0 0 0 0  Down, Depressed, Hopeless 0 0 0 0 0  PHQ - 2 Score 0 0 0 0 0  Altered sleeping 3 2 - 1 -  Tired, decreased energy 2 0 - 0 -  Change in appetite 3 0 - 0 -  Feeling bad or failure about yourself  0 0 - 0 -  Trouble concentrating 0 0 - 0 -  Moving slowly or fidgety/restless 0 0 - 0 -  Suicidal thoughts 0 0 - 0 -  PHQ-9 Score 8 2 - 1 -  Difficult doing work/chores Somewhat difficult Not difficult at all - Not difficult at all -   phq 9 positive, she is feeling a little down, wife has been out of work and recovering from hand  problems   Fall Risk: Fall Risk  02/22/2019 11/16/2018 07/11/2018 05/10/2018 03/10/2018  Falls in the past year? 0 0 Yes No No  Number falls in past yr: 0 0 1 - -  Injury with Fall? 0 0 Yes - -  Comment - - Right Ankle - -     Assessment & Plan  1. Chronic left shoulder pain  - Ambulatory referral to Orthopedic Surgery  2. Morbid obesity ([place])  She just bought a stationary bike  3. Migraine without aura and with status migrainosus, not intractable  She had a severe episode in December and had to go to [facility], she is wiling to have imitrex at home, discussed side effects  - SUMAtriptan (IMITREX) 100 MG tablet; Take 1 tablet (100 mg total) by mouth every 2 (two) hours as needed for migraine. Ma - baclofen (LIORESAL) 20 MG tablet; Take 1 tablet (20 mg total)  by mouth 3 (three) times daily. Prn migraine  Dispense: 30 each; Refill: 0y repeat in 2 hours if headache persists or recurs.  Dispense: 10 tablet; Refill: 0  4. Metabolic syndrome  Discussed low carb diet again with patient   5. Essential hypertension  - amLODipine (NORVASC) 5 MG tablet; Take 1 tablet (5 mg total) by mouth daily.  Dispense: 90 tablet; Refill: 1 - hydrochlorothiazide (HYDRODIURIL) 12.5 MG tablet; Take 1 tablet (12.5 mg total) by mouth daily.  Dispense: 90 tablet; Refill: 1  6. Pre-diabetes   7. Chronic gastritis without bleeding, unspecified gastritis type  Seen by Dr. Marius Ditch on PPI states dose changed to two daily   8. Hypothyroidism, adult  - levothyroxine (SYNTHROID, LEVOTHROID) 100 MCG tablet; Take 1 tablet (100 mcg total) by mouth every morning.  Dispense: 90 tablet; Refill: 1

## 2019-03-03 ENCOUNTER — Ambulatory Visit: Payer: Self-pay | Admitting: *Deleted

## 2019-03-03 ENCOUNTER — Telehealth: Payer: BLUE CROSS/BLUE SHIELD | Admitting: Family

## 2019-03-03 DIAGNOSIS — R05 Cough: Secondary | ICD-10-CM

## 2019-03-03 DIAGNOSIS — R059 Cough, unspecified: Secondary | ICD-10-CM

## 2019-03-03 DIAGNOSIS — R6889 Other general symptoms and signs: Secondary | ICD-10-CM

## 2019-03-03 MED ORDER — BENZONATATE 100 MG PO CAPS: 100.0000 mg | ORAL_CAPSULE | Freq: Three times a day (TID) | ORAL | 0 refills | Status: DC | PRN

## 2019-03-03 NOTE — Telephone Encounter (Signed)
  Patient is concerned about possible exposure at party this weekend- she is having cough and chest pain/tightness. Advised E-visit.  Answer Assessment - Initial Assessment Questions 1. CONFIRMED CASE: "Who is the person with the confirmed COVID-19 infection that you were exposed to?"     No known contat 2. PLACE of CONTACT: "Where were you when you were exposed to COVID-19  (coronavirus disease 2019)?" (e.g., city, state, country)     [place], [place]- party with outside guest  3. TYPE of CONTACT: "How much contact was there?" (e.g., live in same house, work in same office, same school)     [place]. DATE of CONTACT: "When did you have contact with a coronavirus patient?" (e.g., days)     02/26/19 5. DURATION of CONTACT: "How long were you in contact with the COVID-19 (coronavirus disease) patient?" (e.g., a few seconds, passed by person, a few minutes, live with the patient)     At event- 1 1/2 hours 6. SYMPTOMS: "Do you have any symptoms?" (e.g., fever, cough, breathing difficulty)     Cough, chest pain/tightness, headache 7. PREGNANCY OR POSTPARTUM: "Is there any chance you are pregnant?" "When was your last menstrual period?" "Did you deliver in the last 2 weeks?"     No- LMP- 02/18/19 8. HIGH RISK: "Do you have any heart or lung problems? Do you have a weakened immune system?" (e.g., CHF, COPD, asthma, HIV positive, chemotherapy, renal failure, diabetes mellitus, sickle cell anemia)     no  Protocols used: CORONAVIRUS (COVID-19) EXPOSURE-A-AH

## 2019-03-03 NOTE — Progress Notes (Signed)
E-Visit for Corona Virus Screening  Based on your current symptoms, you may very well have the virus, however your symptoms are mild. Currently, not all patients are being tested. If the symptoms are mild and there is not a known exposure, performing the test is not indicated.   Approximately 5 minutes was spent documenting and reviewing patient's chart.    Coronavirus disease 2019 (COVID-19) is a respiratory illness that can spread from person to person. The virus that causes COVID-19 is a new virus that was first identified in the country of [place] but is now found in multiple other countries and has spread to the [place].  Symptoms associated with the virus are mild to severe fever, cough, and shortness of breath. There is currently no vaccine to protect against COVID-19, and there is no specific antiviral treatment for the virus.   To be considered HIGH RISK for Coronavirus (COVID-19), you have to meet the following criteria:  . Traveled to [place], [place], [place], [place] or [place]; or in the [place] to [place], [place], [place], or [place]; and have fever, cough, and shortness of breath within the last 2 weeks of travel OR  . Been in close contact with a person diagnosed with COVID-19 within the last 2 weeks and have fever, cough, and shortness of breath  . IF YOU DO NOT MEET THESE CRITERIA, YOU ARE CONSIDERED LOW RISK FOR COVID-19.   It is vitally important that if you feel that you have an infection such as this virus or any other virus that you stay home and away from places where you may spread it to others.  You should self-quarantine for 14 days if you have symptoms that could potentially be coronavirus and avoid contact with people age 65 and older.   You can use medication such as A prescription cough medication called Tessalon Perles 100 mg. You may take 1-2 capsules every 8 hours as needed for cough  You may also take acetaminophen (Tylenol) as needed for  fever.   Reduce your risk of any infection by using the same precautions used for avoiding the common cold or flu:  . Wash your hands often with soap and warm water for at least 20 seconds.  If soap and water are not readily available, use an alcohol-based hand sanitizer with at least 60% alcohol.  . If coughing or sneezing, cover your mouth and nose by coughing or sneezing into the elbow areas of your shirt or coat, into a tissue or into your sleeve (not your hands). . Avoid shaking hands with others and consider head nods or verbal greetings only. . Avoid touching your eyes, nose, or mouth with unwashed hands.  . Avoid close contact with people who are sick. . Avoid places or events with large numbers of people in one location, like concerts or sporting events. . Carefully consider travel plans you have or are making. . If you are planning any travel outside or inside the [place], visit the CDC's Travelers' Health webpage for the latest health notices. . If you have some symptoms but not all symptoms, continue to monitor at home and seek medical attention if your symptoms worsen. . If you are having a medical emergency, call 911.  HOME CARE . Only take medications as instructed by your medical team. . Drink plenty of fluids and get plenty of rest. . A steam or ultrasonic humidifier can help if you have congestion.   GET HELP RIGHT AWAY IF: .   You develop worsening fever. . You become short of breath . You cough up blood. . Your symptoms become more severe MAKE SURE YOU   Understand these instructions.  Will watch your condition.  Will get help right away if you are not doing well or get worse.  Your e-visit answers were reviewed by a board certified advanced clinical practitioner to complete your personal care plan.  Depending on the condition, your plan could have included both over the counter or prescription medications.  If there is a problem please reply once you have received a  response from your provider. Your safety is important to us.  If you have drug allergies check your prescription carefully.    You can use MyChart to ask questions about today's visit, request a non-urgent call back, or ask for a work or school excuse for 24 hours related to this e-Visit. If it has been greater than 24 hours you will need to follow up with your provider, or enter a new e-Visit to address those concerns. You will get an e-mail in the next two days asking about your experience.  I hope that your e-visit has been valuable and will speed your recovery. Thank you for using e-visits.    

## 2019-03-06 ENCOUNTER — Ambulatory Visit: Payer: Self-pay | Admitting: *Deleted

## 2019-03-06 NOTE — Telephone Encounter (Signed)
Follow up call from a patient with a recent e-visit. No fever at any time denies SOB/difficulty breathing. Stated her employer said she would need a test in order to return to work at [employer]. No travels to hot zones, no known contact with a positive case person.She is not high risk and does not qualify for testing. Recommended she phone HR for further advice. Reviewed symptoms requiring immediate evaluation. Reviewed personal protection means to stay healthy.

## 2019-03-06 NOTE — Telephone Encounter (Signed)
  Reason for Disposition . [1] No COVID-19 EXPOSURE BUT [2] questions about  Answer Assessment - Initial Assessment Questions 1. CONFIRMED CASE: "Who is the person with the confirmed COVID-19 infection that you were exposed to?"    No known 2. PLACE of CONTACT: "Where were you when you were exposed to COVID-19  (coronavirus disease 2019)?" (e.g., city, state, country)   Possibly at church in [place] 3. TYPE of CONTACT: "How much contact was there?" (e.g., live in same house, work in same office, same school)    social 4. DATE of CONTACT: "When did you have contact with a coronavirus patient?" (e.g., days)     8 days ago 5. DURATION of CONTACT: "How long were you in contact with the COVID-19 (coronavirus disease) patient?" (e.g., a few seconds, passed by person, a few minutes, live with the patient)    No known contact 6. SYMPTOMS: "Do you have any symptoms?" (e.g., fever, cough, breathing difficulty)    None at this time 7. PREGNANCY OR POSTPARTUM: "Is there any chance you are pregnant?" "When was your last menstrual period?" "Did you deliver in the last 2 weeks?"    no 8. HIGH RISK: "Do you have any heart or lung problems? Do you have a weakened immune system?" (e.g., CHF, COPD, asthma, HIV positive, chemotherapy, renal failure, diabetes mellitus, sickle cell anemia)    no  Protocols used: CORONAVIRUS (COVID-19) EXPOSURE-A-AH

## 2019-03-12 IMAGING — CR DG ELBOW COMPLETE 3+V*R*
1 series · 4 of 4 positions shown · non-contrast
Comparison: None.

CLINICAL DATA: Throbbing pain to the posterior elbow

EXAM:
RIGHT ELBOW - COMPLETE 3+ VIEW

[Series 1: dg elbow complete right (3+view) · 0.14mm/px · 4 of 4 slices shown]
[im 1/4]
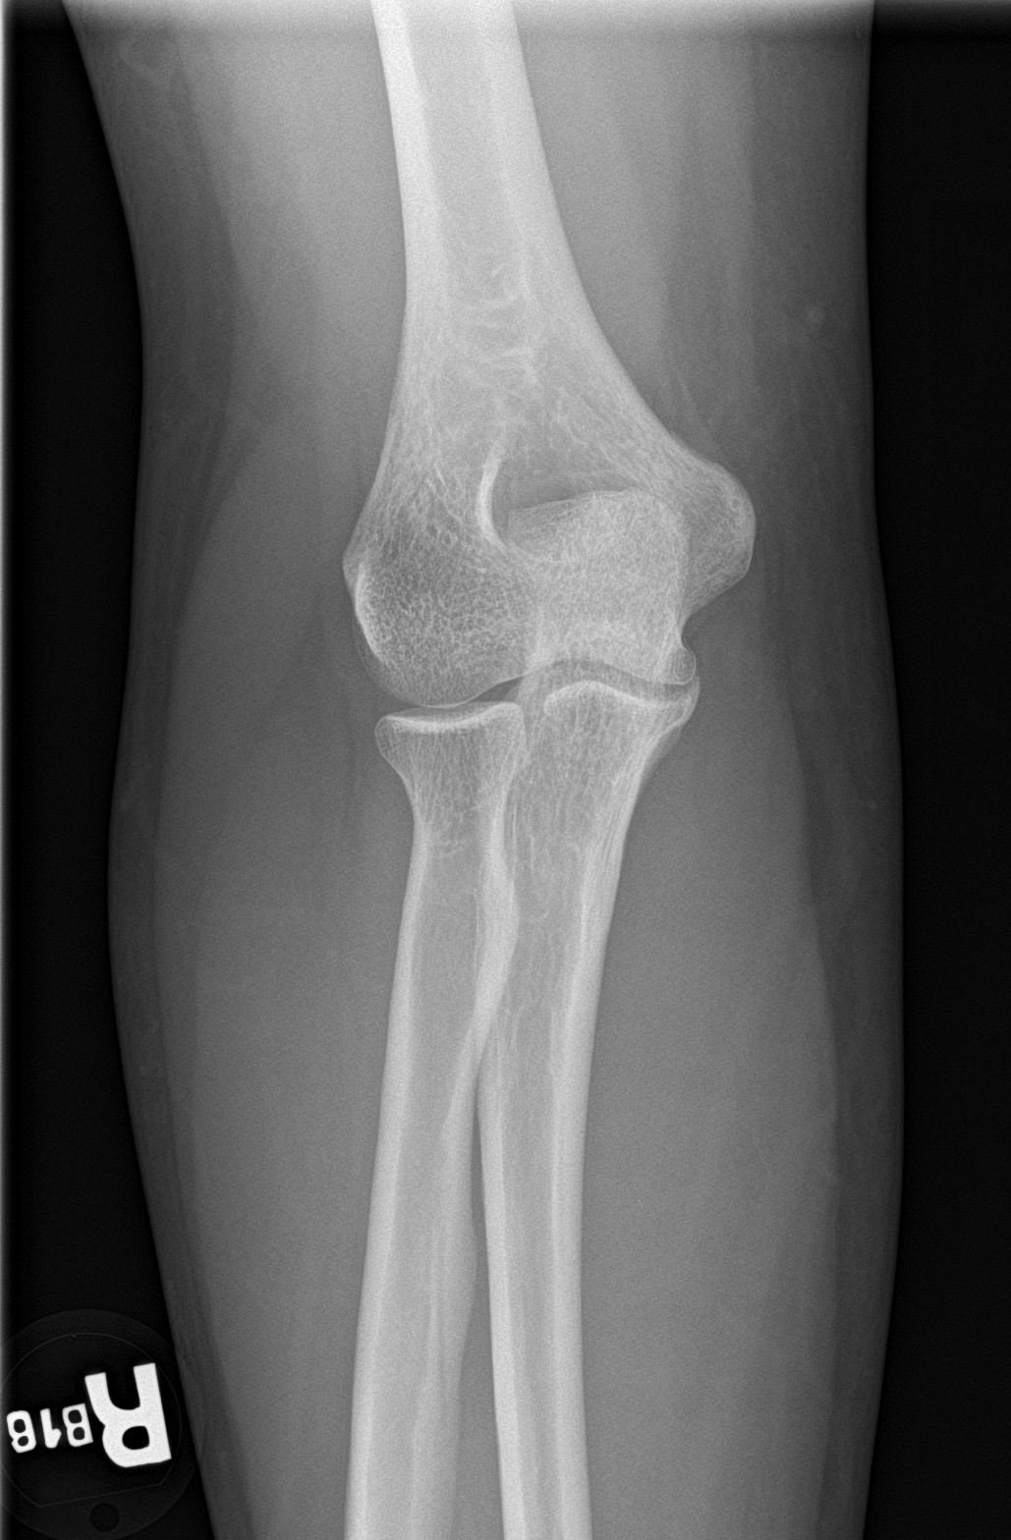
[im 2/4]
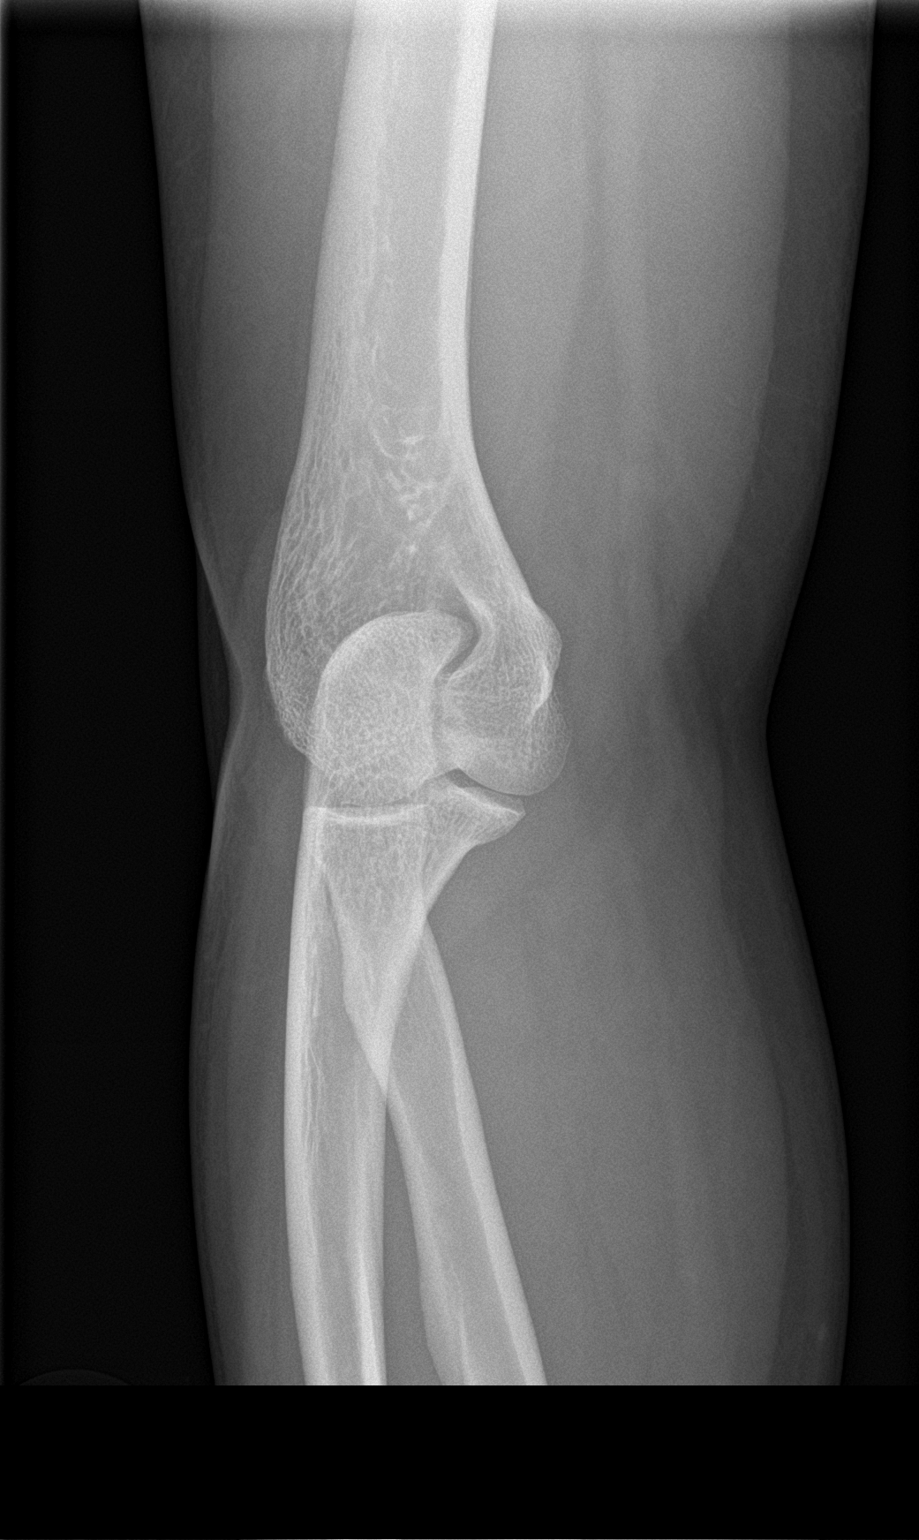
[im 3/4]
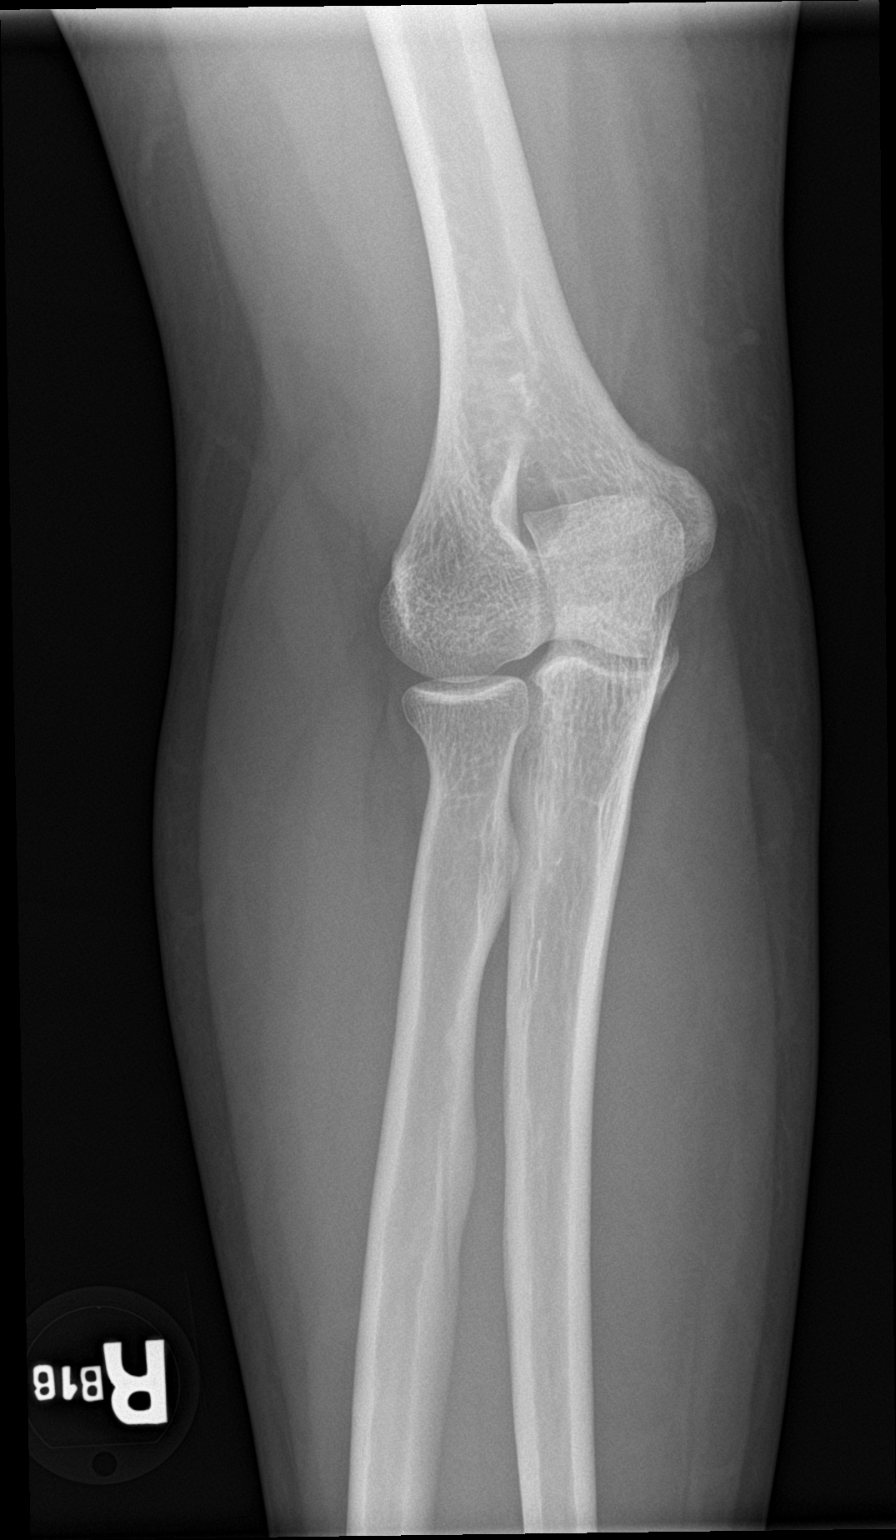
[im 4/4]
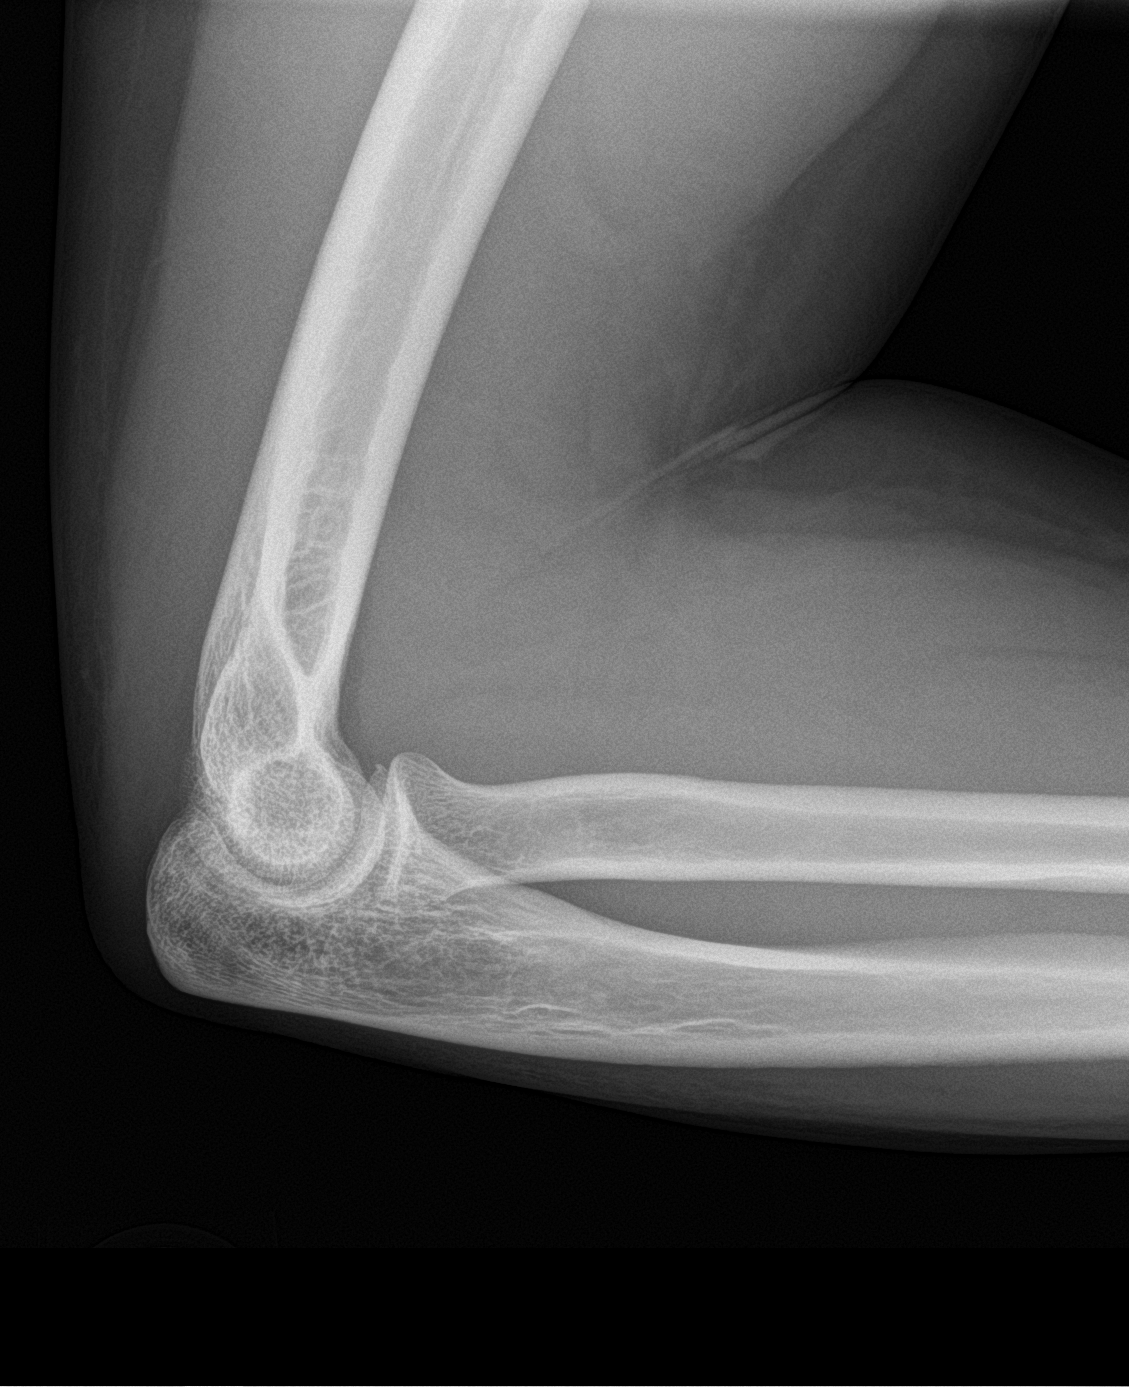

[4 of 4 positions shown; findings below may reference images not displayed]

FINDINGS: There is no evidence of fracture, dislocation, or joint effusion.
There is no evidence of arthropathy or other focal bone abnormality.
Soft tissues are unremarkable.
IMPRESSION: Negative.

## 2019-06-01 ENCOUNTER — Telehealth: Payer: Self-pay | Admitting: Family Medicine

## 2019-06-01 ENCOUNTER — Encounter: Payer: BLUE CROSS/BLUE SHIELD | Admitting: Family Medicine

## 2019-06-01 DIAGNOSIS — G43001 Migraine without aura, not intractable, with status migrainosus: Secondary | ICD-10-CM

## 2019-06-01 MED ORDER — KETOROLAC TROMETHAMINE 10 MG PO TABS: 10.0000 mg | ORAL_TABLET | Freq: Three times a day (TID) | ORAL | 0 refills | Status: DC | PRN

## 2019-06-01 NOTE — Telephone Encounter (Signed)
Patient came in today for CPE, however she has had recent reported exposure to COVID-19 in the workplace, so we ask that she reschedule for 14 days from her exposure to ensure adequate time has passed to monitor for symptoms.  She does request toradol tablets for headaches, which I will provide as she has done well with these in the past.

## 2019-06-20 ENCOUNTER — Encounter: Payer: BLUE CROSS/BLUE SHIELD | Admitting: Family Medicine

## 2019-07-07 ENCOUNTER — Encounter: Payer: Self-pay | Admitting: Emergency Medicine

## 2019-07-07 ENCOUNTER — Emergency Department
Admission: EM | Admit: 2019-07-07 | Discharge: 2019-07-07 | Disposition: A | Discharge status: Home / Self Care | Payer: BLUE CROSS/BLUE SHIELD | Attending: Emergency Medicine | Admitting: Emergency Medicine

## 2019-07-07 DIAGNOSIS — M5442 Lumbago with sciatica, left side: Secondary | ICD-10-CM | POA: Insufficient documentation

## 2019-07-07 DIAGNOSIS — I10 Essential (primary) hypertension: Secondary | ICD-10-CM | POA: Insufficient documentation

## 2019-07-07 DIAGNOSIS — F17210 Nicotine dependence, cigarettes, uncomplicated: Secondary | ICD-10-CM | POA: Diagnosis not present

## 2019-07-07 DIAGNOSIS — Z79899 Other long term (current) drug therapy: Secondary | ICD-10-CM | POA: Insufficient documentation

## 2019-07-07 DIAGNOSIS — E039 Hypothyroidism, unspecified: Secondary | ICD-10-CM | POA: Insufficient documentation

## 2019-07-07 DIAGNOSIS — R1032 Left lower quadrant pain: Secondary | ICD-10-CM | POA: Diagnosis present

## 2019-07-07 LAB — URINALYSIS, COMPLETE (UACMP) WITH MICROSCOPIC
Bilirubin Urine: NEGATIVE
Glucose, UA: NEGATIVE mg/dL
Hgb urine dipstick: NEGATIVE
Ketones, ur: NEGATIVE mg/dL
Leukocytes,Ua: NEGATIVE
Nitrite: NEGATIVE
Protein, ur: NEGATIVE mg/dL
Specific Gravity, Urine: 1.006 (ref 1.005–1.030)
pH: 5 (ref 5.0–8.0)

## 2019-07-07 LAB — POCT PREGNANCY, URINE: Preg Test, Ur: NEGATIVE

## 2019-07-07 MED ORDER — NAPROXEN 500 MG PO TBEC: 500.0000 mg | DELAYED_RELEASE_TABLET | Freq: Two times a day (BID) | ORAL | 0 refills | Status: DC

## 2019-07-07 MED ORDER — KETOROLAC TROMETHAMINE 30 MG/ML IJ SOLN
30.0000 mg | Freq: Once | INTRAMUSCULAR | Status: AC
  Administered 2019-07-07: 30 mg via INTRAMUSCULAR
  Filled 2019-07-07: qty 1

## 2019-07-07 MED ORDER — CYCLOBENZAPRINE HCL 5 MG PO TABS: 5.0000 mg | ORAL_TABLET | Freq: Three times a day (TID) | ORAL | 0 refills | Status: DC | PRN

## 2019-07-07 NOTE — Discharge Instructions (Addendum)
Your exam is consistent with low back muscle strain with some leg muscle cramping. Take the prescription meds as directed. Follow-up with your provider or return to the ED as needed.

## 2019-07-07 NOTE — ED Triage Notes (Signed)
Pt to ED with c/o of left lower back pain and left leg pain.

## 2019-07-07 NOTE — ED Provider Notes (Signed)
[facility] Emergency Department Provider Note ____________________________________________  Time seen: 1710  I have reviewed the triage vital signs and the nursing notes.  HISTORY  Chief Complaint  Back Pain  HPI Lynn Wilson is a 38 y.o. female presents herself to the ED for evaluation of back pain.  She describes onset of her symptoms 2 days prior, she describes pain to the left flank region with some referral to the top of the buttocks.  She also reports some left calf muscle spasm and tightness.  She denies any recent injury, trauma, or fall.  She also denies any history of chronic or ongoing back pain.  She denies any bladder or bowel incontinence, foot drop, saddle anesthesia, or distal paresthesias.  She describes discomfort to the left low back that is sometimes intensified by her need to urinate, and alleviated by emptying her bladder.  She denies any abdominal pain, constipation, diarrhea, or melena stools.  She is taking ibuprofen in the interim for symptom relief and reports some improvement.  Past Medical History:  Diagnosis Date  . GERD (gastroesophageal reflux disease)   . Hypertension   . Hypothyroid   . Hypothyroid 01/25/2018  . Iron deficiency anemia due to chronic blood loss 02/16/2018    Patient Active Problem List   Diagnosis Date Noted  . Chronic left shoulder pain 02/22/2019  . Chronic gastritis without bleeding 02/22/2019  . Menorrhagia with regular cycle 05/10/2018  . Internal bleeding hemorrhoids 05/10/2018  . Migraine without aura and responsive to treatment 02/16/2018  . Iron deficiency anemia due to chronic blood loss 02/16/2018  . Hyperglycemia 01/29/2018  . Metabolic syndrome 82/99/3716  . Hypertension 01/25/2018  . GERD (gastroesophageal reflux disease) 01/25/2018  . Hypothyroid 01/25/2018  . Morbid obesity ([place]) 01/25/2018    Past Surgical History:  Procedure Laterality Date  . COLONOSCOPY WITH PROPOFOL N/A  09/08/2018   Procedure: COLONOSCOPY WITH PROPOFOL;  Surgeon: Lin Landsman, MD;  Location: [facility] ENDOSCOPY;  Service: Gastroenterology;  Laterality: N/A;  . ESOPHAGOGASTRODUODENOSCOPY (EGD) WITH PROPOFOL N/A 09/08/2018   Procedure: ESOPHAGOGASTRODUODENOSCOPY (EGD) WITH PROPOFOL;  Surgeon: Lin Landsman, MD;  Location: [facility] ENDOSCOPY;  Service: Gastroenterology;  Laterality: N/A;    Prior to Admission medications   Medication Sig Start Date End Date Taking? Authorizing Provider  amLODipine (NORVASC) 5 MG tablet Take 1 tablet (5 mg total) by mouth daily. 02/22/19   Steele Sizer, MD  baclofen (LIORESAL) 20 MG tablet Take 1 tablet (20 mg total) by mouth 3 (three) times daily. Prn migraine 02/22/19   Sowles, Drue Stager, MD  benzonatate (TESSALON PERLES) 100 MG capsule Take 1 capsule (100 mg total) by mouth 3 (three) times daily as needed. 03/03/19   Evelina Dun A, FNP  Cetirizine HCl 10 MG CAPS Take 1 capsule (10 mg total) by mouth daily. 03/27/18   Triplett, Johnette Abraham B, FNP  cholecalciferol (VITAMIN D) 1000 units tablet Take 2,000 Units by mouth daily.    [provider]  cyclobenzaprine (FLEXERIL) 5 MG tablet Take 1 tablet (5 mg total) by mouth 3 (three) times daily as needed. 07/07/19   Demetrious Rainford, Dannielle Karvonen, PA-C  dicyclomine (BENTYL) 20 MG tablet Take 1 tablet (20 mg total) by mouth 4 (four) times daily -  before meals and at bedtime. 02/22/19   Steele Sizer, MD  hydrochlorothiazide (HYDRODIURIL) 12.5 MG tablet Take 1 tablet (12.5 mg total) by mouth daily. 02/22/19   Steele Sizer, MD  ketorolac (TORADOL) 10 MG tablet Take 1 tablet (10 mg  total) by mouth every 8 (eight) hours as needed (migraine). 06/01/19   Hubbard Hartshorn, FNP  levothyroxine (SYNTHROID, LEVOTHROID) 100 MCG tablet Take 1 tablet (100 mcg total) by mouth every morning. 02/22/19   Steele Sizer, MD  medroxyPROGESTERone (DEPO-PROVERA) 150 MG/ML injection Inject 1 mL (150 mg total) into the muscle every 3 (three)  months. 05/10/18   Steele Sizer, MD  naproxen (EC NAPROSYN) 500 MG EC tablet Take 1 tablet (500 mg total) by mouth 2 (two) times daily with a meal for 15 days. 07/07/19 07/22/19  Sadler Teschner, Dannielle Karvonen, PA-C  pantoprazole (PROTONIX) 40 MG tablet Take 1 tablet (40 mg total) by mouth 2 (two) times daily before a meal. 02/22/19   Ancil Boozer, Drue Stager, MD  SUMAtriptan (IMITREX) 100 MG tablet Take 1 tablet (100 mg total) by mouth every 2 (two) hours as needed for migraine. May repeat in 2 hours if headache persists or recurs. 02/22/19   Steele Sizer, MD    Allergies Celebrex [celecoxib], Sesame seed (diagnostic), and Lisinopril  Family History  Problem Relation Age of Onset  . Hypertension Mother   . Hypertension Father   . Stroke Father     Social History Social History   Tobacco Use  . Smoking status: Current Every Day Smoker    Packs/day: 0.50    Years: 19.00    Pack years: 9.50    Types: Cigarettes    Start date: 01/26/2000  . Smokeless tobacco: Never Used  . Tobacco comment: contemplating   Substance Use Topics  . Alcohol use: Yes    Alcohol/week: 2.0 standard drinks    Types: 2 Cans of beer per week  . Drug use: No    Review of Systems  Constitutional: Negative for fever. Eyes: Negative for visual changes. ENT: Negative for sore throat. Cardiovascular: Negative for chest pain. Respiratory: Negative for shortness of breath. Gastrointestinal: Negative for abdominal pain, vomiting and diarrhea. Genitourinary: Negative for dysuria. Musculoskeletal: Positive for back pain. Skin: Negative for rash. Neurological: Negative for headaches, focal weakness or numbness. ____________________________________________  PHYSICAL EXAM:  VITAL SIGNS: ED Triage Vitals  Enc Vitals Group     BP 07/07/19 1540 (!) 158/89     Pulse Rate 07/07/19 1540 76     Resp 07/07/19 1540 16     Temp 07/07/19 1540 98.9 F (37.2 C)     Temp Source 07/07/19 1540 Oral     SpO2 07/07/19 1540 99 %      Weight 07/07/19 1541 239 lb (108.4 kg)     Height 07/07/19 1541 5\' 4"  (1.626 m)     Head Circumference --      Peak Flow --      Pain Score 07/07/19 1548 8     Pain Loc --      Pain Edu? --      Excl. in [place]? --     Constitutional: Alert and oriented. Well appearing and in no distress. Head: Normocephalic and atraumatic. Eyes: Conjunctivae are normal. Normal extraocular movements Cardiovascular: Normal rate, regular rhythm. Normal distal pulses. Respiratory: Normal respiratory effort. No wheezes/rales/rhonchi. Gastrointestinal: Soft and nontender. No distention.  No CVA tenderness on palpation. Musculoskeletal: Normal spinal alignment without midline tenderness, spasm, deformity, or step-off.  Patient transitions from sit to stand without difficulty.  She is able demonstrate normal full flexion and extension range on exam.  Normal hip flexion bilaterally is appreciated.  Normal toe and heel raise on exam.  Nontender with normal range of motion in all extremities.  Neurologic: Cranial nerves II through XII grossly intact.   Normal gait without ataxia. Normal speech and language. No gross focal neurologic deficits are appreciated. Skin:  Skin is warm, dry and intact. No rash noted. Psychiatric: Mood and affect are normal. Patient exhibits appropriate insight and judgment. ____________________________________________   LABS (pertinent positives/negatives) Labs Reviewed  URINALYSIS, COMPLETE (UACMP) WITH MICROSCOPIC - Abnormal; Notable for the following components:      Result Value   Color, Urine STRAW (*)    APPearance CLEAR (*)    Bacteria, UA RARE (*)    All other components within normal limits  POCT PREGNANCY, URINE  POC URINE PREG, ED  ___________________________________________   RADIOLOGY  Not indicated ____________________________________________  PROCEDURES  Procedures Toradol 30 mg IM ____________________________________________  INITIAL IMPRESSION / ASSESSMENT  AND PLAN / ED COURSE  Lynn Wilson was evaluated in Emergency Department on 07/07/2019 for the symptoms described in the history of present illness. She was evaluated in the context of the global COVID-19 pandemic, which necessitated consideration that the patient might be at risk for infection with the SARS-CoV-2 virus that causes COVID-19. Institutional protocols and algorithms that pertain to the evaluation of patients at risk for COVID-19 are in a state of rapid change based on information released by regulatory bodies including the CDC and federal and state organizations. These policies and algorithms were followed during the patient's care in the ED.  Patient with ED evaluation of left-sided low back pain with some left lower extremity muscle cramping.  Patient's exam is overall benign and reassuring at this time.  No indication of any acute neuromuscular deficit or acute pyelonephritis.  Her exam is without any indication of acute neuropathic or traumatic etiology.  Patient will be discharged with prescriptions for enteric-coated Naprosyn and Flexeril to take as directed.  She will follow-up with primary provider for ongoing symptoms or return to the ED as necessary.  A work note is provided releasing her to return to work tomorrow as requested. ____________________________________________  FINAL CLINICAL IMPRESSION(S) / ED DIAGNOSES  Final diagnoses:  Acute midline low back pain with left-sided sciatica      Melvenia Needles, PA-C 07/07/19 1728    Arta Silence, MD 07/07/19 2023

## 2019-07-12 ENCOUNTER — Encounter: Payer: Self-pay | Admitting: Nurse Practitioner

## 2019-07-12 ENCOUNTER — Other Ambulatory Visit: Payer: Self-pay

## 2019-07-12 ENCOUNTER — Ambulatory Visit (INDEPENDENT_AMBULATORY_CARE_PROVIDER_SITE_OTHER): Payer: BLUE CROSS/BLUE SHIELD | Admitting: Nurse Practitioner

## 2019-07-12 ENCOUNTER — Other Ambulatory Visit (HOSPITAL_COMMUNITY)
Admission: RE | Admit: 2019-07-12 | Discharge: 2019-07-12 | Disposition: A | Discharge status: Home / Self Care | Payer: BLUE CROSS/BLUE SHIELD | Source: Ambulatory Visit | Attending: Nurse Practitioner | Admitting: Nurse Practitioner

## 2019-07-12 VITALS — BP 128/82 | HR 97 | Temp 96.9°F | Resp 14 | Ht 65.0 in | Wt 240.2 lb

## 2019-07-12 DIAGNOSIS — Z13220 Encounter for screening for lipoid disorders: Secondary | ICD-10-CM | POA: Diagnosis not present

## 2019-07-12 DIAGNOSIS — Z130 Encounter for screening for diseases of the blood and blood-forming organs and certain disorders involving the immune mechanism: Secondary | ICD-10-CM | POA: Diagnosis not present

## 2019-07-12 DIAGNOSIS — Z131 Encounter for screening for diabetes mellitus: Secondary | ICD-10-CM | POA: Diagnosis not present

## 2019-07-12 DIAGNOSIS — Z0000 Encounter for general adult medical examination without abnormal findings: Secondary | ICD-10-CM

## 2019-07-12 DIAGNOSIS — M25512 Pain in left shoulder: Secondary | ICD-10-CM

## 2019-07-12 DIAGNOSIS — E039 Hypothyroidism, unspecified: Secondary | ICD-10-CM

## 2019-07-12 DIAGNOSIS — M5416 Radiculopathy, lumbar region: Secondary | ICD-10-CM

## 2019-07-12 DIAGNOSIS — N941 Unspecified dyspareunia: Secondary | ICD-10-CM

## 2019-07-12 DIAGNOSIS — G8929 Other chronic pain: Secondary | ICD-10-CM

## 2019-07-12 DIAGNOSIS — Z113 Encounter for screening for infections with a predominantly sexual mode of transmission: Secondary | ICD-10-CM | POA: Diagnosis not present

## 2019-07-12 MED ORDER — NAPROXEN 500 MG PO TABS: 500.0000 mg | ORAL_TABLET | Freq: Two times a day (BID) | ORAL | 0 refills | Status: DC

## 2019-07-12 NOTE — Patient Instructions (Addendum)
General recommendations: 150 minutes of physical activity weekly, eat two servings of fish weekly, eat one serving of tree nuts ( cashews, pistachios, pecans, almonds.Marland Kitchen) every other day, eat 6 servings of fruit/vegetables daily and drink plenty of water and avoid sweet beverages. Recommend at least 64 ounces of water daily.    Take naproxen as prescribed with full meal. Do not take additional NSAIDs with it like ibuprofen can take acetaminophen for additional pain control.  Try to use exercise bike 30 minutes 3 times a week- with the goal of doing it daily.   Steps to Quit Smoking Smoking tobacco is the leading cause of preventable death. It can affect almost every organ in the body. Smoking puts you and those around you at risk for developing many serious chronic diseases. Quitting smoking can be difficult, but it is one of the best things that you can do for your health. It is never too late to quit. How do I get ready to quit? When you decide to quit smoking, create a plan to help you succeed. Before you quit:  Pick a date to quit. Set a date within the next 2 weeks to give you time to prepare.  Write down the reasons why you are quitting. Keep this list in places where you will see it often.  Tell your family, friends, and co-workers that you are quitting. Support from your loved ones can make quitting easier.  Talk with your health care provider about your options for quitting smoking.  Find out what treatment options are covered by your health insurance.  Identify people, places, things, and activities that make you want to smoke (triggers). Avoid them. What first steps can I take to quit smoking?  Throw away all cigarettes at home, at work, and in your car.  Throw away smoking accessories, such as Scientist, research (medical).  Clean your car. Make sure to empty the ashtray.  Clean your home, including curtains and carpets. What strategies can I use to quit smoking? Talk with your  health care provider about combining strategies, such as taking medicines while you are also receiving in-person counseling. Using these two strategies together makes you more likely to succeed in quitting than if you used either strategy on its own.  If you are pregnant or breastfeeding, talk with your health care provider about finding counseling or other support strategies to quit smoking. Do not take medicine to help you quit smoking unless your health care provider tells you to do so. To quit smoking: Quit right away  Quit smoking completely, instead of gradually reducing how much you smoke over a period of time. Research shows that stopping smoking right away is more successful than gradually quitting.  Attend in-person counseling to help you build problem-solving skills. You are more likely to succeed in quitting if you attend counseling sessions regularly. Even short sessions of 10 minutes can be effective. Take medicine You may take medicines to help you quit smoking. Some medicines require a prescription and some you can purchase over-the-counter. Medicines may have nicotine in them to replace the nicotine in cigarettes. Medicines may:  Help to stop cravings.  Help to relieve withdrawal symptoms. Your health care provider may recommend:  Nicotine patches, gum, or lozenges.  Nicotine inhalers or sprays.  Non-nicotine medicine that is taken by mouth. Find resources Find resources and support systems that can help you to quit smoking and remain smoke-free after you quit. These resources are most helpful when you use them often.  They include:  Online chats with a Social worker.  Telephone quitlines.  Printed Furniture conservator/restorer.  Support groups or group counseling.  Text messaging programs.  Mobile phone apps or applications. Use apps that can help you stick to your quit plan by providing reminders, tips, and encouragement. There are many free apps for mobile devices as well as  websites. Examples include Quit Guide from the [employer] and smokefree.gov What things can I do to make it easier to quit?  Reach out to your family and friends for support and encouragement. Call telephone quitlines (1-800-QUIT-NOW), reach out to support groups, or work with a counselor for support.  Ask people who smoke to avoid smoking around you.  Avoid places that trigger you to smoke, such as bars, parties, or smoke-break areas at work.  Spend time with people who do not smoke.  Lessen the stress in your life. Stress can be a smoking trigger for some people. To lessen stress, try: ? Exercising regularly. ? Doing deep-breathing exercises. ? Doing yoga. ? Meditating. ? Performing a body scan. This involves closing your eyes, scanning your body from head to toe, and noticing which parts of your body are particularly tense. Try to relax the muscles in those areas. How will I feel when I quit smoking? Day 1 to 3 weeks Within the first 24 hours of quitting smoking, you may start to feel withdrawal symptoms. These symptoms are usually most noticeable 2-3 days after quitting, but they usually do not last for more than 2-3 weeks. You may experience these symptoms:  Mood swings.  Restlessness, anxiety, or irritability.  Trouble concentrating.  Dizziness.  Strong cravings for sugary foods and nicotine.  Mild weight gain.  Constipation.  Nausea.  Coughing or a sore throat.  Changes in how the medicines that you take for unrelated issues work in your body.  Depression.  Trouble sleeping (insomnia). Week 3 and afterward After the first 2-3 weeks of quitting, you may start to notice more positive results, such as:  Improved sense of smell and taste.  Decreased coughing and sore throat.  Slower heart rate.  Lower blood pressure.  Clearer skin.  The ability to breathe more easily.  Fewer sick days. Quitting smoking can be very challenging. Do not get discouraged if you are  not successful the first time. Some people need to make many attempts to quit before they achieve long-term success. Do your best to stick to your quit plan, and talk with your health care provider if you have any questions or concerns. Summary  Smoking tobacco is the leading cause of preventable death. Quitting smoking is one of the best things that you can do for your health.  When you decide to quit smoking, create a plan to help you succeed.  Quit smoking right away, not slowly over a period of time.  When you start quitting, seek help from your health care provider, family, or friends. This information is not intended to replace advice given to you by your health care provider. Make sure you discuss any questions you have with your health care provider. Document Released: 11/24/2001 Document Revised: 02/17/2019 Document Reviewed: 02/18/2019 Elsevier Patient Education  [place].  Stay Safe in the [place] The majority of sun exposure occurs before age 8 and skin cancer can take 20 years or more to develop. Whether your sun bathing days are behind you or you still spend time pursuing the perfect tan, you should be concerned about skin cancer.  Remember, the  sun's ultraviolet (UV) rays can reflect off water, sand, concrete and snow, and can reach below the water's surface. Certain types of UV light penetrate fog and clouds, so it's possible to get sunburn even on overcast days.  Avoid direct sunlight as much as possible during the peak sun hours, generally 10 a.m. to 3 p.m., or seek shade during this part of day. Wear broad-spectrum sunscreen - with an SPF of at least 30 - containing both UVA and UVB protection. Look for ingredients like [employer] (also known as avobenzone) or titanium dioxide on the label. Reapply sunscreen frequently, at least every two hours when outdoors, especially if you perspire or you've been swimming. Your best bet is to choose water-resistant products that  are more likely to stay on your skin. Wear lip balm with an SPF 15 or higher. Wear a hat and other protective clothing while in the sun. Tightly woven fibers and darker clothing generally provide more protection. Also, look for products approved by the American Academy of Dermatology. Wear UV-protective sunglasses.

## 2019-07-12 NOTE — Progress Notes (Signed)
Name: Lynn Wilson   MRN: [id number]    DOB: Oct 18, 1981   Date:07/12/2019       Progress Note  Subjective  Chief Complaint  Chief Complaint  Patient presents with  . Annual Exam    HPI  Patient presents for annual CPE. Chronic left shoulder pain ongoing for 2-3 years has worsened in the last few months and has decreased ROM.  Was at [facility] ER last week for acute lower back pain, unable to afford naproxen EC will try regular.   Diet:  Eats a lot late at night Has been eating a lot more carbs and breads Gets a vegetables serving every day or other day; fruits once or twice a week.  Likes to drink water and juice . Exercise:  Active at work. Bought an exercise bike but hasnt used it.   USPSTF grade A and B recommendations    Office Visit from 07/12/2019 in [facility]  AUDIT-C Score  1     Depression: Phq 9 is  negative Depression screen [facility] 2/9 07/12/2019 02/22/2019 11/16/2018 07/11/2018 05/10/2018  Decreased Interest 0 0 0 0 0  Down, Depressed, Hopeless 0 0 0 0 0  PHQ - 2 Score 0 0 0 0 0  Altered sleeping 0 3 2 - 1  Tired, decreased energy 0 2 0 - 0  Change in appetite 0 3 0 - 0  Feeling bad or failure about yourself  0 0 0 - 0  Trouble concentrating 0 0 0 - 0  Moving slowly or fidgety/restless 0 0 0 - 0  Suicidal thoughts 0 0 0 - 0  PHQ-9 Score 0 8 2 - 1  Difficult doing work/chores Not difficult at all Somewhat difficult Not difficult at all - Not difficult at all   Hypertension: BP Readings from Last 3 Encounters:  07/12/19 128/82  07/07/19 (!) 158/89  02/22/19 140/90   Obesity: Wt Readings from Last 3 Encounters:  07/12/19 240 lb 3.2 oz (109 kg)  07/07/19 239 lb (108.4 kg)  02/22/19 240 lb 4.8 oz (109 kg)   BMI Readings from Last 3 Encounters:  07/12/19 39.97 kg/m  07/07/19 41.02 kg/m  02/22/19 41.25 kg/m    Hep C Screening: ordered STD testing and prevention (HIV/chl/gon/syphilis): ordered Intimate partner violence: denies   Sexual History/Pain during Intercourse: married ; endorses pain with orgasm the last few months. - has fibroid  Menstrual History/LMP/Abnormal Bleeding: has heavy periods so she was started on depo but has never started on it.     Breast cancer: discussed; had maternal great aunt had breast cancer. Denies breast tenderness pain, nipple discharge  No results found for: [facility]  Cervical cancer screening: due 2022  Lipids:  Lab Results  Component Value Date   CHOL 160 01/26/2018   Lab Results  Component Value Date   HDL 48 (L) 01/26/2018   Lab Results  Component Value Date   LDLCALC 97 01/26/2018   Lab Results  Component Value Date   TRIG 68 01/26/2018   Lab Results  Component Value Date   CHOLHDL 3.3 01/26/2018   No results found for: LDLDIRECT  Glucose:  Glucose, Bld  Date Value Ref Range Status  12/07/2018 140 (H) 70 - 99 mg/dL Final  11/16/2018 100 (H) 65 - 99 mg/dL Final    Comment:    .            Fasting reference interval . For someone without known diabetes, a glucose value between 100  and 125 mg/dL is consistent with prediabetes and should be confirmed with a follow-up test. .   07/05/2018 95 70 - 99 mg/dL Final    Skin cancer: discussed Colorectal cancer: discussed  Lung cancer:   Low Dose CT Chest recommended if Age 71-80 years, 30 pack-year currently smoking OR have quit w/in 15years. Patient does not qualify.   Patient Active Problem List   Diagnosis Date Noted  . Chronic left shoulder pain 02/22/2019  . Chronic gastritis without bleeding 02/22/2019  . Menorrhagia with regular cycle 05/10/2018  . Internal bleeding hemorrhoids 05/10/2018  . Migraine without aura and responsive to treatment 02/16/2018  . Iron deficiency anemia due to chronic blood loss 02/16/2018  . Hyperglycemia 01/29/2018  . Metabolic syndrome 78/46/9629  . Hypertension 01/25/2018  . GERD (gastroesophageal reflux disease) 01/25/2018  . Hypothyroid 01/25/2018  . Morbid  obesity ([place]) 01/25/2018    Past Surgical History:  Procedure Laterality Date  . COLONOSCOPY WITH PROPOFOL N/A 09/08/2018   Procedure: COLONOSCOPY WITH PROPOFOL;  Surgeon: Lin Landsman, MD;  Location: [facility] ENDOSCOPY;  Service: Gastroenterology;  Laterality: N/A;  . ESOPHAGOGASTRODUODENOSCOPY (EGD) WITH PROPOFOL N/A 09/08/2018   Procedure: ESOPHAGOGASTRODUODENOSCOPY (EGD) WITH PROPOFOL;  Surgeon: Lin Landsman, MD;  Location: [facility] ENDOSCOPY;  Service: Gastroenterology;  Laterality: N/A;    Family History  Problem Relation Age of Onset  . Hypertension Mother   . Hypertension Father   . Stroke Father     Social History   Socioeconomic History  . Marital status: Married    Spouse name: Windy Canny  . Number of children: 0  . Years of education: Not on file  . Highest education level: Associate degree: occupational, Hotel manager, or vocational program  Occupational History  . Occupation: Chiropractor: [employer]  . Financial resource strain: Somewhat hard  . Food insecurity    Worry: Sometimes true    Inability: Sometimes true  . Transportation needs    Medical: No    Non-medical: No  Tobacco Use  . Smoking status: Current Every Day Smoker    Packs/day: 0.50    Years: 19.00    Pack years: 9.50    Types: Cigarettes    Start date: 01/26/2000  . Smokeless tobacco: Never Used  . Tobacco comment: contemplating   Substance and Sexual Activity  . Alcohol use: Yes    Alcohol/week: 2.0 standard drinks    Types: 2 Cans of beer per week  . Drug use: No  . Sexual activity: Yes    Birth control/protection: Other-see comments    Comment: homosexual   Lifestyle  . Physical activity    Days per week: 0 days    Minutes per session: 0 min  . Stress: Only a little  Relationships  . Social connections    Talks on phone: More than three times a week    Gets together: Once a week    Attends religious service: More than 4 times per year    Active  member of club or organization: No    Attends meetings of clubs or organizations: Never    Relationship status: Living with partner  . Intimate partner violence    Fear of current or ex partner: No    Emotionally abused: No    Physically abused: No    Forced sexual activity: No  Other Topics Concern  . Not on file  Social History Narrative   She moved to [place] from [place], [place] in the  Summer of 2018   She lives with her wife ( married since 02/19/2018 - homosexual    She does not have any children, but girlfriend has an adult daughter    She is now working at [employer]     Current Outpatient Medications:  .  amLODipine (NORVASC) 5 MG tablet, Take 1 tablet (5 mg total) by mouth daily., Disp: 90 tablet, Rfl: 1 .  baclofen (LIORESAL) 20 MG tablet, Take 1 tablet (20 mg total) by mouth 3 (three) times daily. Prn migraine, Disp: 30 each, Rfl: 0 .  Cetirizine HCl 10 MG CAPS, Take 1 capsule (10 mg total) by mouth daily., Disp: 30 capsule, Rfl: 0 .  cholecalciferol (VITAMIN D) 1000 units tablet, Take 2,000 Units by mouth daily., Disp: , Rfl:  .  cyclobenzaprine (FLEXERIL) 5 MG tablet, Take 1 tablet (5 mg total) by mouth 3 (three) times daily as needed., Disp: 15 tablet, Rfl: 0 .  dicyclomine (BENTYL) 20 MG tablet, Take 1 tablet (20 mg total) by mouth 4 (four) times daily -  before meals and at bedtime., Disp: 40 tablet, Rfl: 0 .  hydrochlorothiazide (HYDRODIURIL) 12.5 MG tablet, Take 1 tablet (12.5 mg total) by mouth daily., Disp: 90 tablet, Rfl: 1 .  levothyroxine (SYNTHROID, LEVOTHROID) 100 MCG tablet, Take 1 tablet (100 mcg total) by mouth every morning., Disp: 90 tablet, Rfl: 1 .  pantoprazole (PROTONIX) 40 MG tablet, Take 1 tablet (40 mg total) by mouth 2 (two) times daily before a meal., Disp: 180 tablet, Rfl: 1 .  SUMAtriptan (IMITREX) 100 MG tablet, Take 1 tablet (100 mg total) by mouth every 2 (two) hours as needed for migraine. May repeat in 2 hours if headache persists or recurs., Disp:  10 tablet, Rfl: 0 .  medroxyPROGESTERone (DEPO-PROVERA) 150 MG/ML injection, Inject 1 mL (150 mg total) into the muscle every 3 (three) months. (Patient not taking: Reported on 07/12/2019), Disp: 1 mL, Rfl: 3 .  naproxen (NAPROSYN) 500 MG tablet, Take 1 tablet (500 mg total) by mouth 2 (two) times daily with a meal., Disp: 30 tablet, Rfl: 0  Allergies  Allergen Reactions  . Celebrex [Celecoxib]     "shakes/tremors"  . Sesame Seed (Diagnostic) Itching  . Lisinopril Rash     Review of Systems  Constitutional: Negative for chills, fever and malaise/fatigue.  HENT: Negative for congestion, sinus pain and sore throat.   Eyes: Negative for blurred vision.  Respiratory: Negative for cough and shortness of breath.   Cardiovascular: Negative for chest pain, palpitations and leg swelling.  Gastrointestinal: Negative for abdominal pain, blood in stool, constipation, diarrhea and nausea.  Genitourinary: Negative for dysuria and frequency.  Musculoskeletal: Negative for falls and joint pain.  Skin: Negative for rash.  Neurological: Negative for dizziness and headaches.  Endo/Heme/Allergies: Negative for polydipsia.  Psychiatric/Behavioral: The patient is not nervous/anxious and does not have insomnia.       Objective  Vitals:   07/12/19 1042  BP: 128/82  Pulse: 97  Resp: 14  Temp: (!) 96.9 F (36.1 C)  TempSrc: Temporal  SpO2: 99%  Weight: 240 lb 3.2 oz (109 kg)  Height: 5\' 5"  (1.651 m)    Body mass index is 39.97 kg/m.  Physical Exam Constitutional: Patient appears well-developed and well-nourished. No distress.  HENT: Head: Normocephalic and atraumatic. Ears: B TMs ok, no erythema or effusion;  Eyes: Conjunctivae and EOM are normal. Pupils are equal, round, and reactive to light. No scleral icterus.  Neck: Normal range of motion. Neck  supple. No JVD present. No thyromegaly present.  Cardiovascular: Normal rate, regular rhythm and normal heart sounds.  No murmur heard. No BLE  edema. Pulmonary/Chest: Effort normal and breath sounds normal. No respiratory distress. Abdominal: Soft. Bowel sounds are normal, no distension. There is no tenderness. no masses Breast: no lumps or masses, no nipple discharge or rashes Musculoskeletal: Normal range of motion, no joint effusions. No gross deformities Neurological: he is alert and oriented to person, place, and time. No cranial nerve deficit. Coordination, balance, strength, speech and gait are normal.  Skin: Skin is warm and dry. No rash noted. No erythema.  Psychiatric: Patient has a normal mood and affect. behavior is normal. Judgment and thought content normal.     Fall Risk: Fall Risk  07/12/2019 02/22/2019 11/16/2018 07/11/2018 05/10/2018  Falls in the past year? 0 0 0 Yes No  Number falls in past yr: 0 0 0 1 -  Injury with Fall? 0 0 0 Yes -  Comment - - - Right Ankle -    Functional Status Survey: Is the patient deaf or have difficulty hearing?: No Does the patient have difficulty seeing, even when wearing glasses/contacts?: No Does the patient have difficulty concentrating, remembering, or making decisions?: No Does the patient have difficulty walking or climbing stairs?: No Does the patient have difficulty dressing or bathing?: No Does the patient have difficulty doing errands alone such as visiting a doctor's office or shopping?: No   Assessment & Plan  1. Preventative health care  - CBC with Differential - COMPLETE METABOLIC PANEL WITH GFR - Lipid Profile - TSH  2. Screening for deficiency anemia - CBC with Differential  3. Screening for diabetes mellitus  - COMPLETE METABOLIC PANEL WITH GFR  4. Screening cholesterol level - Lipid Profile  5. Hypothyroidism, adult - TSH  6. Lumbar back pain with radiculopathy affecting left lower extremity - naproxen (NAPROSYN) 500 MG tablet; Take 1 tablet (500 mg total) by mouth 2 (two) times daily with a meal.  Dispense: 30 tablet; Refill: 0 - Ambulatory  referral to Physical Therapy  7. Dyspareunia in female Has hx of fibroids, if not improved with naproxen will do further work up   8. Screening for STDs (sexually transmitted diseases) - Hepatitis panel, acute - Cervicovaginal ancillary only - HIV antibody (with reflex) - RPR  9. Chronic left shoulder pain - Ambulatory referral to Physical Therapy  -USPSTF grade A and B recommendations reviewed with patient; age-appropriate recommendations, preventive care, screening tests, etc discussed and encouraged; healthy living encouraged; see AVS for patient education given to patient -Discussed importance of 150 minutes of physical activity weekly, eat two servings of fish weekly, eat one serving of tree nuts ( cashews, pistachios, pecans, almonds.Marland Kitchen) every other day, eat 6 servings of fruit/vegetables daily and drink plenty of water and avoid sweet beverages.

## 2019-07-13 LAB — COMPLETE METABOLIC PANEL WITH GFR
AG Ratio: 1.2 (calc) (ref 1.0–2.5)
ALT: 30 U/L — ABNORMAL HIGH (ref 6–29)
AST: 29 U/L (ref 10–30)
Albumin: 4.2 g/dL (ref 3.6–5.1)
Alkaline phosphatase (APISO): 79 U/L (ref 31–125)
BUN: 8 mg/dL (ref 7–25)
CO2: 24 mmol/L (ref 20–32)
Calcium: 9.2 mg/dL (ref 8.6–10.2)
Chloride: 105 mmol/L (ref 98–110)
Creat: 0.72 mg/dL (ref 0.50–1.10)
GFR, Est African American: 125 mL/min/{1.73_m2} (ref >=60)
GFR, Est Non African American: 108 mL/min/{1.73_m2} (ref >=60)
Globulin: 3.4 g/dL (calc) (ref 1.9–3.7)
Glucose, Bld: 110 mg/dL — ABNORMAL HIGH (ref 65–99)
Potassium: 4 mmol/L (ref 3.5–5.3)
Sodium: 136 mmol/L (ref 135–146)
Total Bilirubin: 0.3 mg/dL (ref 0.2–1.2)
Total Protein: 7.6 g/dL (ref 6.1–8.1)

## 2019-07-13 LAB — CBC WITH DIFFERENTIAL/PLATELET
Absolute Monocytes: 539 cells/uL (ref 200–950)
Basophils Absolute: 21 cells/uL (ref 0–200)
Basophils Relative: 0.3 %
Eosinophils Absolute: 77 cells/uL (ref 15–500)
Eosinophils Relative: 1.1 %
HCT: 39 % (ref 35.0–45.0)
Hemoglobin: 12.6 g/dL (ref 11.7–15.5)
Lymphs Abs: 2513 cells/uL (ref 850–3900)
MCH: 24.9 pg — ABNORMAL LOW (ref 27.0–33.0)
MCHC: 32.3 g/dL (ref 32.0–36.0)
MCV: 77.1 fL — ABNORMAL LOW (ref 80.0–100.0)
MPV: 10.4 fL (ref 7.5–12.5)
Monocytes Relative: 7.7 %
Neutro Abs: 3850 cells/uL (ref 1500–7800)
Neutrophils Relative %: 55 %
Platelets: 388 10*3/uL (ref 140–400)
RBC: 5.06 10*6/uL (ref 3.80–5.10)
RDW: 15.7 % — ABNORMAL HIGH (ref 11.0–15.0)
Total Lymphocyte: 35.9 %
WBC: 7 10*3/uL (ref 3.8–10.8)

## 2019-07-13 LAB — TSH: TSH: 2.88 mIU/L

## 2019-07-13 LAB — LIPID PANEL
Cholesterol: 168 mg/dL (ref <200)
HDL: 48 mg/dL — ABNORMAL LOW (ref >=50)
LDL Cholesterol (Calc): 104 mg/dL (calc) — ABNORMAL HIGH
Non-HDL Cholesterol (Calc): 120 mg/dL (calc) (ref <130)
Total CHOL/HDL Ratio: 3.5 (calc) (ref <5.0)
Triglycerides: 69 mg/dL (ref <150)

## 2019-07-13 LAB — HIV ANTIBODY (ROUTINE TESTING W REFLEX): HIV 1&2 Ab, 4th Generation: NONREACTIVE

## 2019-07-13 LAB — RPR: RPR Ser Ql: NONREACTIVE

## 2019-07-14 LAB — CERVICOVAGINAL ANCILLARY ONLY
Bacterial vaginitis: NEGATIVE
Candida vaginitis: NEGATIVE
Chlamydia: NEGATIVE
Neisseria Gonorrhea: NEGATIVE
Trichomonas: NEGATIVE

## 2019-07-27 ENCOUNTER — Other Ambulatory Visit: Payer: Self-pay | Admitting: Family Medicine

## 2019-07-27 ENCOUNTER — Ambulatory Visit
Admission: RE | Admit: 2019-07-27 | Discharge: 2019-07-27 | Disposition: A | Discharge status: Home / Self Care | Payer: PRIVATE HEALTH INSURANCE | Source: Ambulatory Visit | Attending: Family Medicine | Admitting: Family Medicine

## 2019-07-27 ENCOUNTER — Other Ambulatory Visit: Payer: Self-pay

## 2019-07-27 DIAGNOSIS — M545 Low back pain, unspecified: Secondary | ICD-10-CM

## 2019-07-27 DIAGNOSIS — M5431 Sciatica, right side: Secondary | ICD-10-CM | POA: Insufficient documentation

## 2019-08-01 ENCOUNTER — Ambulatory Visit: Payer: BLUE CROSS/BLUE SHIELD | Attending: Nurse Practitioner

## 2019-08-01 ENCOUNTER — Other Ambulatory Visit: Payer: Self-pay

## 2019-08-01 DIAGNOSIS — M25512 Pain in left shoulder: Secondary | ICD-10-CM | POA: Diagnosis present

## 2019-08-01 DIAGNOSIS — R2689 Other abnormalities of gait and mobility: Secondary | ICD-10-CM

## 2019-08-01 DIAGNOSIS — M25612 Stiffness of left shoulder, not elsewhere classified: Secondary | ICD-10-CM | POA: Diagnosis present

## 2019-08-01 DIAGNOSIS — M545 Low back pain, unspecified: Secondary | ICD-10-CM

## 2019-08-01 DIAGNOSIS — G8929 Other chronic pain: Secondary | ICD-10-CM | POA: Diagnosis present

## 2019-08-01 NOTE — Patient Instructions (Signed)
Access Code: [email or web address]  URL: [email or web address]  Date: 08/01/2019  Prepared by: Janna Arch   Exercises Supine Posterior Pelvic Tilt - 10 reps - 2 sets - 5 hold - 1x daily - 7x weekly Seated Transversus Abdominis Bracing - 10 reps - 2 sets - 5 hold - 1x daily - 7x weekly Supine Sciatic Nerve Mobilization With Leg on Pillow - 10 reps - 2 sets - 5 hold - 1x daily - 7x weekly Seated Scapular Retraction - 10 reps - 2 sets - 5 hold - 1x daily - 7x weekly Supine Shoulder Flexion AAROM with Hands Clasped - 10 reps - 2 sets - 5 hold - 1x daily - 7x weekly

## 2019-08-01 NOTE — Therapy (Signed)
[place] MAIN [facility] SERVICES [address] [place], [place], [address] Phone: [phone]   Fax:  [phone]  Physical Therapy Evaluation  Patient Details  Name: Lynn Wilson MRN: [id number] Date of Birth: [birth year] Referring Provider (PT): Suezanne Cheshire   Encounter Date: 08/01/2019  PT End of Session - 08/01/19 1808    Visit Number  1    Number of Visits  16    Date for PT Re-Evaluation  09/26/19    Authorization Type  1/10 eval 08/01/19    PT Start Time  0802    PT Stop Time  0900    PT Time Calculation (min)  58 min    Activity Tolerance  Patient tolerated treatment well;Patient limited by pain    Behavior During Therapy  [facility] for tasks assessed/performed       Past Medical History:  Diagnosis Date  . GERD (gastroesophageal reflux disease)   . Hypertension   . Hypothyroid   . Hypothyroid 01/25/2018  . Iron deficiency anemia due to chronic blood loss 02/16/2018    Past Surgical History:  Procedure Laterality Date  . COLONOSCOPY WITH PROPOFOL N/A 09/08/2018   Procedure: COLONOSCOPY WITH PROPOFOL;  Surgeon: Lin Landsman, MD;  Location: [facility] ENDOSCOPY;  Service: Gastroenterology;  Laterality: N/A;  . ESOPHAGOGASTRODUODENOSCOPY (EGD) WITH PROPOFOL N/A 09/08/2018   Procedure: ESOPHAGOGASTRODUODENOSCOPY (EGD) WITH PROPOFOL;  Surgeon: Lin Landsman, MD;  Location: [facility] ENDOSCOPY;  Service: Gastroenterology;  Laterality: N/A;    There were no vitals filed for this visit.   Subjective Assessment - 08/01/19 0816    Subjective  Patient is a pleasant 38 year old female who presents for chronic left shoulder pain and lumbar back pain with radiculopathy.    Pertinent History  Patient is a pleasant 37 year old female who presents for chronic left shoulder pain and lumbar back pain with radiculopathy. Per previous documentation patient has chronic left shoulder pain for the past 2-3 years that has recently worsened in the  past few months with decreasing ROM. She has been to the ER for her low back pain. She reports she has a pinched nerve. 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 Back pain began about a month ago and limits her ability to bend down and walk. Was affecting her left leg and now is affecting the right leg down to big toe.  PMH includes: migraines, hyperglycemia, metabolic syndrome, HTN, GERD, Hypothyroidism, and morbid obesity. She lives with her wife and works for [employer]. Her job duties include sweeping, mopping, dusting, lifting.    Limitations  Sitting;Lifting;Standing;Walking;House hold activities;Other (comment)    How long can you sit comfortably?  30 minutes    How long can you stand comfortably?  30 minutes    How long can you walk comfortably?  hurts as soon as start walking    Diagnostic tests  Imaging: X ray: lower lumbar facet arthritis.    Patient Stated Goals  reduce pain in shoulder and back and manage pain independently, move her shoulder again.    Currently in Pain?  Yes    Pain Score  6     Pain Location  Shoulder    Pain Orientation  Left    Pain Type  Chronic pain    Pain Radiating Towards  elbow    Pain Onset  More than a month ago    Pain Frequency  Intermittent    Aggravating Factors   moving, lifting, reaching    Pain  Relieving Factors  rest,    Effect of Pain on Daily Activities  limits ability to perform work tasks    Multiple Pain Sites  Yes    Pain Score  5    Pain Location  Back    Pain Orientation  Lower    Pain Descriptors / Indicators  Aching;Radiating    Pain Type  Acute pain    Pain Radiating Towards  right big toe    Pain Onset  1 to 4 weeks ago    Pain Frequency  Constant    Aggravating Factors   standing, walking, bending    Pain Relieving Factors  rest    Effect of Pain on Daily Activities  limits all ADLs and work tasks         [facility] PT Assessment - 08/01/19 0001      Assessment   Medical Diagnosis  L shoulder pain/lumbar back pain     Referring Provider (PT)  Cecil Cranker, Elizabeth    Onset Date/Surgical Date  --   shoulder 2017, back 07/06/2019   Hand Dominance  Right    Prior Therapy  nope      Precautions   Precautions  None      Restrictions   Weight Bearing Restrictions  No      Balance Screen   Has the patient fallen in the past 6 months  No    Has the patient had a decrease in activity level because of a fear of falling?   Yes    Is the patient reluctant to leave their home because of a fear of falling?   Yes      [place]  Private residence    Living Arrangements  Spouse/significant other    Available Help at Discharge  Family    Type of [place] to enter;Level entry    [place]  Two level    Alternate Level Stairs-Number of Steps  flight    Alternate Level Stairs-Rails  Left   going      Prior Function   Level of Independence  Independent    Vocation  Full time employment    Vocation Requirements  --   sweeping, mopping, dusting, lifting.    Leisure  basketball,       Cognition   Overall Cognitive Status  Within Functional Limits for tasks assessed      Chronic left shoulder pain, lumbar back pain with radiculopathy affecting left lower extremity     PAIN: Shoulder: Left Worst: 8/10 with attempting reaching Average: 6/10 Current pain: 4/10   Back: lumbar Worst: 10/10 when lifting Average: 6/10 Current pain: 5/10   POSTURE: Sitting: L head tilt; forward head rounded shoulder, anterior pelvic rotation bilaterally with excessive lordosis.   Standing: anterior pelvic tilt with lordotic hinge.   PROM/AROM:  Shoulder: seated AROM flexion: R: 165, L 105 * painful to back of elbow Abduction R: 169, L 71 degrees painful  ER: R: WFL, L:  unable to place hands on back of head  IR R: WFL: L sacral level with pain.   Shoulder PROM supine Flexion: L 102 with guarding* painful Abduction: L 81 degrees with guarding *painful  Lumbar:   Trunk Flexion 30 degrees severe in pain in R side,   Trunk Extension 11 degrees painful  Trunk R SB Limited by 50% painful  Trunk L SB Limited by 50 %painful  Trunk R rotation  25% painful  Trunk L rotation 25% painful    Passive accessory motions:  Shoulder:  AP: painful empty  Inferior: painful empty  PA: pain reducing.  Back:  Lumbar spine hypomobile and painful to grade II mobilizations, Left UPAs create concordant pain in right.  Thoracic inferior mobilizations reduce pain in shoulder with grade II.    STRENGTH:  Graded on a 0-5 scale Muscle Group Left Right  Shoulder flex Unable to test due to pain: 2/5 4/5  Shoulder Abd Unable to formally test due to pain  2/5 4/5  Shoulder Ext Unable to test due to pain 4/5  Shoulder IR/ER Unable to test due to pain 4/5  Elbow Painful against resistance: 3/5  4/5  Wrist/hand 3+/5  4/5  Hip Flex 3/5 severe pain in R spine * 3/5*  Hip Abd 3/5 3+/5  Hip Add 2-/5 2+/5   Hip Ext Painful 2/5 Painful 2/5  Hip IR/ER    Knee Flex 3+/5 3/5 * in back of calf   Knee Ext 3+/5 4/5  Ankle DF 3+/5 3+/5  Ankle PF 3+/5 3+/5  *pain   SENSATION: Slight reduction in RLE than LLE  SPECIAL TESTS: Slump: +RLE SLR: - bilaterally: sore but not radiating pain   FUNCTIONAL MOBILITY: STS: excessive arching of lumbar spine creating a hinging mechanicsm Supine <>sit:  Painful and increased time required Sit<>prone: painful and increased time required.    GAIT: Antalgic gait pattern with limited step length and spinal rotation.   OUTCOME MEASURES: TEST Outcome Interpretation  5 times sit<>stand 22 sec very painful  >60 yo, >15 sec indicates increased risk for falls  MODI 40% Moderate disability  QuickDash  Standard: 43.18% Work: 50% Sport: 50% (basketball):                    Objective measurements completed on examination: See above findings.     Patient demonstrates excessive lumbar lordosis and anterior pelvic shift. Her pain is  reduced with posterior pelvic tilts and abdominal activation indicating good rehab potential.  Shoulder pain is additionally aggravated by forward head rounded shoulders posture. PA mobilizations reduced shoulder pain. Her left LE is limited in strength compared to her right however her right is where she reports her radiating pain.Patient will benefit from skilled physical therapy to reduce pain in low back and left shoulder, improve functional ROM, and return to PLOF.     Access Code: [email or web address]  URL: [email or web address]  Date: 08/01/2019  Prepared by: Janna Arch   Exercises Supine Posterior Pelvic Tilt - 10 reps - 2 sets - 5 hold - 1x daily - 7x weekly Seated Transversus Abdominis Bracing - 10 reps - 2 sets - 5 hold - 1x daily - 7x weekly Supine Sciatic Nerve Mobilization With Leg on Pillow - 10 reps - 2 sets - 5 hold - 1x daily - 7x weekly Seated Scapular Retraction - 10 reps - 2 sets - 5 hold - 1x daily - 7x weekly Supine Shoulder Flexion AAROM with Hands Clasped - 10 reps - 2 sets - 5 hold - 1x daily - 7x weekly      PT Education - 08/01/19 1808    Education Details  goals, POC, HEP    Person(s) Educated  Patient    Methods  Explanation;Demonstration;Tactile cues;Verbal cues;Handout    Comprehension  Verbalized understanding;Returned demonstration;Verbal cues required;Tactile cues required;Need further instruction       PT Short Term Goals - 08/01/19 1813      PT  SHORT TERM GOAL #1   Title  Patient will be independent in home exercise program to improve strength/mobility for better functional independence with ADLs.    Baseline  HEP given    Time  2    Period  Weeks    Status  New    Target Date  09/26/19      PT SHORT TERM GOAL #2   Title  Patient will report a worst pain of 6/10 on VAS in  shoulder and back to improve tolerance with ADLs and reduced symptoms with activities.    Baseline  8/18: shoulder 8/10, back 10/10    Time  2    Period  Weeks     Status  New    Target Date  08/15/19        PT Long Term Goals - 08/01/19 1815      PT LONG TERM GOAL #1   Title  Patient will report a worst pain of 3/10 on VAS in shoulder and back to improve tolerance with ADLs and reduced symptoms with activities.    Baseline  8/18: L shoulder 8/10, back 10/10    Time  8    Period  Weeks    Status  New    Target Date  09/26/19      PT LONG TERM GOAL #2   Title  Patient will improve shoulder AROM to > 140 degrees of flexion and abduction for improved ability to perform overhead activities.    Baseline  8/18: flexion 105, abduction 71    Time  8    Period  Weeks    Status  New    Target Date  09/26/19      PT LONG TERM GOAL #3   Title  Patient will decrease Quick DASH score by > 8 points (35.18%) demonstrating reduced self-reported upper extremity disability.    Baseline  8/18: standard 43.18%    Time  8    Period  Weeks    Status  New    Target Date  09/26/19      PT LONG TERM GOAL #4   Title  Patient (< 76 years old) will complete five times sit to stand test in < 10 seconds indicating an increased LE strength and improved balance.    Baseline  8/18: 22 seconds very painful    Time  8    Period  Weeks    Status  New    Target Date  09/26/19      PT LONG TERM GOAL #5   Title  Patient will reduce modified Oswestry score to <20 as to demonstrate minimal disability with ADLs including improved sleeping tolerance, walking/sitting tolerance etc for better mobility with ADLs.    Baseline  8/18:40%    Time  8    Period  Weeks    Status  New    Target Date  09/26/19             Plan - 08/01/19 1809    Clinical Impression Statement  Patient demonstrates excessive lumbar lordosis and anterior pelvic shift. Her pain is reduced with posterior pelvic tilts and abdominal activation indicating good rehab potential.  Shoulder pain is additionally aggravated by forward head rounded shoulders posture. PA mobilizations reduced shoulder pain.  Her left LE is limited in strength compared to her right however her right is where she reports her radiating pain.Patient will benefit from skilled physical therapy to reduce pain in low back and left shoulder,  improve functional ROM, and return to PLOF.    Personal Factors and Comorbidities  Education;Finances;Fitness;Past/Current Experience;Profession;Social Background;Time since onset of injury/illness/exacerbation;Comorbidity 3+    Comorbidities  GERD, HTN, hypothyroidism, anemia, obesity    Examination-Activity Limitations  Bed Mobility;Bend;Caring for Others;Carry;Lift;Dressing;Locomotion Level;Sit;Squat;Stairs;Stand;Toileting;Transfers    Examination-Participation Restrictions  Church;Cleaning;Community Activity;Driving;Interpersonal Relationship;Laundry;Shop;Volunteer;Yard Work;Other    Stability/Clinical Decision Making  Evolving/Moderate complexity    Clinical Decision Making  Moderate    Rehab Potential  Good    PT Frequency  2x / week    PT Duration  8 weeks    PT Treatment/Interventions  ADLs/Self Care Home Management;Aquatic Therapy;Cryotherapy;Electrical Stimulation;Moist Heat;Iontophoresis 4mg /ml Dexamethasone;Traction;Ultrasound;Fluidtherapy;DME Instruction;Gait training;Stair training;Functional mobility training;Neuromuscular re-education;Balance training;Therapeutic exercise;Therapeutic activities;Patient/family education;Manual techniques;Dry needling;Passive range of motion;Energy conservation;Splinting;Taping    PT Next Visit Plan  review HEP, shoulder mobs, progress PROM HEP, distraction    PT Home Exercise Plan  see above    Consulted and Agree with Plan of Care  Patient       Patient will benefit from skilled therapeutic intervention in order to improve the following deficits and impairments:  Abnormal gait, Decreased activity tolerance, Decreased endurance, Decreased mobility, Decreased range of motion, Difficulty walking, Decreased strength, Hypomobility, Impaired  flexibility, Impaired perceived functional ability, Increased muscle spasms, Impaired sensation, Impaired UE functional use, Obesity, Postural dysfunction, Improper body mechanics, Pain  Visit Diagnosis: 1. Acute low back pain, unspecified back pain laterality, unspecified whether sciatica present   2. Chronic left shoulder pain   3. Stiffness of left shoulder, not elsewhere classified   4. Other abnormalities of gait and mobility        Problem List Patient Active Problem List   Diagnosis Date Noted  . Chronic left shoulder pain 02/22/2019  . Chronic gastritis without bleeding 02/22/2019  . Menorrhagia with regular cycle 05/10/2018  . Internal bleeding hemorrhoids 05/10/2018  . Migraine without aura and responsive to treatment 02/16/2018  . Iron deficiency anemia due to chronic blood loss 02/16/2018  . Hyperglycemia 01/29/2018  . Metabolic syndrome 83/38/2505  . Hypertension 01/25/2018  . GERD (gastroesophageal reflux disease) 01/25/2018  . Hypothyroid 01/25/2018  . Morbid obesity ([place]) 01/25/2018   Janna Arch, PT, DPT   08/01/2019, 6:20 PM  [place] MAIN [facility] SERVICES [address] [place], [place], [address] Phone: [phone]   Fax:  [phone]  Name: AZYIAH BO MRN: [id number] Date of Birth: September 08, 1981

## 2019-08-03 ENCOUNTER — Ambulatory Visit: Payer: BLUE CROSS/BLUE SHIELD

## 2019-08-07 ENCOUNTER — Other Ambulatory Visit: Payer: Self-pay

## 2019-08-07 ENCOUNTER — Ambulatory Visit (INDEPENDENT_AMBULATORY_CARE_PROVIDER_SITE_OTHER): Payer: BLUE CROSS/BLUE SHIELD | Admitting: Family Medicine

## 2019-08-07 ENCOUNTER — Encounter: Payer: Self-pay | Admitting: Family Medicine

## 2019-08-07 VITALS — BP 130/86 | HR 85 | Temp 97.8°F | Resp 16 | Ht 65.0 in | Wt 236.7 lb

## 2019-08-07 DIAGNOSIS — M25612 Stiffness of left shoulder, not elsewhere classified: Secondary | ICD-10-CM

## 2019-08-07 DIAGNOSIS — I10 Essential (primary) hypertension: Secondary | ICD-10-CM | POA: Diagnosis not present

## 2019-08-07 DIAGNOSIS — M25512 Pain in left shoulder: Secondary | ICD-10-CM | POA: Diagnosis not present

## 2019-08-07 DIAGNOSIS — G8929 Other chronic pain: Secondary | ICD-10-CM

## 2019-08-07 MED ORDER — AMLODIPINE BESYLATE 5 MG PO TABS: 5.0000 mg | ORAL_TABLET | Freq: Every day | ORAL | 1 refills | Status: DC

## 2019-08-07 MED ORDER — HYDROCHLOROTHIAZIDE 12.5 MG PO TABS: 12.5000 mg | ORAL_TABLET | Freq: Every day | ORAL | 1 refills | Status: DC

## 2019-08-07 MED ORDER — PREDNISONE 10 MG PO TABS: ORAL_TABLET | ORAL | 0 refills | Status: DC

## 2019-08-07 NOTE — Progress Notes (Signed)
Name: Lynn Wilson   MRN: [id number]    DOB: 04-11-1981   Date:08/07/2019       Progress Note  Subjective  Chief Complaint  Chief Complaint  Patient presents with  . Shoulder Pain    left shoulder, hard to raise and feels like its rubbing    HPI  Pt presents with concern for LEFT shoulder pain.  She was sent to PT in August and after that initial visit she has had left shoulder pain, decreased AROM, shoulder stiffness that has been worse since this visit.  She has been trying heat, gently massage and ROM exercises, but nothing has helped.  She is taking naproxen and flexeril, though she only takes when the pain is unbearable.  She notes pain is from the left side of the neck and sometimes radiates into the LEFT elbow.  HTN: She has been doing well on amlodipine and HCTZ. BP is at goal today; needs refills of both medications.  Denies chest pain, shortness of breath, BLE edema.  Patient Active Problem List   Diagnosis Date Noted  . Chronic left shoulder pain 02/22/2019  . Chronic gastritis without bleeding 02/22/2019  . Menorrhagia with regular cycle 05/10/2018  . Internal bleeding hemorrhoids 05/10/2018  . Migraine without aura and responsive to treatment 02/16/2018  . Iron deficiency anemia due to chronic blood loss 02/16/2018  . Hyperglycemia 01/29/2018  . Metabolic syndrome [id number]  . Hypertension 01/25/2018  . GERD (gastroesophageal reflux disease) 01/25/2018  . Hypothyroid 01/25/2018  . Morbid obesity ([place]) 01/25/2018    Social History   Tobacco Use  . Smoking status: Current Every Day Smoker    Packs/day: 0.50    Years: 19.00    Pack years: 9.50    Types: Cigarettes    Start date: 01/26/2000  . Smokeless tobacco: Never Used  . Tobacco comment: contemplating   Substance Use Topics  . Alcohol use: Yes    Alcohol/week: 2.0 standard drinks    Types: 2 Cans of beer per week     Current Outpatient Medications:  .  amLODipine (NORVASC) 5 MG tablet, Take  1 tablet (5 mg total) by mouth daily., Disp: 90 tablet, Rfl: 1 .  baclofen (LIORESAL) 20 MG tablet, Take 1 tablet (20 mg total) by mouth 3 (three) times daily. Prn migraine, Disp: 30 each, Rfl: 0 .  Cetirizine HCl 10 MG CAPS, Take 1 capsule (10 mg total) by mouth daily., Disp: 30 capsule, Rfl: 0 .  cholecalciferol (VITAMIN D) 1000 units tablet, Take 2,000 Units by mouth daily., Disp: , Rfl:  .  cyclobenzaprine (FLEXERIL) 5 MG tablet, Take 1 tablet (5 mg total) by mouth 3 (three) times daily as needed., Disp: 15 tablet, Rfl: 0 .  dicyclomine (BENTYL) 20 MG tablet, Take 1 tablet (20 mg total) by mouth 4 (four) times daily -  before meals and at bedtime., Disp: 40 tablet, Rfl: 0 .  hydrochlorothiazide (HYDRODIURIL) 12.5 MG tablet, Take 1 tablet (12.5 mg total) by mouth daily., Disp: 90 tablet, Rfl: 1 .  levothyroxine (SYNTHROID, LEVOTHROID) 100 MCG tablet, Take 1 tablet (100 mcg total) by mouth every morning., Disp: 90 tablet, Rfl: 1 .  medroxyPROGESTERone (DEPO-PROVERA) 150 MG/ML injection, Inject 1 mL (150 mg total) into the muscle every 3 (three) months., Disp: 1 mL, Rfl: 3 .  naproxen (NAPROSYN) 500 MG tablet, Take 1 tablet (500 mg total) by mouth 2 (two) times daily with a meal., Disp: 30 tablet, Rfl: 0 .  pantoprazole (PROTONIX) 40  MG tablet, Take 1 tablet (40 mg total) by mouth 2 (two) times daily before a meal., Disp: 180 tablet, Rfl: 1 .  SUMAtriptan (IMITREX) 100 MG tablet, Take 1 tablet (100 mg total) by mouth every 2 (two) hours as needed for migraine. May repeat in 2 hours if headache persists or recurs., Disp: 10 tablet, Rfl: 0  Allergies  Allergen Reactions  . Celebrex [Celecoxib]     "shakes/tremors"  . Sesame Seed (Diagnostic) Itching  . Lisinopril Rash    I personally reviewed active problem list, medication list, allergies, notes from last encounter, lab results with the patient/caregiver today.  ROS  Ten systems reviewed and is negative except as mentioned in HPI   Objective  Vitals:   08/07/19 0800  BP: 130/86  Pulse: 85  Resp: 16  Temp: 97.8 F (36.6 C)  TempSrc: Oral  SpO2: 99%  Weight: 236 lb 11.2 oz (107.4 kg)  Height: 5\' 5"  (1.651 m)    Body mass index is 39.39 kg/m.  Nursing Note and Vital Signs reviewed.  Physical Exam  Constitutional: Patient appears well-developed and well-nourished. No distress.  HENT: Head: Normocephalic and atraumatic. Eyes: Conjunctivae and EOM are normal. No scleral icterus.  Neck: Normal range of motion. Neck supple. No JVD present. No thyromegaly present.  Cardiovascular: Normal rate, regular rhythm and normal heart sounds.  No murmur heard. No BLE edema. Pulmonary/Chest: Effort normal and breath sounds normal. No respiratory distress. Musculoskeletal: LEFT shoulder with limited flexion, extension, abduction; normal abduction. Weakness against resistance to left shoulder only; otherwise unremarkable MSK exam Neurological: Pt is alert and oriented to person, place, and time. No cranial nerve deficit. Coordination, balance, strength, speech and gait are normal.  Skin: Skin is warm and dry. No rash noted. No erythema.  Psychiatric: Patient has a normal mood and affect. behavior is normal. Judgment and thought content normal.  No results found for this or any previous visit (from the past 72 hour(s)).  Assessment & Plan  1. Essential hypertension - amLODipine (NORVASC) 5 MG tablet; Take 1 tablet (5 mg total) by mouth daily.  Dispense: 90 tablet; Refill: 1 - hydrochlorothiazide (HYDRODIURIL) 12.5 MG tablet; Take 1 tablet (12.5 mg total) by mouth daily.  Dispense: 90 tablet; Refill: 1  2. Chronic left shoulder pain - predniSONE (DELTASONE) 10 MG tablet; Day1:6tabs, Day2:5tabs, Day3:4tabs, Day4:3tabs, Day5:2tabs, Day6:1tab  Dispense: 21 tablet; Refill: 0 - Ambulatory referral to Orthopedics - Continue the flexeril PRN  3. Shoulder stiffness, left - predniSONE (DELTASONE) 10 MG tablet; Day1:6tabs,  Day2:5tabs, Day3:4tabs, Day4:3tabs, Day5:2tabs, Day6:1tab  Dispense: 21 tablet; Refill: 0 - Ambulatory referral to Orthopedics - Continue the flexeril PRN  -Red flags and when to present for emergency care or RTC including fever >101.49F, chest pain, shortness of breath, new/worsening/un-resolving symptoms, reviewed with patient at time of visit. Follow up and care instructions discussed and provided in AVS.

## 2019-08-08 ENCOUNTER — Ambulatory Visit: Payer: BLUE CROSS/BLUE SHIELD

## 2019-08-10 ENCOUNTER — Ambulatory Visit: Payer: BLUE CROSS/BLUE SHIELD

## 2019-08-15 ENCOUNTER — Ambulatory Visit: Payer: BLUE CROSS/BLUE SHIELD | Attending: Nurse Practitioner

## 2019-08-17 ENCOUNTER — Ambulatory Visit: Payer: BLUE CROSS/BLUE SHIELD

## 2019-08-22 ENCOUNTER — Ambulatory Visit: Payer: BLUE CROSS/BLUE SHIELD

## 2019-08-24 ENCOUNTER — Ambulatory Visit: Payer: BLUE CROSS/BLUE SHIELD

## 2019-08-25 ENCOUNTER — Ambulatory Visit (INDEPENDENT_AMBULATORY_CARE_PROVIDER_SITE_OTHER): Payer: BLUE CROSS/BLUE SHIELD | Admitting: Family Medicine

## 2019-08-25 ENCOUNTER — Other Ambulatory Visit: Payer: Self-pay

## 2019-08-25 ENCOUNTER — Encounter: Payer: Self-pay | Admitting: Family Medicine

## 2019-08-25 DIAGNOSIS — R198 Other specified symptoms and signs involving the digestive system and abdomen: Secondary | ICD-10-CM

## 2019-08-25 DIAGNOSIS — Z23 Encounter for immunization: Secondary | ICD-10-CM | POA: Diagnosis not present

## 2019-08-25 DIAGNOSIS — G43001 Migraine without aura, not intractable, with status migrainosus: Secondary | ICD-10-CM

## 2019-08-25 DIAGNOSIS — N920 Excessive and frequent menstruation with regular cycle: Secondary | ICD-10-CM

## 2019-08-25 DIAGNOSIS — I10 Essential (primary) hypertension: Secondary | ICD-10-CM

## 2019-08-25 DIAGNOSIS — D500 Iron deficiency anemia secondary to blood loss (chronic): Secondary | ICD-10-CM

## 2019-08-25 DIAGNOSIS — E039 Hypothyroidism, unspecified: Secondary | ICD-10-CM

## 2019-08-25 DIAGNOSIS — K219 Gastro-esophageal reflux disease without esophagitis: Secondary | ICD-10-CM

## 2019-08-25 DIAGNOSIS — E8881 Metabolic syndrome: Secondary | ICD-10-CM

## 2019-08-25 MED ORDER — LEVOTHYROXINE SODIUM 100 MCG PO TABS: 100.0000 ug | ORAL_TABLET | ORAL | 1 refills | Status: DC

## 2019-08-25 MED ORDER — NIFEDIPINE POWD: 1.0000 | Freq: Two times a day (BID) | 0 refills | Status: DC

## 2019-08-25 MED ORDER — DICYCLOMINE HCL 20 MG PO TABS: 20.0000 mg | ORAL_TABLET | Freq: Three times a day (TID) | ORAL | 0 refills | Status: DC

## 2019-08-25 MED ORDER — PANTOPRAZOLE SODIUM 40 MG PO TBEC: 40.0000 mg | DELAYED_RELEASE_TABLET | Freq: Two times a day (BID) | ORAL | 1 refills | Status: DC

## 2019-08-25 NOTE — Patient Instructions (Signed)
Proctalgia Fugax  Proctalgia fugax is a condition that involves short episodes of intense pain in the rectum. The rectum is the last part of the large intestine. The pain can last from seconds to minutes. Episodes often occur during the night and wake the person from sleep. This condition is not a sign of cancer, but your health care provider may want to rule out other conditions. What are the causes? The exact cause of this condition is not known. One possible cause may be spasm of the pelvic muscles or spasms of the lowest part of the large intestine. What are the signs or symptoms? The only symptom of this condition is rectal pain. The pain may:  Be intense or severe.  Last for only a few seconds, or it may last up to 30 minutes.  Occur at night and wake you up from sleep. How is this diagnosed? This condition may be diagnosed based on your medical history, a physical exam, and by ruling out other problems that could cause the pain. You may have various tests, such as:  Anoscopy. In this test, a lighted scope is put into the rectum to look for abnormalities.  Barium enema. In this test, X-rays are taken after a white, chalky substance called barium is put into the colon. The barium shows up well on the X-rays and makes it easier to see any problems.  Blood tests to rule out infections or other problems. How is this treated? There is no specific treatment for this condition. This condition may be managed with:  Medicines.  Warm baths.  Relaxation techniques, such as deep breathing exercises or gentle yoga.  Gentle massage of the painful area.  Biofeedback. Follow these instructions at home:   Take over-the-counter and prescription medicines only as told by your health care provider.  Follow instructions from your health care provider about eating or drinking restrictions.  Ask your health care provider what activities are safe for you.  Try warm baths, massaging the area,  or progressive relaxation techniques as told by your health care provider.  Keep all follow-up visits as told by your health care provider. This is important. Contact a health care provider if:  Your pain gets worse.  You develop new symptoms.  You have anal pain along with a fever. Get help right away if you:  Have blood in your stool or blood coming from the rectal area. Summary  Proctalgia fugax is a condition that involves short episodes of intense pain in the rectum. Episodes often occur during the night and wake the person from sleep.  The cause of this condition is not known.  Medicines, warm baths, massage, relaxation techniques, and biofeedback may help to manage the pain.  Get help right away if you have blood in your stool or blood coming from the rectal area. This information is not intended to replace advice given to you by your health care provider. Make sure you discuss any questions you have with your health care provider. Document Released: 08/25/2001 Document Revised: 07/13/2018 Document Reviewed: 07/13/2018 Elsevier Patient Education  2020 [employer]  

## 2019-08-25 NOTE — Progress Notes (Signed)
Name: Lynn Wilson   MRN: [id number]    DOB: [birth year]   Date:08/25/2019       Progress Note  Subjective  Chief Complaint  Chief Complaint  Patient presents with  . Follow-up    6 month follow up  . Pain    Rectum onset 08/25/2019    HPI  Left  shoulder pain- She states she has a long history of left shoulder pain, she works in Water engineer and developed pain about 6 months ago, she was referred to Ortho but had to cancelled because of work and will try to re-schedule.   Migraine: she has been doing well since Dec 2019 after visit to [facility], symptoms improved with iron infusion, having some neck pain that causes a different type of headache, but is secondary to her left shoulder pain   Morbid obesity: she stopped drinking sodas daily, but has been drinking juices, discussed again importance of weight loss and gave her information about carbohydrate restrictive diet   HTN: she is back on HCTZ 12.5 and norvasc 5 mg, bp today was at goal. She denies palpitation or chest pain, except for shoulder pain that causes upper left wall pain   Tobacco [id number] 1/2ppd. She has episodes of bronchitis, she denies cough, wheezing or SOB   Hypothyroidism: sheis compliant with thyroid medication. No change in bowel movements she has chronic dry skin, TSH stable , same dose for over one year  Iron deficiency anemia: regular but heavy cycles even clots, used to get iron infusion, last infusion was end of 2019, her MCV is dropping and also HCT. She never startedDepo, she states she usually started her cycles on Sunday's explained she can come in any time during her period .  She also has hemorrhoidswithout rectal bleeding, chronic gastritis per EGD, seen by Dr. Marius Ditch and is taking Protonix bid since. Only had one hemorrhoid banding but did not go back for further therapy because of the pain after the procedure  Pre-diabetes/Metabolic Syndrome: sheendorses occasional  polyphagiaandpolyuria; denies polydipsia. Last A1C was 6.1, wife recently admitted for DKA, glucose of 800. Discussed carbohydrate restrictive diet . Reviewed last lipid panel with patient  Rectal pain: She has a history of hemorrhoids, states no straining or rectal bleeding, developed rectal pain last night described as throbbing and intermittent, woke her up during the night. Denies change in bowel movements  Patient Active Problem List   Diagnosis Date Noted  . Chronic left shoulder pain 02/22/2019  . Chronic gastritis without bleeding 02/22/2019  . Menorrhagia with regular cycle 05/10/2018  . Internal bleeding hemorrhoids 05/10/2018  . Migraine without aura and responsive to treatment 02/16/2018  . Iron deficiency anemia due to chronic blood loss 02/16/2018  . Hyperglycemia 01/29/2018  . Metabolic syndrome [id number]  . Hypertension 01/25/2018  . GERD (gastroesophageal reflux disease) 01/25/2018  . Hypothyroid 01/25/2018  . Morbid obesity ([place]) 01/25/2018    Past Surgical History:  Procedure Laterality Date  . COLONOSCOPY WITH PROPOFOL N/A 09/08/2018   Procedure: COLONOSCOPY WITH PROPOFOL;  Surgeon: Lin Landsman, MD;  Location: [facility] ENDOSCOPY;  Service: Gastroenterology;  Laterality: N/A;  . ESOPHAGOGASTRODUODENOSCOPY (EGD) WITH PROPOFOL N/A 09/08/2018   Procedure: ESOPHAGOGASTRODUODENOSCOPY (EGD) WITH PROPOFOL;  Surgeon: Lin Landsman, MD;  Location: [facility] ENDOSCOPY;  Service: Gastroenterology;  Laterality: N/A;    Family History  Problem Relation Age of Onset  . Hypertension Mother   . Hypertension Father   . Stroke Father     Social History   Socioeconomic  History  . Marital status: Married    Spouse name: Windy Canny  . Number of children: 0  . Years of education: Not on file  . Highest education level: Associate degree: occupational, Hotel manager, or vocational program  Occupational History  . Occupation: Chiropractor: [employer]   . Financial resource strain: Somewhat hard  . Food insecurity    Worry: Sometimes true    Inability: Sometimes true  . Transportation needs    Medical: No    Non-medical: No  Tobacco Use  . Smoking status: Current Every Day Smoker    Packs/day: 0.50    Years: 19.00    Pack years: 9.50    Types: Cigarettes    Start date: 01/26/2000  . Smokeless tobacco: Never Used  . Tobacco comment: contemplating   Substance and Sexual Activity  . Alcohol use: Yes    Alcohol/week: 2.0 standard drinks    Types: 2 Cans of beer per week  . Drug use: No  . Sexual activity: Yes    Birth control/protection: Other-see comments    Comment: homosexual   Lifestyle  . Physical activity    Days per week: 0 days    Minutes per session: 0 min  . Stress: Only a little  Relationships  . Social connections    Talks on phone: More than three times a week    Gets together: Once a week    Attends religious service: More than 4 times per year    Active member of club or organization: No    Attends meetings of clubs or organizations: Never    Relationship status: Living with partner  . Intimate partner violence    Fear of current or ex partner: No    Emotionally abused: No    Physically abused: No    Forced sexual activity: No  Other Topics Concern  . Not on file  Social History Narrative   She moved to [place] from [place], [place] in the Summer of 2018   She lives with her wife ( married since 02/19/2018 - homosexual    She does not have any children, but girlfriend has an adult daughter    She is now working at [employer]     Current Outpatient Medications:  .  amLODipine (NORVASC) 5 MG tablet, Take 1 tablet (5 mg total) by mouth daily., Disp: 90 tablet, Rfl: 1 .  Cetirizine HCl 10 MG CAPS, Take 1 capsule (10 mg total) by mouth daily., Disp: 30 capsule, Rfl: 0 .  cyclobenzaprine (FLEXERIL) 5 MG tablet, Take 1 tablet (5 mg total) by mouth 3 (three) times daily as needed., Disp: 15 tablet, Rfl: 0 .   dicyclomine (BENTYL) 20 MG tablet, Take 1 tablet (20 mg total) by mouth 4 (four) times daily -  before meals and at bedtime., Disp: 40 tablet, Rfl: 0 .  hydrochlorothiazide (HYDRODIURIL) 12.5 MG tablet, Take 1 tablet (12.5 mg total) by mouth daily., Disp: 90 tablet, Rfl: 1 .  levothyroxine (SYNTHROID, LEVOTHROID) 100 MCG tablet, Take 1 tablet (100 mcg total) by mouth every morning., Disp: 90 tablet, Rfl: 1 .  pantoprazole (PROTONIX) 40 MG tablet, Take 1 tablet (40 mg total) by mouth 2 (two) times daily before a meal., Disp: 180 tablet, Rfl: 1 .  cholecalciferol (VITAMIN D) 1000 units tablet, Take 2,000 Units by mouth daily., Disp: , Rfl:  .  medroxyPROGESTERone (DEPO-PROVERA) 150 MG/ML injection, Inject 1 mL (150 mg total) into the muscle every 3 (  three) months. (Patient not taking: Reported on 08/25/2019), Disp: 1 mL, Rfl: 3 .  naproxen (NAPROSYN) 500 MG tablet, Take 1 tablet (500 mg total) by mouth 2 (two) times daily with a meal. (Patient not taking: Reported on 08/25/2019), Disp: 30 tablet, Rfl: 0 .  predniSONE (DELTASONE) 10 MG tablet, Day1:6tabs, Day2:5tabs, Day3:4tabs, Day4:3tabs, Day5:2tabs, Day6:1tab (Patient not taking: Reported on 08/25/2019), Disp: 21 tablet, Rfl: 0 .  SUMAtriptan (IMITREX) 100 MG tablet, Take 1 tablet (100 mg total) by mouth every 2 (two) hours as needed for migraine. May repeat in 2 hours if headache persists or recurs. (Patient not taking: Reported on 08/25/2019), Disp: 10 tablet, Rfl: 0  Allergies  Allergen Reactions  . Celebrex [Celecoxib]     "shakes/tremors"  . Sesame Seed (Diagnostic) Itching  . Lisinopril Rash    I personally reviewed active problem list, medication list, allergies, family history, social history with the patient/caregiver today.   ROS  Constitutional: Negative for fever or weight change.  Respiratory: Negative for cough and shortness of breath.   Cardiovascular: Negative for chest pain or palpitations.  Gastrointestinal: Negative for  abdominal pain, no bowel changes.  Musculoskeletal: Negative for gait problem or joint swelling.  Skin: Negative for rash.  Neurological: Negative for dizziness , positive for intermittent  headache.  No other specific complaints in a complete review of systems (except as listed in HPI above).  Objective  Vitals:   08/25/19 0745  BP: 128/70  Pulse: 77  Resp: 14  Temp: 97.8 F (36.6 C)  SpO2: 99%  Weight: 235 lb 12.8 oz (107 kg)  Height: 5\' 5"  (1.651 m)    Body mass index is 39.24 kg/m.  Physical Exam  Constitutional: Patient appears well-developed and well-nourished. Obese No distress.  HEENT: head atraumatic, normocephalic, pupils equal and reactive to light Cardiovascular: Normal rate, regular rhythm and normal heart sounds.  No murmur heard. No BLE edema. Pulmonary/Chest: Effort normal and breath sounds normal. No respiratory distress. Abdominal: Soft.  There is no tenderness. No rectal lesions, in pain during exam, no stools on rectal vault  Psychiatric: Patient has a normal mood and affect. behavior is normal. Judgment and thought content normal.  Recent Results (from the past 2160 hour(s))  Urinalysis, Complete w Microscopic     Status: Abnormal   Collection Time: 07/07/19  1:14 PM  Result Value Ref Range   Color, Urine STRAW (A) YELLOW   APPearance CLEAR (A) CLEAR   Specific Gravity, Urine 1.006 1.005 - 1.030   pH 5.0 5.0 - 8.0   Glucose, UA NEGATIVE NEGATIVE mg/dL   Hgb urine dipstick NEGATIVE NEGATIVE   Bilirubin Urine NEGATIVE NEGATIVE   Ketones, ur NEGATIVE NEGATIVE mg/dL   Protein, ur NEGATIVE NEGATIVE mg/dL   Nitrite NEGATIVE NEGATIVE   Leukocytes,Ua NEGATIVE NEGATIVE   RBC / HPF 0-5 0 - 5 RBC/hpf   WBC, UA 0-5 0 - 5 WBC/hpf   Bacteria, UA RARE (A) NONE SEEN   Squamous Epithelial / LPF 0-5 0 - 5    Comment: Performed at [facility], [address] [place],  [address]  Pregnancy, urine POC     Status: None   Collection Time: 07/07/19   4:45 PM  Result Value Ref Range   Preg Test, Ur NEGATIVE NEGATIVE    Comment:        THE SENSITIVITY OF THIS METHODOLOGY IS >24 mIU/mL   Cervicovaginal ancillary only     Status: None   Collection Time: 07/12/19 12:00 AM  Result Value Ref Range   Bacterial vaginitis Negative for Bacterial Vaginitis Microorganisms     Comment: Normal Reference Range - Negative   Candida vaginitis Negative for Candida species     Comment: Normal Reference Range - Negative   Trichomonas Negative     Comment: Normal Reference Range - Negative   Chlamydia Negative     Comment: Normal Reference Range - Negative   Neisseria gonorrhea Negative     Comment: Normal Reference Range - Negative  CBC with Differential     Status: Abnormal   Collection Time: 07/12/19 11:25 AM  Result Value Ref Range   WBC 7.0 3.8 - 10.8 Thousand/uL   RBC 5.06 3.80 - 5.10 Million/uL   Hemoglobin 12.6 11.7 - 15.5 g/dL   HCT 39.0 35.0 - 45.0 %   MCV 77.1 (L) 80.0 - 100.0 fL   MCH 24.9 (L) 27.0 - 33.0 pg   MCHC 32.3 32.0 - 36.0 g/dL   RDW 15.7 (H) 11.0 - 15.0 %   Platelets 388 140 - 400 Thousand/uL   MPV 10.4 7.5 - 12.5 fL   Neutro Abs 3,850 1,500 - 7,800 cells/uL   Lymphs Abs 2,513 850 - 3,900 cells/uL   Absolute Monocytes 539 200 - 950 cells/uL   Eosinophils Absolute 77 15 - 500 cells/uL   Basophils Absolute 21 0 - 200 cells/uL   Neutrophils Relative % 55 %   Total Lymphocyte 35.9 %   Monocytes Relative 7.7 %   Eosinophils Relative 1.1 %   Basophils Relative 0.3 %  COMPLETE METABOLIC PANEL WITH GFR     Status: Abnormal   Collection Time: 07/12/19 11:25 AM  Result Value Ref Range   Glucose, Bld 110 (H) 65 - 99 mg/dL    Comment: .            Fasting reference interval . For someone without known diabetes, a glucose value between 100 and 125 mg/dL is consistent with prediabetes and should be confirmed with a follow-up test. .    BUN 8 7 - 25 mg/dL   Creat 0.72 0.50 - 1.10 mg/dL   GFR, Est Non African American  108 > OR = 60 mL/min/1.22m2   GFR, Est African American 125 > OR = 60 mL/min/1.87m2   BUN/Creatinine Ratio NOT APPLICABLE 6 - 22 (calc)   Sodium 136 135 - 146 mmol/L   Potassium 4.0 3.5 - 5.3 mmol/L   Chloride 105 98 - 110 mmol/L   CO2 24 20 - 32 mmol/L   Calcium 9.2 8.6 - 10.2 mg/dL   Total Protein 7.6 6.1 - 8.1 g/dL   Albumin 4.2 3.6 - 5.1 g/dL   Globulin 3.4 1.9 - 3.7 g/dL (calc)   AG Ratio 1.2 1.0 - 2.5 (calc)   Total Bilirubin 0.3 0.2 - 1.2 mg/dL   Alkaline phosphatase (APISO) 79 31 - 125 U/L   AST 29 10 - 30 U/L   ALT 30 (H) 6 - 29 U/L  Lipid Profile     Status: Abnormal   Collection Time: 07/12/19 11:25 AM  Result Value Ref Range   Cholesterol 168 <200 mg/dL   HDL 48 (L) > OR = 50 mg/dL   Triglycerides 69 <150 mg/dL   LDL Cholesterol (Calc) 104 (H) mg/dL (calc)    Comment: Reference range: <100 . Desirable range <100 mg/dL for primary prevention;   <70 mg/dL for patients with CHD or diabetic patients  with > or = 2 CHD risk factors. Marland Kitchen LDL-C is now  calculated using the Martin-Hopkins  calculation, which is a validated novel method providing  better accuracy than the Friedewald equation in the  estimation of LDL-C.  Cresenciano Genre et al. Annamaria Helling. [id number]): 2061-2068  (http://education.[facility].com/faq/FAQ164)    Total CHOL/HDL Ratio 3.5 <5.0 (calc)   Non-HDL Cholesterol (Calc) 120 <130 mg/dL (calc)    Comment: For patients with diabetes plus 1 major ASCVD risk  factor, treating to a non-HDL-C goal of <100 mg/dL  (LDL-C of <70 mg/dL) is considered a therapeutic  option.   TSH     Status: None   Collection Time: 07/12/19 11:25 AM  Result Value Ref Range   TSH 2.88 mIU/L    Comment:           Reference Range .           > or = 20 Years  0.40-4.50 .                Pregnancy Ranges           First trimester    0.26-2.66           Second trimester   0.55-2.73           Third trimester    0.43-2.91   HIV Antibody (routine testing w rflx)     Status: None    Collection Time: 07/12/19 11:25 AM  Result Value Ref Range   HIV 1&2 Ab, 4th Generation NON-REACTIVE NON-REACTI    Comment: HIV-1 antigen and HIV-1/HIV-2 antibodies were not detected. There is no laboratory evidence of HIV infection. Marland Kitchen PLEASE NOTE: This information has been disclosed to you from records whose confidentiality may be protected by state law.  If your state requires such protection, then the state law prohibits you from making any further disclosure of the information without the specific written consent of the person to whom it pertains, or as otherwise permitted by law. A general authorization for the release of medical or other information is NOT sufficient for this purpose. . For additional information please refer to http://education.[facility].com/faq/FAQ106 (This link is being provided for informational/ educational purposes only.) . Marland Kitchen The performance of this assay has not been clinically validated in patients less than 107 years old. .   RPR     Status: None   Collection Time: 07/12/19 11:25 AM  Result Value Ref Range   RPR Ser Ql NON-REACTIVE NON-REACTI    PHQ2/9: Depression screen [facility] 2/9 08/25/2019 08/07/2019 07/12/2019 02/22/2019 11/16/2018  Decreased Interest 0 0 0 0 0  Down, Depressed, Hopeless 0 0 0 0 0  PHQ - 2 Score 0 0 0 0 0  Altered sleeping 2 0 0 3 2  Tired, decreased energy 2 0 0 2 0  Change in appetite 2 0 0 3 0  Feeling bad or failure about yourself  0 0 0 0 0  Trouble concentrating 0 0 0 0 0  Moving slowly or fidgety/restless 0 0 0 0 0  Suicidal thoughts 0 0 0 0 0  PHQ-9 Score 6 0 0 8 2  Difficult doing work/chores Somewhat difficult Not difficult at all Not difficult at all Somewhat difficult Not difficult at all    phq 9 is negative    Fall Risk: Fall Risk  08/25/2019 08/07/2019 07/12/2019 02/22/2019 11/16/2018  Falls in the past year? 0 0 0 0 0  Number falls in past yr: 0 0 0 0 0  Injury with Fall? 0 0 0 0 0  Comment - - - - -  Follow up - Falls evaluation completed - - -      Assessment & Plan  1. Iron deficiency anemia due to chronic blood loss  - CBC with Differential/Platelet - Iron, TIBC and Ferritin Panel  2. Menorrhagia with regular cycle  - CBC with Differential/Platelet - Iron, TIBC and Ferritin Panel  3. Essential hypertension  At goal   4. Hypothyroidism, adult  - levothyroxine (SYNTHROID) 100 MCG tablet; Take 1 tablet (100 mcg total) by mouth every morning.  Dispense: 90 tablet; Refill: 1  5. Morbid obesity ([place])  Discussed with the patient the risk posed by an increased BMI. Discussed importance of portion control, calorie counting and at least 150 minutes of physical activity weekly. Avoid sweet beverages and drink more water. Eat at least 6 servings of fruit and vegetables daily   6. Migraine without aura and with status migrainosus, not intractable   7. Metabolic syndrome  - Hemoglobin A1c   8. Tenesmus (rectal)  We will send a rectal rocket to her manipulation pharmacy  9. Gastroesophageal reflux disease without esophagitis  - dicyclomine (BENTYL) 20 MG tablet; Take 1 tablet (20 mg total) by mouth 4 (four) times daily -  before meals and at bedtime.  Dispense: 40 tablet; Refill: 0 - pantoprazole (PROTONIX) 40 MG tablet; Take 1 tablet (40 mg total) by mouth 2 (two) times daily before a meal.  Dispense: 180 tablet; Refill: 1

## 2019-08-26 LAB — CBC WITH DIFFERENTIAL/PLATELET
Absolute Monocytes: 544 cells/uL (ref 200–950)
Basophils Absolute: 20 cells/uL (ref 0–200)
Basophils Relative: 0.3 %
Eosinophils Absolute: 102 cells/uL (ref 15–500)
Eosinophils Relative: 1.5 %
HCT: 38 % (ref 35.0–45.0)
Hemoglobin: 12 g/dL (ref 11.7–15.5)
Lymphs Abs: 2972 cells/uL (ref 850–3900)
MCH: 24.7 pg — ABNORMAL LOW (ref 27.0–33.0)
MCHC: 31.6 g/dL — ABNORMAL LOW (ref 32.0–36.0)
MCV: 78.4 fL — ABNORMAL LOW (ref 80.0–100.0)
MPV: 10.2 fL (ref 7.5–12.5)
Monocytes Relative: 8 %
Neutro Abs: 3162 cells/uL (ref 1500–7800)
Neutrophils Relative %: 46.5 %
Platelets: 352 10*3/uL (ref 140–400)
RBC: 4.85 10*6/uL (ref 3.80–5.10)
RDW: 15.7 % — ABNORMAL HIGH (ref 11.0–15.0)
Total Lymphocyte: 43.7 %
WBC: 6.8 10*3/uL (ref 3.8–10.8)

## 2019-08-26 LAB — IRON,TIBC AND FERRITIN PANEL
%SAT: 7 % (calc) — ABNORMAL LOW (ref 16–45)
Ferritin: 11 ng/mL — ABNORMAL LOW (ref 16–154)
Iron: 28 ug/dL — ABNORMAL LOW (ref 40–190)
TIBC: 396 mcg/dL (calc) (ref 250–450)

## 2019-08-26 LAB — HEMOGLOBIN A1C
Hgb A1c MFr Bld: 5.9 % of total Hgb — ABNORMAL HIGH (ref <5.7)
Mean Plasma Glucose: 123 (calc)
eAG (mmol/L): 6.8 (calc)

## 2019-08-29 ENCOUNTER — Ambulatory Visit: Payer: BLUE CROSS/BLUE SHIELD

## 2019-08-31 ENCOUNTER — Ambulatory Visit: Payer: BLUE CROSS/BLUE SHIELD

## 2019-09-05 ENCOUNTER — Ambulatory Visit: Payer: BLUE CROSS/BLUE SHIELD

## 2019-09-07 ENCOUNTER — Ambulatory Visit: Payer: BLUE CROSS/BLUE SHIELD

## 2019-09-30 ENCOUNTER — Other Ambulatory Visit: Payer: Self-pay

## 2019-09-30 ENCOUNTER — Encounter: Payer: Self-pay | Admitting: Emergency Medicine

## 2019-09-30 ENCOUNTER — Emergency Department: Payer: BLUE CROSS/BLUE SHIELD

## 2019-09-30 ENCOUNTER — Emergency Department
Admission: EM | Admit: 2019-09-30 | Discharge: 2019-09-30 | Disposition: A | Discharge status: Home / Self Care | Payer: BLUE CROSS/BLUE SHIELD | Attending: Student in an Organized Health Care Education/Training Program | Admitting: Student in an Organized Health Care Education/Training Program

## 2019-09-30 DIAGNOSIS — Y33XXXA Other specified events, undetermined intent, initial encounter: Secondary | ICD-10-CM | POA: Diagnosis not present

## 2019-09-30 DIAGNOSIS — S4992XA Unspecified injury of left shoulder and upper arm, initial encounter: Secondary | ICD-10-CM | POA: Diagnosis present

## 2019-09-30 DIAGNOSIS — Y929 Unspecified place or not applicable: Secondary | ICD-10-CM | POA: Insufficient documentation

## 2019-09-30 DIAGNOSIS — Z79899 Other long term (current) drug therapy: Secondary | ICD-10-CM | POA: Diagnosis not present

## 2019-09-30 DIAGNOSIS — E039 Hypothyroidism, unspecified: Secondary | ICD-10-CM | POA: Diagnosis not present

## 2019-09-30 DIAGNOSIS — Y999 Unspecified external cause status: Secondary | ICD-10-CM | POA: Insufficient documentation

## 2019-09-30 DIAGNOSIS — I10 Essential (primary) hypertension: Secondary | ICD-10-CM | POA: Diagnosis not present

## 2019-09-30 DIAGNOSIS — F17210 Nicotine dependence, cigarettes, uncomplicated: Secondary | ICD-10-CM | POA: Diagnosis not present

## 2019-09-30 DIAGNOSIS — S46012A Strain of muscle(s) and tendon(s) of the rotator cuff of left shoulder, initial encounter: Secondary | ICD-10-CM | POA: Insufficient documentation

## 2019-09-30 DIAGNOSIS — Y939 Activity, unspecified: Secondary | ICD-10-CM | POA: Diagnosis not present

## 2019-09-30 MED ORDER — MELOXICAM 15 MG PO TABS: 15.0000 mg | ORAL_TABLET | Freq: Every day | ORAL | 0 refills | Status: DC

## 2019-09-30 MED ORDER — TRAMADOL HCL 50 MG PO TABS: 50.0000 mg | ORAL_TABLET | Freq: Four times a day (QID) | ORAL | 0 refills | Status: DC | PRN

## 2019-09-30 MED ORDER — TIZANIDINE HCL 4 MG PO TABS: 4.0000 mg | ORAL_TABLET | Freq: Three times a day (TID) | ORAL | 0 refills | Status: AC | PRN

## 2019-09-30 NOTE — Discharge Instructions (Signed)
Wear your sling when out of bed for the next few days.  Follow up with PCP if not improving over the next few days.

## 2019-09-30 NOTE — ED Triage Notes (Signed)
L shoulder pain with limited ROM and increased pain with movement since yesterday. No known injury.

## 2019-09-30 NOTE — ED Provider Notes (Signed)
[facility] Emergency Department Provider Note ____________________________________________  Time seen: Approximately 8:38 PM  I have reviewed the triage vital signs and the nursing notes.   HISTORY  Chief Complaint Shoulder Pain    HPI Lynn Wilson is a 38 y.o. female who presents to the emergency department for evaluation and treatment of left shoulder pain. 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 Pain started yesterday and was worse upon awakening this morning. No relief with muscle relaxer and ibuprofen.   Past Medical History:  Diagnosis Date  . GERD (gastroesophageal reflux disease)   . Hypertension   . Hypothyroid   . Hypothyroid 01/25/2018  . Iron deficiency anemia due to chronic blood loss 02/16/2018    Patient Active Problem List   Diagnosis Date Noted  . Chronic left shoulder pain 02/22/2019  . Chronic gastritis without bleeding 02/22/2019  . Menorrhagia with regular cycle 05/10/2018  . Internal bleeding hemorrhoids 05/10/2018  . Migraine without aura and responsive to treatment 02/16/2018  . Iron deficiency anemia due to chronic blood loss 02/16/2018  . Hyperglycemia 01/29/2018  . Metabolic syndrome [id number]  . Hypertension 01/25/2018  . GERD (gastroesophageal reflux disease) 01/25/2018  . Hypothyroid 01/25/2018  . Morbid obesity ([place]) 01/25/2018    Past Surgical History:  Procedure Laterality Date  . COLONOSCOPY WITH PROPOFOL N/A 09/08/2018   Procedure: COLONOSCOPY WITH PROPOFOL;  Surgeon: Lin Landsman, MD;  Location: [facility] ENDOSCOPY;  Service: Gastroenterology;  Laterality: N/A;  . ESOPHAGOGASTRODUODENOSCOPY (EGD) WITH PROPOFOL N/A 09/08/2018   Procedure: ESOPHAGOGASTRODUODENOSCOPY (EGD) WITH PROPOFOL;  Surgeon: Lin Landsman, MD;  Location: [facility] ENDOSCOPY;  Service: Gastroenterology;  Laterality: N/A;    Prior to Admission medications   Medication Sig Start Date End Date Taking?  Authorizing Provider  amLODipine (NORVASC) 5 MG tablet Take 1 tablet (5 mg total) by mouth daily. 08/07/19   Hubbard Hartshorn, FNP  Cetirizine HCl 10 MG CAPS Take 1 capsule (10 mg total) by mouth daily. 03/27/18   Deborah Dondero, Johnette Abraham B, FNP  cholecalciferol (VITAMIN D) 1000 units tablet Take 2,000 Units by mouth daily.    [provider]  dicyclomine (BENTYL) 20 MG tablet Take 1 tablet (20 mg total) by mouth 4 (four) times daily -  before meals and at bedtime. 08/25/19   Steele Sizer, MD  hydrochlorothiazide (HYDRODIURIL) 12.5 MG tablet Take 1 tablet (12.5 mg total) by mouth daily. 08/07/19   Hubbard Hartshorn, FNP  levothyroxine (SYNTHROID) 100 MCG tablet Take 1 tablet (100 mcg total) by mouth every morning. 08/25/19   Steele Sizer, MD  medroxyPROGESTERone (DEPO-PROVERA) 150 MG/ML injection Inject 1 mL (150 mg total) into the muscle every 3 (three) months. Patient not taking: Reported on 08/25/2019 05/10/18   Steele Sizer, MD  meloxicam (MOBIC) 15 MG tablet Take 1 tablet (15 mg total) by mouth daily. 09/30/19   Hopelynn Gartland, Johnette Abraham B, FNP  NIFEdipine POWD 1 each by Does not apply route 2 (two) times daily. 08/25/19   Steele Sizer, MD  pantoprazole (PROTONIX) 40 MG tablet Take 1 tablet (40 mg total) by mouth 2 (two) times daily before a meal. 08/25/19   Ancil Boozer, Drue Stager, MD  SUMAtriptan (IMITREX) 100 MG tablet Take 1 tablet (100 mg total) by mouth every 2 (two) hours as needed for migraine. May repeat in 2 hours if headache persists or recurs. Patient not taking: Reported on 08/25/2019 02/22/19   Steele Sizer, MD  tiZANidine (ZANAFLEX) 4 MG tablet Take 1 tablet (  4 mg total) by mouth 3 (three) times daily as needed for up to 14 days for muscle spasms. 09/30/19 10/14/19  Darilyn Storbeck, Dessa Phi, FNP  traMADol (ULTRAM) 50 MG tablet Take 1 tablet (50 mg total) by mouth every 6 (six) hours as needed. 09/30/19   Cletis Muma, Johnette Abraham B, FNP    Allergies Celebrex [celecoxib], Sesame seed (diagnostic), and  Lisinopril  Family History  Problem Relation Age of Onset  . Hypertension Mother   . Hypertension Father   . Stroke Father     Social History Social History   Tobacco Use  . Smoking status: Current Every Day Smoker    Packs/day: 0.50    Years: 19.00    Pack years: 9.50    Types: Cigarettes    Start date: 01/26/2000  . Smokeless tobacco: Never Used  . Tobacco comment: contemplating   Substance Use Topics  . Alcohol use: Yes    Alcohol/week: 2.0 standard drinks    Types: 2 Cans of beer per week  . Drug use: No    Review of Systems Constitutional: Negative for fever. Cardiovascular: Negative for chest pain. Respiratory: Negative for shortness of breath. Musculoskeletal: Positive for left shoulder pain Skin: Negative for open wound or lesion.  Neurological: Negative for decrease in sensation  ____________________________________________   PHYSICAL EXAM:  VITAL SIGNS: ED Triage Vitals  Enc Vitals Group     BP 09/30/19 1444 (!) 159/100     Pulse Rate 09/30/19 1444 78     Resp 09/30/19 1444 18     Temp 09/30/19 1444 98.5 F (36.9 C)     Temp Source 09/30/19 1444 Oral     SpO2 09/30/19 1444 99 %     Weight 09/30/19 1447 236 lb (107 kg)     Height 09/30/19 1447 5\' 5"  (1.651 m)     Head Circumference --      Peak Flow --      Pain Score 09/30/19 1446 8     Pain Loc --      Pain Edu? --      Excl. in [place]? --     Constitutional: Alert and oriented. Well appearing and in no acute distress. Eyes: Conjunctivae are clear without discharge or drainage Head: Atraumatic Neck: Supple.  No focal midline tenderness. Respiratory: No cough. Respirations are even and unlabored. Musculoskeletal: Left shoulder pain increases with attempt to AB duct the arm.  No focal tenderness over the shoulder.  No step-off or deformity.  No pain in the left elbow, wrist, or hand. Neurologic: Grip strength is equal.  Motor and sensory function is intact. Skin: Closed.  No open wounds or  lesions. Psychiatric: Affect and behavior are appropriate.  ____________________________________________   LABS (all labs ordered are listed, but only abnormal results are displayed)  Labs Reviewed - No data to display ____________________________________________  RADIOLOGY  Image of the left shoulder is negative for acute findings per radiology. ____________________________________________   PROCEDURES  Procedures  ____________________________________________   INITIAL IMPRESSION / ASSESSMENT AND PLAN / ED COURSE  Lynn Wilson is a 38 y.o. who presents to the emergency department for treatment and evaluation of left shoulder pain.  Images reassuring.  Patient to be discharged home and is to follow-up with her primary care provider or orthopedics for symptoms are not improving over the next 2 days.  She was placed in a sling and given prescriptions for tizanidine, Mobic, and 12 tablets of tramadol.  Medications - No data to display  Pertinent  labs & imaging results that were available during my care of the patient were reviewed by me and considered in my medical decision making (see chart for details).  _________________________________________   FINAL CLINICAL IMPRESSION(S) / ED DIAGNOSES  Final diagnoses:  Rotator cuff strain, left, initial encounter    ED Discharge Orders         Ordered    tiZANidine (ZANAFLEX) 4 MG tablet  3 times daily PRN     09/30/19 1654    meloxicam (MOBIC) 15 MG tablet  Daily     09/30/19 1654    traMADol (ULTRAM) 50 MG tablet  Every 6 hours PRN     09/30/19 1654           If controlled substance prescribed during this visit, 12 month history viewed on the [place] prior to issuing an initial prescription for Schedule II or III opiod.   Victorino Dike, FNP 09/30/19 2042    Merlyn Lot, MD 09/30/19 2217

## 2019-10-16 ENCOUNTER — Encounter: Payer: Self-pay | Admitting: Emergency Medicine

## 2019-10-16 ENCOUNTER — Emergency Department
Admission: EM | Admit: 2019-10-16 | Discharge: 2019-10-16 | Disposition: A | Discharge status: Home / Self Care | Payer: BLUE CROSS/BLUE SHIELD | Attending: Emergency Medicine | Admitting: Emergency Medicine

## 2019-10-16 ENCOUNTER — Other Ambulatory Visit: Payer: Self-pay

## 2019-10-16 DIAGNOSIS — E039 Hypothyroidism, unspecified: Secondary | ICD-10-CM | POA: Insufficient documentation

## 2019-10-16 DIAGNOSIS — Z888 Allergy status to other drugs, medicaments and biological substances status: Secondary | ICD-10-CM | POA: Insufficient documentation

## 2019-10-16 DIAGNOSIS — Z886 Allergy status to analgesic agent status: Secondary | ICD-10-CM | POA: Diagnosis not present

## 2019-10-16 DIAGNOSIS — I10 Essential (primary) hypertension: Secondary | ICD-10-CM | POA: Diagnosis not present

## 2019-10-16 DIAGNOSIS — Z79899 Other long term (current) drug therapy: Secondary | ICD-10-CM | POA: Insufficient documentation

## 2019-10-16 DIAGNOSIS — K2920 Alcoholic gastritis without bleeding: Secondary | ICD-10-CM | POA: Insufficient documentation

## 2019-10-16 DIAGNOSIS — F17210 Nicotine dependence, cigarettes, uncomplicated: Secondary | ICD-10-CM | POA: Insufficient documentation

## 2019-10-16 DIAGNOSIS — R300 Dysuria: Secondary | ICD-10-CM | POA: Insufficient documentation

## 2019-10-16 DIAGNOSIS — R1013 Epigastric pain: Secondary | ICD-10-CM | POA: Diagnosis present

## 2019-10-16 LAB — URINALYSIS, COMPLETE (UACMP) WITH MICROSCOPIC
Bacteria, UA: NONE SEEN
Bilirubin Urine: NEGATIVE
Glucose, UA: NEGATIVE mg/dL
Hgb urine dipstick: NEGATIVE
Ketones, ur: NEGATIVE mg/dL
Leukocytes,Ua: NEGATIVE
Nitrite: NEGATIVE
Protein, ur: NEGATIVE mg/dL
Specific Gravity, Urine: 1.017 (ref 1.005–1.030)
pH: 5 (ref 5.0–8.0)

## 2019-10-16 LAB — CBC
HCT: 38 % (ref 36.0–46.0)
Hemoglobin: 12.1 g/dL (ref 12.0–15.0)
MCH: 24.2 pg — ABNORMAL LOW (ref 26.0–34.0)
MCHC: 31.8 g/dL (ref 30.0–36.0)
MCV: 76 fL — ABNORMAL LOW (ref 80.0–100.0)
Platelets: 316 10*3/uL (ref 150–400)
RBC: 5 MIL/uL (ref 3.87–5.11)
RDW: 16.2 % — ABNORMAL HIGH (ref 11.5–15.5)
WBC: 8 10*3/uL (ref 4.0–10.5)
nRBC: 0 % (ref 0.0–0.2)

## 2019-10-16 LAB — COMPREHENSIVE METABOLIC PANEL
ALT: 24 U/L (ref 0–44)
AST: 33 U/L (ref 15–41)
Albumin: 4.1 g/dL (ref 3.5–5.0)
Alkaline Phosphatase: 76 U/L (ref 38–126)
Anion gap: 11 (ref 5–15)
BUN: 12 mg/dL (ref 6–20)
CO2: 22 mmol/L (ref 22–32)
Calcium: 8.8 mg/dL — ABNORMAL LOW (ref 8.9–10.3)
Chloride: 102 mmol/L (ref 98–111)
Creatinine, Ser: 0.67 mg/dL (ref 0.44–1.00)
GFR calc Af Amer: >60 mL/min (ref >60)
GFR calc non Af Amer: >60 mL/min (ref >60)
Glucose, Bld: 108 mg/dL — ABNORMAL HIGH (ref 70–99)
Potassium: 4 mmol/L (ref 3.5–5.1)
Sodium: 135 mmol/L (ref 135–145)
Total Bilirubin: 0.5 mg/dL (ref 0.3–1.2)
Total Protein: 8.2 g/dL — ABNORMAL HIGH (ref 6.5–8.1)

## 2019-10-16 LAB — LIPASE, BLOOD: Lipase: 34 U/L (ref 11–51)

## 2019-10-16 LAB — POCT PREGNANCY, URINE: Preg Test, Ur: NEGATIVE

## 2019-10-16 MED ORDER — ALUM & MAG HYDROXIDE-SIMETH 200-200-20 MG/5ML PO SUSP
30.0000 mL | Freq: Once | ORAL | Status: AC
  Administered 2019-10-16: 12:00:00 30 mL via ORAL
  Filled 2019-10-16: qty 30

## 2019-10-16 MED ORDER — SUCRALFATE 1 G PO TABS: 1.0000 g | ORAL_TABLET | Freq: Four times a day (QID) | ORAL | 0 refills | Status: DC

## 2019-10-16 MED ORDER — LIDOCAINE VISCOUS HCL 2 % MT SOLN
15.0000 mL | Freq: Once | OROMUCOSAL | Status: AC
  Administered 2019-10-16: 15 mL via ORAL
  Filled 2019-10-16: qty 15

## 2019-10-16 NOTE — ED Provider Notes (Signed)
[facility] Emergency Department Provider Note   ____________________________________________    I have reviewed the triage vital signs and the nursing notes.   HISTORY  Chief Complaint Abdominal Pain, Flank Pain, and Dysuria     HPI Lynn Wilson is a 38 y.o. female with a history as noted below who presents with complaints of epigastric pain.  Patient reports she has had pain for about 2 days.  She does admit to drinking significant bouts of alcohol prior to this pain developing.  She describes a burning discomfort in her epigastrium, no significant radiation.  She does not take anything for this.  The pain is been constant.  Seems to get little worse with eating.  No vomiting.  Normal stools.  Does have a history of GERD  Past Medical History:  Diagnosis Date   GERD (gastroesophageal reflux disease)    Hypertension    Hypothyroid    Hypothyroid 01/25/2018   Iron deficiency anemia due to chronic blood loss 02/16/2018    Patient Active Problem List   Diagnosis Date Noted   Chronic left shoulder pain 02/22/2019   Chronic gastritis without bleeding 02/22/2019   Menorrhagia with regular cycle 05/10/2018   Internal bleeding hemorrhoids 05/10/2018   Migraine without aura and responsive to treatment 02/16/2018   Iron deficiency anemia due to chronic blood loss 02/16/2018   Hyperglycemia [id number]   Metabolic syndrome [id number]   Hypertension 01/25/2018   GERD (gastroesophageal reflux disease) 01/25/2018   Hypothyroid 01/25/2018   Morbid obesity ([place]) 01/25/2018    Past Surgical History:  Procedure Laterality Date   COLONOSCOPY WITH PROPOFOL N/A 09/08/2018   Procedure: COLONOSCOPY WITH PROPOFOL;  Surgeon: Lin Landsman, MD;  Location: [facility];  Service: Gastroenterology;  Laterality: N/A;   ESOPHAGOGASTRODUODENOSCOPY (EGD) WITH PROPOFOL N/A 09/08/2018   Procedure: ESOPHAGOGASTRODUODENOSCOPY (EGD) WITH PROPOFOL;   Surgeon: Lin Landsman, MD;  Location: [facility] ENDOSCOPY;  Service: Gastroenterology;  Laterality: N/A;    Prior to Admission medications   Medication Sig Start Date End Date Taking? Authorizing Provider  amLODipine (NORVASC) 5 MG tablet Take 1 tablet (5 mg total) by mouth daily. 08/07/19   Hubbard Hartshorn, FNP  Cetirizine HCl 10 MG CAPS Take 1 capsule (10 mg total) by mouth daily. 03/27/18   Triplett, Johnette Abraham B, FNP  cholecalciferol (VITAMIN D) 1000 units tablet Take 2,000 Units by mouth daily.    [provider]  dicyclomine (BENTYL) 20 MG tablet Take 1 tablet (20 mg total) by mouth 4 (four) times daily -  before meals and at bedtime. 08/25/19   Steele Sizer, MD  hydrochlorothiazide (HYDRODIURIL) 12.5 MG tablet Take 1 tablet (12.5 mg total) by mouth daily. 08/07/19   Hubbard Hartshorn, FNP  levothyroxine (SYNTHROID) 100 MCG tablet Take 1 tablet (100 mcg total) by mouth every morning. 08/25/19   Steele Sizer, MD  meloxicam (MOBIC) 15 MG tablet Take 1 tablet (15 mg total) by mouth daily. 09/30/19   Triplett, Johnette Abraham B, FNP  NIFEdipine POWD 1 each by Does not apply route 2 (two) times daily. 08/25/19   Steele Sizer, MD  pantoprazole (PROTONIX) 40 MG tablet Take 1 tablet (40 mg total) by mouth 2 (two) times daily before a meal. 08/25/19   Sowles, Drue Stager, MD  sucralfate (CARAFATE) 1 g tablet Take 1 tablet (1 g total) by mouth 4 (four) times daily for 5 days. 10/16/19 10/21/19  Lavonia Drafts, MD  traMADol (ULTRAM) 50 MG tablet Take 1 tablet (50 mg total)  by mouth every 6 (six) hours as needed. 09/30/19   Triplett, Johnette Abraham B, FNP     Allergies Celebrex [celecoxib], Sesame seed (diagnostic), and Lisinopril  Family History  Problem Relation Age of Onset   Hypertension Mother    Hypertension Father    Stroke Father     Social History Social History   Tobacco Use   Smoking status: Current Every Day Smoker    Packs/day: 0.50    Years: 19.00    Pack years: 9.50    Types: Cigarettes     Start date: 01/26/2000   Smokeless tobacco: Never Used   Tobacco comment: contemplating   Substance Use Topics   Alcohol use: Yes    Alcohol/week: 2.0 standard drinks    Types: 2 Cans of beer per week   Drug use: No    Review of Systems  Constitutional: No dizziness Eyes: No visual changes.  ENT: No burning in the throat Cardiovascular: Denies chest pain. Respiratory: Denies shortness of breath. Gastrointestinal: As above Genitourinary: Negative for dysuria. Musculoskeletal: Negative for back pain. Skin: Negative for rash. Neurological: Negative for headaches   ____________________________________________   PHYSICAL EXAM:  VITAL SIGNS: ED Triage Vitals  Enc Vitals Group     BP 10/16/19 1003 (!) 149/99     Pulse Rate 10/16/19 1003 74     Resp 10/16/19 1003 20     Temp 10/16/19 1003 97.9 F (36.6 C)     Temp Source 10/16/19 1003 Oral     SpO2 10/16/19 1003 99 %     Weight 10/16/19 1004 106.6 kg (235 lb)     Height 10/16/19 1004 1.651 m (5\' 5" )     Head Circumference --      Peak Flow --      Pain Score 10/16/19 1003 8     Pain Loc --      Pain Edu? --      Excl. in [place]? --     Constitutional: Alert and oriented.  Nose: No congestion/rhinnorhea. Mouth/Throat: Mucous membranes are moist.    Cardiovascular: Normal rate, regular rhythm. Grossly normal heart sounds.  Good peripheral circulation. Respiratory: Normal respiratory effort.  No retractions.  Gastrointestinal: Mild gastric tenderness, no distention, no CVA tenderness  Musculoskeletal: .  Warm and well perfused Neurologic:  Normal speech and language. No gross focal neurologic deficits are appreciated.  Skin:  Skin is warm, dry and intact. No rash noted. Psychiatric: Mood and affect are normal. Speech and behavior are normal.  ____________________________________________   LABS (all labs ordered are listed, but only abnormal results are displayed)  Labs Reviewed  COMPREHENSIVE METABOLIC PANEL -  Abnormal; Notable for the following components:      Result Value   Glucose, Bld 108 (*)    Calcium 8.8 (*)    Total Protein 8.2 (*)    All other components within normal limits  CBC - Abnormal; Notable for the following components:   MCV 76.0 (*)    MCH 24.2 (*)    RDW 16.2 (*)    All other components within normal limits  URINALYSIS, COMPLETE (UACMP) WITH MICROSCOPIC - Abnormal; Notable for the following components:   Color, Urine YELLOW (*)    APPearance CLEAR (*)    All other components within normal limits  LIPASE, BLOOD  POCT PREGNANCY, URINE   ____________________________________________  EKG  None ____________________________________________  RADIOLOGY  None ____________________________________________   PROCEDURES  Procedure(s) performed: No  Procedures   Critical Care performed: No ____________________________________________  INITIAL IMPRESSION / ASSESSMENT AND PLAN / ED COURSE  Pertinent labs & imaging results that were available during my care of the patient were reviewed by me and considered in my medical decision making (see chart for details).  Patient presents with epigastric discomfort as above, highly suspicious for gastritis given onset after a night of drinking alcohol.  Normal stools, no vomiting.  Lab work is overall reassuring.  Lipase is normal.  Will trial GI cocktail  Patient had near complete resolution of burning with GI cocktail, recommended Pepto-Bismol, anticipate improvement over the next 1 to 2 days, return precautions discussed.    ____________________________________________   FINAL CLINICAL IMPRESSION(S) / ED DIAGNOSES  Final diagnoses:  Acute alcoholic gastritis without hemorrhage        Note:  This document was prepared using Dragon voice recognition software and may include unintentional dictation errors.   Lavonia Drafts, MD 10/16/19 1309

## 2019-10-16 NOTE — ED Triage Notes (Signed)
Pt reports pain to the left side of her abdomen for a few days.  Pt also reports some dysuria. Pt describes the pain as throbbing.

## 2019-11-16 ENCOUNTER — Other Ambulatory Visit: Payer: Self-pay | Admitting: Physician Assistant

## 2019-11-16 DIAGNOSIS — M7542 Impingement syndrome of left shoulder: Secondary | ICD-10-CM

## 2019-11-29 ENCOUNTER — Ambulatory Visit
Admission: RE | Admit: 2019-11-29 | Discharge: 2019-11-29 | Disposition: A | Discharge status: Home / Self Care | Payer: BLUE CROSS/BLUE SHIELD | Source: Ambulatory Visit | Attending: Physician Assistant | Admitting: Physician Assistant

## 2019-11-29 ENCOUNTER — Other Ambulatory Visit: Payer: Self-pay

## 2019-11-29 DIAGNOSIS — M7542 Impingement syndrome of left shoulder: Secondary | ICD-10-CM

## 2019-11-29 MED ORDER — LIDOCAINE HCL (PF) 1 % IJ SOLN
10.0000 mL | Freq: Once | INTRAMUSCULAR | Status: AC
  Administered 2019-11-29: 10 mL via INTRADERMAL
  Filled 2019-11-29: qty 10

## 2019-11-29 MED ORDER — SODIUM CHLORIDE (PF) 0.9 % IJ SOLN
20.0000 mL | Freq: Once | INTRAMUSCULAR | Status: AC
  Administered 2019-11-29: 20 mL

## 2019-11-29 MED ORDER — IOHEXOL 180 MG/ML  SOLN
20.0000 mL | Freq: Once | INTRAMUSCULAR | Status: AC | PRN
  Administered 2019-11-29: 20 mL

## 2020-02-22 ENCOUNTER — Ambulatory Visit: Payer: BLUE CROSS/BLUE SHIELD | Admitting: Family Medicine

## 2020-04-18 ENCOUNTER — Other Ambulatory Visit: Payer: Self-pay

## 2020-04-18 DIAGNOSIS — R0789 Other chest pain: Secondary | ICD-10-CM | POA: Diagnosis not present

## 2020-04-18 DIAGNOSIS — F17210 Nicotine dependence, cigarettes, uncomplicated: Secondary | ICD-10-CM | POA: Insufficient documentation

## 2020-04-18 DIAGNOSIS — I10 Essential (primary) hypertension: Secondary | ICD-10-CM | POA: Diagnosis not present

## 2020-04-18 DIAGNOSIS — E039 Hypothyroidism, unspecified: Secondary | ICD-10-CM | POA: Diagnosis not present

## 2020-04-18 DIAGNOSIS — Z79899 Other long term (current) drug therapy: Secondary | ICD-10-CM | POA: Diagnosis not present

## 2020-04-18 LAB — COMPREHENSIVE METABOLIC PANEL
ALT: 46 U/L — ABNORMAL HIGH (ref 0–44)
AST: 36 U/L (ref 15–41)
Albumin: 3.9 g/dL (ref 3.5–5.0)
Alkaline Phosphatase: 103 U/L (ref 38–126)
Anion gap: 9 (ref 5–15)
BUN: 13 mg/dL (ref 6–20)
CO2: 24 mmol/L (ref 22–32)
Calcium: 9 mg/dL (ref 8.9–10.3)
Chloride: 103 mmol/L (ref 98–111)
Creatinine, Ser: 0.73 mg/dL (ref 0.44–1.00)
GFR calc Af Amer: >60 mL/min (ref >60)
GFR calc non Af Amer: >60 mL/min (ref >60)
Glucose, Bld: 99 mg/dL (ref 70–99)
Potassium: 3.6 mmol/L (ref 3.5–5.1)
Sodium: 136 mmol/L (ref 135–145)
Total Bilirubin: 0.4 mg/dL (ref 0.3–1.2)
Total Protein: 8.2 g/dL — ABNORMAL HIGH (ref 6.5–8.1)

## 2020-04-18 LAB — CBC
HCT: 34.2 % — ABNORMAL LOW (ref 36.0–46.0)
Hemoglobin: 10.9 g/dL — ABNORMAL LOW (ref 12.0–15.0)
MCH: 22.2 pg — ABNORMAL LOW (ref 26.0–34.0)
MCHC: 31.9 g/dL (ref 30.0–36.0)
MCV: 69.7 fL — ABNORMAL LOW (ref 80.0–100.0)
Platelets: 418 10*3/uL — ABNORMAL HIGH (ref 150–400)
RBC: 4.91 MIL/uL (ref 3.87–5.11)
RDW: 17.1 % — ABNORMAL HIGH (ref 11.5–15.5)
WBC: 8.2 10*3/uL (ref 4.0–10.5)
nRBC: 0 % (ref 0.0–0.2)

## 2020-04-18 LAB — TROPONIN I (HIGH SENSITIVITY): Troponin I (High Sensitivity): 3 ng/L (ref <18)

## 2020-04-18 NOTE — ED Triage Notes (Signed)
Pt in with co midsternal chest pain that started on Tuesday, hx of reflux but states feels different.

## 2020-04-19 ENCOUNTER — Emergency Department: Payer: BLUE CROSS/BLUE SHIELD

## 2020-04-19 ENCOUNTER — Emergency Department
Admission: EM | Admit: 2020-04-19 | Discharge: 2020-04-19 | Disposition: A | Discharge status: Home / Self Care | Payer: BLUE CROSS/BLUE SHIELD | Attending: Emergency Medicine | Admitting: Emergency Medicine

## 2020-04-19 DIAGNOSIS — R0789 Other chest pain: Secondary | ICD-10-CM

## 2020-04-19 LAB — TROPONIN I (HIGH SENSITIVITY): Troponin I (High Sensitivity): 3 ng/L (ref <18)

## 2020-04-19 NOTE — Discharge Instructions (Addendum)
You have been seen in the Emergency Department (ED) today for chest pain.  As we have discussed today's test results are normal, and we believe your pain is due to pain/strain and/or inflammation of the muscles and/or cartilage of your chest wall.  We recommend you take ibuprofen 600 mg three times a day with meals for the next 5 days (unless you have been told previously not to take ibuprofen or NSAIDs in general).  You may also take Tylenol according to the label instructions.  Read through the included information for additional treatment recommendations and precautions.  Continue to take your regular medications.   Return to the Emergency Department (ED) if you experience any further chest pain/pressure/tightness, difficulty breathing, or sudden sweating, or other symptoms that concern you.

## 2020-04-19 NOTE — ED Provider Notes (Signed)
[facility] Emergency Department Provider Note  ____________________________________________   First MD Initiated Contact with Patient 04/19/20 [phone]     (approximate)  I have reviewed the triage vital signs and the nursing notes.   HISTORY  Chief Complaint Chest Pain    HPI Lynn Wilson is a 39 y.o. female with medical history as listed below who presents for evaluation of central chest pain that has been going on for 3 to 4 days.  She said that first she thought it was her reflux but it has continued and does not seem to be getting better with Protonix although it does improve slightly for short period of time.  Over time she has realized that it hurts more when she takes a deep breath and when she moves around in certain positions.  The pain seems to be located primarily in the center of her chest and she can reproduce it with palpation but also radiates somewhat along the side of her chest and a little bit around to the back.  She has not had any trauma, no rash, no fever, no difficulty breathing (just some discomfort with taking deep breaths).  She denies sore throat, nausea, vomiting, lower abdominal pain, and dysuria.  Nothing particular makes it better.  She has no history of heart disease or blood clots in the legs of the lungs.         Past Medical History:  Diagnosis Date  . GERD (gastroesophageal reflux disease)   . Hypertension   . Hypothyroid   . Hypothyroid 01/25/2018  . Iron deficiency anemia due to chronic blood loss 02/16/2018    Patient Active Problem List   Diagnosis Date Noted  . Chronic left shoulder pain 02/22/2019  . Chronic gastritis without bleeding 02/22/2019  . Menorrhagia with regular cycle 05/10/2018  . Internal bleeding hemorrhoids 05/10/2018  . Migraine without aura and responsive to treatment 02/16/2018  . Iron deficiency anemia due to chronic blood loss 02/16/2018  . Hyperglycemia 01/29/2018  . Metabolic syndrome  [id number]  . Hypertension 01/25/2018  . GERD (gastroesophageal reflux disease) 01/25/2018  . Hypothyroid 01/25/2018  . Morbid obesity ([place]) 01/25/2018    Past Surgical History:  Procedure Laterality Date  . COLONOSCOPY WITH PROPOFOL N/A 09/08/2018   Procedure: COLONOSCOPY WITH PROPOFOL;  Surgeon: Lin Landsman, MD;  Location: [facility] ENDOSCOPY;  Service: Gastroenterology;  Laterality: N/A;  . ESOPHAGOGASTRODUODENOSCOPY (EGD) WITH PROPOFOL N/A 09/08/2018   Procedure: ESOPHAGOGASTRODUODENOSCOPY (EGD) WITH PROPOFOL;  Surgeon: Lin Landsman, MD;  Location: [facility] ENDOSCOPY;  Service: Gastroenterology;  Laterality: N/A;    Prior to Admission medications   Medication Sig Start Date End Date Taking? Authorizing Provider  amLODipine (NORVASC) 5 MG tablet Take 1 tablet (5 mg total) by mouth daily. 08/07/19   Hubbard Hartshorn, FNP  Cetirizine HCl 10 MG CAPS Take 1 capsule (10 mg total) by mouth daily. 03/27/18   Triplett, Johnette Abraham B, FNP  cholecalciferol (VITAMIN D) 1000 units tablet Take 2,000 Units by mouth daily.    [provider]  dicyclomine (BENTYL) 20 MG tablet Take 1 tablet (20 mg total) by mouth 4 (four) times daily -  before meals and at bedtime. 08/25/19   Steele Sizer, MD  hydrochlorothiazide (HYDRODIURIL) 12.5 MG tablet Take 1 tablet (12.5 mg total) by mouth daily. 08/07/19   Hubbard Hartshorn, FNP  levothyroxine (SYNTHROID) 100 MCG tablet Take 1 tablet (100 mcg total) by mouth every morning. 08/25/19   Steele Sizer, MD  meloxicam [facility])  15 MG tablet Take 1 tablet (15 mg total) by mouth daily. 09/30/19   Triplett, Johnette Abraham B, FNP  NIFEdipine POWD 1 each by Does not apply route 2 (two) times daily. 08/25/19   Steele Sizer, MD  pantoprazole (PROTONIX) 40 MG tablet Take 1 tablet (40 mg total) by mouth 2 (two) times daily before a meal. 08/25/19   Sowles, Drue Stager, MD  sucralfate (CARAFATE) 1 g tablet Take 1 tablet (1 g total) by mouth 4 (four) times daily for 5 days. 10/16/19 10/21/19   Lavonia Drafts, MD  traMADol (ULTRAM) 50 MG tablet Take 1 tablet (50 mg total) by mouth every 6 (six) hours as needed. 09/30/19   Triplett, Johnette Abraham B, FNP    Allergies Celebrex [celecoxib], Sesame seed (diagnostic), and Lisinopril  Family History  Problem Relation Age of Onset  . Hypertension Mother   . Hypertension Father   . Stroke Father     Social History Social History   Tobacco Use  . Smoking status: Current Every Day Smoker    Packs/day: 0.50    Years: 19.00    Pack years: 9.50    Types: Cigarettes    Start date: 01/26/2000  . Smokeless tobacco: Never Used  . Tobacco comment: contemplating   Substance Use Topics  . Alcohol use: Yes    Alcohol/week: 2.0 standard drinks    Types: 2 Cans of beer per week  . Drug use: No    Review of Systems Constitutional: No fever/chills Eyes: No visual changes. ENT: No sore throat. Cardiovascular: +chest pain. Respiratory: Denies shortness of breath.  Some pain with deep inspiration. Gastrointestinal: No abdominal pain.  No nausea, no vomiting.  No diarrhea.  No constipation. Genitourinary: Negative for dysuria. Musculoskeletal: Negative for neck pain.  Negative for back pain. Integumentary: Negative for rash. Neurological: Negative for headaches, focal weakness or numbness.   ____________________________________________   PHYSICAL EXAM:  VITAL SIGNS: ED Triage Vitals  Enc Vitals Group     BP 04/18/20 2319 (!) 162/102     Pulse Rate 04/18/20 2319 89     Resp 04/18/20 2319 20     Temp 04/18/20 2319 97.6 F (36.4 C)     Temp Source 04/18/20 2319 Oral     SpO2 04/18/20 2319 100 %     Weight 04/18/20 2317 107 kg (236 lb)     Height 04/18/20 2317 1.626 m (5\' 4" )     Head Circumference --      Peak Flow --      Pain Score 04/18/20 2317 7     Pain Loc --      Pain Edu? --      Excl. in [place]? --     Constitutional: Alert and oriented.  Eyes: Conjunctivae are normal.  Head: Atraumatic. Nose: No  congestion/rhinnorhea. Mouth/Throat: Patient is wearing a mask. Neck: No stridor.  No meningeal signs.   Cardiovascular: Normal rate, regular rhythm. Good peripheral circulation. Grossly normal heart sounds. Respiratory: Normal respiratory effort.  No retractions. Gastrointestinal: Soft and nontender. No distention.  Musculoskeletal: Reproducible chest wall tenderness to palpation in the central anterior chest.  No lower extremity tenderness nor edema. No gross deformities of extremities. Neurologic:  Normal speech and language. No gross focal neurologic deficits are appreciated.  Skin:  Skin is warm, dry and intact. Psychiatric: Mood and affect are normal. Speech and behavior are normal.  ____________________________________________   LABS (all labs ordered are listed, but only abnormal results are displayed)  Labs Reviewed  CBC -  Abnormal; Notable for the following components:      Result Value   Hemoglobin 10.9 (*)    HCT 34.2 (*)    MCV 69.7 (*)    MCH 22.2 (*)    RDW 17.1 (*)    Platelets 418 (*)    All other components within normal limits  COMPREHENSIVE METABOLIC PANEL - Abnormal; Notable for the following components:   Total Protein 8.2 (*)    ALT 46 (*)    All other components within normal limits  TROPONIN I (HIGH SENSITIVITY)  TROPONIN I (HIGH SENSITIVITY)   ____________________________________________  EKG  ED ECG REPORT I, Hinda Kehr, the attending physician, personally viewed and interpreted this ECG.  Date: 04/18/2020 EKG Time: 23: 16 Rate: 92 Rhythm: normal sinus rhythm QRS Axis: normal Intervals: Incomplete right bundle branch block ST/T Wave abnormalities: normal Narrative Interpretation: no evidence of acute ischemia  ____________________________________________  RADIOLOGY I, Hinda Kehr, personally viewed and evaluated these images (plain radiographs) as part of my medical decision making, as well as reviewing the written report by the  radiologist.  ED MD interpretation: No acute abnormalities on chest x-rays  Official radiology report(s): DG Chest 2 View  Result Date: 04/19/2020 CLINICAL DATA:  Midsternal chest pain began 2 state, history of reflux though states this feels different. EXAM: CHEST - 2 VIEW COMPARISON:  Radiograph 04/27/2017 FINDINGS: No consolidation, features of edema, pneumothorax, or effusion. Pulmonary vascularity is normally distributed. The cardiomediastinal contours are unremarkable. No acute osseous or soft tissue abnormality. IMPRESSION: No acute cardiopulmonary abnormality. Electronically Signed   By: Lovena Le M.D.   On: 04/19/2020 04:15    ____________________________________________   PROCEDURES   Procedure(s) performed (including Critical Care):  Procedures   ____________________________________________   INITIAL IMPRESSION / MDM / [place] / ED COURSE  As part of my medical decision making, I reviewed the following data within the [place] History obtained from family, Nursing notes reviewed and incorporated, Labs reviewed , EKG interpreted , Old chart reviewed, Radiograph reviewed  and Notes from prior ED visits   Differential diagnosis includes, but is not limited to, costochondritis, musculoskeletal strain, ACS, PE, pneumothorax, pneumonia, COVID-19.  The patient has been having symptoms for 3 to 4 days and it is reproducible with palpation.  Her chest x-ray and EKG showed no acute abnormalities.  Vital signs are stable other than hypertension.  She has a normal comprehensive metabolic panel other than a very slight elevation of her ALT which is just barely above the upper limit of normal and she has a normal CBC.  High-sensitivity troponin was 3 which is reassuring.  She is PERC negative and low risk for ACS based on HEAR score.  I provided reassurance and encouraged her to continue taking her Protonix.  I explained that I do not believe she has an  emergent medical condition at this time and that I think most likely she is suffering from musculoskeletal chest wall pain.  Since she is protecting her stomach through the use of a PPI, I encouraged her to try several days of ibuprofen with meals and to follow-up with her primary care doctor.  I gave my usual and customary return precautions and she understands and agrees with the plan.           ____________________________________________  FINAL CLINICAL IMPRESSION(S) / ED DIAGNOSES  Final diagnoses:  Chest wall pain     MEDICATIONS GIVEN DURING THIS VISIT:  Medications - No data to display  ED Discharge Orders    None      *Please note:  Heida Foppiano Stanbrough was evaluated in Emergency Department on 04/19/2020 for the symptoms described in the history of present illness. She was evaluated in the context of the global COVID-19 pandemic, which necessitated consideration that the patient might be at risk for infection with the SARS-CoV-2 virus that causes COVID-19. Institutional protocols and algorithms that pertain to the evaluation of patients at risk for COVID-19 are in a state of rapid change based on information released by regulatory bodies including the CDC and federal and state organizations. These policies and algorithms were followed during the patient's care in the ED.  Some ED evaluations and interventions may be delayed as a result of limited staffing during the pandemic.*  Note:  This document was prepared using Dragon voice recognition software and may include unintentional dictation errors.   Hinda Kehr, MD 04/19/20 [phone]

## 2020-06-09 ENCOUNTER — Other Ambulatory Visit: Payer: Self-pay

## 2020-06-09 ENCOUNTER — Emergency Department
Admission: EM | Admit: 2020-06-09 | Discharge: 2020-06-09 | Disposition: A | Discharge status: Home / Self Care | Payer: BLUE CROSS/BLUE SHIELD | Attending: Emergency Medicine | Admitting: Emergency Medicine

## 2020-06-09 DIAGNOSIS — I10 Essential (primary) hypertension: Secondary | ICD-10-CM | POA: Diagnosis not present

## 2020-06-09 DIAGNOSIS — Z79899 Other long term (current) drug therapy: Secondary | ICD-10-CM | POA: Insufficient documentation

## 2020-06-09 DIAGNOSIS — M436 Torticollis: Secondary | ICD-10-CM | POA: Diagnosis not present

## 2020-06-09 DIAGNOSIS — F17210 Nicotine dependence, cigarettes, uncomplicated: Secondary | ICD-10-CM | POA: Insufficient documentation

## 2020-06-09 DIAGNOSIS — M542 Cervicalgia: Secondary | ICD-10-CM | POA: Diagnosis present

## 2020-06-09 MED ORDER — TRAMADOL HCL 50 MG PO TABS: 50.0000 mg | ORAL_TABLET | Freq: Four times a day (QID) | ORAL | 0 refills | Status: DC | PRN

## 2020-06-09 MED ORDER — KETOROLAC TROMETHAMINE 30 MG/ML IJ SOLN
30.0000 mg | Freq: Once | INTRAMUSCULAR | Status: AC
  Administered 2020-06-09: 30 mg via INTRAMUSCULAR
  Filled 2020-06-09: qty 1

## 2020-06-09 MED ORDER — METHOCARBAMOL 500 MG PO TABS: ORAL_TABLET | ORAL | 0 refills | Status: DC

## 2020-06-09 NOTE — ED Triage Notes (Signed)
Pt c/o neck pain that radiates into the shoulder for the past 2 days , denies injury.

## 2020-06-09 NOTE — Discharge Instructions (Signed)
Follow-up with your primary care provider if any continued problems or concerns.  Begin using ice or heat to your neck as needed for discomfort.  You may try both alternating between the 2 or if ice works better than that he you may stay with 1.  Begin taking the Robaxin 1 or 2 tablets every 6 hours as needed for muscle spasms and tramadol as needed for pain.  The ibuprofen can be continued with both these medications.  You cannot drive or operate machinery while taking the Robaxin and tramadol.  It may take 2 to 3 days before you have full range of motion.

## 2020-06-09 NOTE — ED Provider Notes (Signed)
[facility] Emergency Department Provider Note   ____________________________________________   First MD Initiated Contact with Patient 06/09/20 [phone]     (approximate)  I have reviewed the triage vital signs and the nursing notes.   HISTORY  Chief Complaint Neck Pain   HPI Lynn Wilson is a 39 y.o. female presents to the ED with complaint of cervical pain that radiates down to the shoulder area for the last 2 days.  Patient denies any injury and states that she woke up with this pain.  She denies any previous neck problems.  She does not take any over-the-counter medication that has helped.  She denies any paresthesias into her upper extremities.  She rates her pain as 7 out of 10.     Past Medical History:  Diagnosis Date  . GERD (gastroesophageal reflux disease)   . Hypertension   . Hypothyroid   . Hypothyroid 01/25/2018  . Iron deficiency anemia due to chronic blood loss 02/16/2018    Patient Active Problem List   Diagnosis Date Noted  . Chronic left shoulder pain 02/22/2019  . Chronic gastritis without bleeding 02/22/2019  . Menorrhagia with regular cycle 05/10/2018  . Internal bleeding hemorrhoids 05/10/2018  . Migraine without aura and responsive to treatment 02/16/2018  . Iron deficiency anemia due to chronic blood loss 02/16/2018  . Hyperglycemia 01/29/2018  . Metabolic syndrome 27/25/3664  . Hypertension 01/25/2018  . GERD (gastroesophageal reflux disease) 01/25/2018  . Hypothyroid 01/25/2018  . Morbid obesity ([place]) 01/25/2018    Past Surgical History:  Procedure Laterality Date  . COLONOSCOPY WITH PROPOFOL N/A 09/08/2018   Procedure: COLONOSCOPY WITH PROPOFOL;  Surgeon: Lin Landsman, MD;  Location: [facility] ENDOSCOPY;  Service: Gastroenterology;  Laterality: N/A;  . ESOPHAGOGASTRODUODENOSCOPY (EGD) WITH PROPOFOL N/A 09/08/2018   Procedure: ESOPHAGOGASTRODUODENOSCOPY (EGD) WITH PROPOFOL;  Surgeon: Lin Landsman, MD;   Location: [facility] ENDOSCOPY;  Service: Gastroenterology;  Laterality: N/A;    Prior to Admission medications   Medication Sig Start Date End Date Taking? Authorizing Provider  amLODipine (NORVASC) 5 MG tablet Take 1 tablet (5 mg total) by mouth daily. 08/07/19   Hubbard Hartshorn, FNP  cholecalciferol (VITAMIN D) 1000 units tablet Take 2,000 Units by mouth daily.    [provider]  dicyclomine (BENTYL) 20 MG tablet Take 1 tablet (20 mg total) by mouth 4 (four) times daily -  before meals and at bedtime. 08/25/19   Steele Sizer, MD  hydrochlorothiazide (HYDRODIURIL) 12.5 MG tablet Take 1 tablet (12.5 mg total) by mouth daily. 08/07/19   Hubbard Hartshorn, FNP  levothyroxine (SYNTHROID) 100 MCG tablet Take 1 tablet (100 mcg total) by mouth every morning. 08/25/19   Steele Sizer, MD  methocarbamol (ROBAXIN) 500 MG tablet 1 or 2 tablets every 6 hours as needed for muscle spasms. 06/09/20   Letitia Neri L, PA-C  NIFEdipine POWD 1 each by Does not apply route 2 (two) times daily. 08/25/19   Steele Sizer, MD  pantoprazole (PROTONIX) 40 MG tablet Take 1 tablet (40 mg total) by mouth 2 (two) times daily before a meal. 08/25/19   Sowles, Drue Stager, MD  sucralfate (CARAFATE) 1 g tablet Take 1 tablet (1 g total) by mouth 4 (four) times daily for 5 days. 10/16/19 10/21/19  Lavonia Drafts, MD  traMADol (ULTRAM) 50 MG tablet Take 1 tablet (50 mg total) by mouth every 6 (six) hours as needed for moderate pain. 06/09/20   Johnn Hai, PA-C  Cetirizine HCl 10 MG CAPS  Take 1 capsule (10 mg total) by mouth daily. 03/27/18 06/09/20  Sherrie George B, FNP    Allergies Celebrex [celecoxib], Sesame seed (diagnostic), and Lisinopril  Family History  Problem Relation Age of Onset  . Hypertension Mother   . Hypertension Father   . Stroke Father     Social History Social History   Tobacco Use  . Smoking status: Current Every Day Smoker    Packs/day: 0.50    Years: 19.00    Pack years: 9.50    Types:  Cigarettes    Start date: 01/26/2000  . Smokeless tobacco: Never Used  . Tobacco comment: contemplating   Vaping Use  . Vaping Use: Never used  Substance Use Topics  . Alcohol use: Yes    Alcohol/week: 2.0 standard drinks    Types: 2 Cans of beer per week  . Drug use: No    Review of Systems Constitutional: No fever/chills Eyes: No visual changes. ENT: No sore throat. Cardiovascular: Denies chest pain. Respiratory: Denies shortness of breath. Gastrointestinal:   No nausea, no vomiting.  Genitourinary: Negative for dysuria. Musculoskeletal: Positive for neck pain. Skin: Negative for rash. Neurological: Negative for headaches, focal weakness or numbness. ____________________________________________   PHYSICAL EXAM:  VITAL SIGNS: ED Triage Vitals  Enc Vitals Group     BP 06/09/20 0745 (!) 164/102     Pulse Rate 06/09/20 0745 79     Resp 06/09/20 0745 18     Temp 06/09/20 0745 98.1 F (36.7 C)     Temp src --      SpO2 06/09/20 0745 100 %     Weight 06/09/20 0748 235 lb 14.3 oz (107 kg)     Height 06/09/20 0748 5\' 4"  (1.626 m)     Head Circumference --      Peak Flow --      Pain Score 06/09/20 0730 7     Pain Loc --      Pain Edu? --      Excl. in [place]? --    Constitutional: Alert and oriented. Well appearing and in no acute distress. Eyes: Conjunctivae are normal. PERRL. EOMI. Head: Atraumatic. Neck: No stridor.  No point tenderness on palpation of cervical spine however patient does have moderate tenderness on palpation of the left trapezius muscle with decreased range of motion secondary to pain.  No soft tissue edema or evidence of injury.  Patient is able to flex and extend without pain. Cardiovascular: Normal rate, regular rhythm. Grossly normal heart sounds.  Good peripheral circulation. Respiratory: Normal respiratory effort.  No retractions. Lungs CTAB. Musculoskeletal: Patient is able move upper and lower extremities with any difficulty.  Normal gait was  noted.  Good muscle strength bilaterally especially with bilateral handgrip.  Skin is intact.  Motor sensory function intact.. Neurologic:  Normal speech and language. No gross focal neurologic deficits are appreciated. No gait instability. Skin:  Skin is warm, dry and intact. No rash noted. Psychiatric: Mood and affect are normal. Speech and behavior are normal.  ____________________________________________   LABS (all labs ordered are listed, but only abnormal results are displayed)  Labs Reviewed - No data to display  PROCEDURES  Procedure(s) performed (including Critical Care):  Procedures   ____________________________________________   INITIAL IMPRESSION / ASSESSMENT AND PLAN / ED COURSE  As part of my medical decision making, I reviewed the following data within the electronic MEDICAL RECORD NUMBER Notes from prior ED visits and [place] Controlled Substance Database  Patient presents to the ED  with complaint of cervical pain that radiates into her left shoulder and has restricted her range of motion for the last 2 days.  Patient states she woke that morning with the neck stiffness.  There is no history of injury.  Limited over-the-counter medication has been taken.  Patient drove herself to the ED and is aware that we cannot give her muscle relaxants while she is driving.  Patient was given a Toradol injection 30 mg IM and was getting relief prior to discharge.  Patient was made aware that she may continue her ibuprofen that she has at home.  A prescription for methocarbamol 500 mg 1 or 2 every 6 hours as needed for muscle spasms and tramadol as needed for pain.  She is aware that she cannot take these 2 medications and drive or operate machinery.  She is encouraged to use ice or heat to her neck as needed for discomfort.  If not improving she is encouraged to follow-up with her PCP.  ____________________________________________   FINAL CLINICAL IMPRESSION(S) / ED DIAGNOSES  Final  diagnoses:  Torticollis, acute     ED Discharge Orders         Ordered    methocarbamol (ROBAXIN) 500 MG tablet     Discontinue  Reprint     06/09/20 0815    traMADol (ULTRAM) 50 MG tablet  Every 6 hours PRN     Discontinue  Reprint     06/09/20 0817           Note:  This document was prepared using Dragon voice recognition software and may include unintentional dictation errors.    Johnn Hai, PA-C 06/09/20 1343    Harvest Dark, MD 06/09/20 1446

## 2020-06-09 NOTE — ED Notes (Signed)
PT states hx of HTN, states took BP meds just PTA.

## 2020-07-07 ENCOUNTER — Encounter: Payer: Self-pay | Admitting: Emergency Medicine

## 2020-07-07 ENCOUNTER — Emergency Department
Admission: EM | Admit: 2020-07-07 | Discharge: 2020-07-07 | Disposition: A | Discharge status: Home / Self Care | Payer: BLUE CROSS/BLUE SHIELD | Attending: Emergency Medicine | Admitting: Emergency Medicine

## 2020-07-07 ENCOUNTER — Emergency Department: Payer: BLUE CROSS/BLUE SHIELD

## 2020-07-07 ENCOUNTER — Other Ambulatory Visit: Payer: Self-pay

## 2020-07-07 DIAGNOSIS — F17210 Nicotine dependence, cigarettes, uncomplicated: Secondary | ICD-10-CM | POA: Diagnosis not present

## 2020-07-07 DIAGNOSIS — I10 Essential (primary) hypertension: Secondary | ICD-10-CM | POA: Diagnosis not present

## 2020-07-07 DIAGNOSIS — Y929 Unspecified place or not applicable: Secondary | ICD-10-CM | POA: Diagnosis not present

## 2020-07-07 DIAGNOSIS — Z79899 Other long term (current) drug therapy: Secondary | ICD-10-CM | POA: Diagnosis not present

## 2020-07-07 DIAGNOSIS — E039 Hypothyroidism, unspecified: Secondary | ICD-10-CM | POA: Diagnosis not present

## 2020-07-07 DIAGNOSIS — S199XXA Unspecified injury of neck, initial encounter: Secondary | ICD-10-CM | POA: Diagnosis present

## 2020-07-07 DIAGNOSIS — Y998 Other external cause status: Secondary | ICD-10-CM | POA: Diagnosis not present

## 2020-07-07 DIAGNOSIS — Y9389 Activity, other specified: Secondary | ICD-10-CM | POA: Diagnosis not present

## 2020-07-07 DIAGNOSIS — S161XXA Strain of muscle, fascia and tendon at neck level, initial encounter: Secondary | ICD-10-CM | POA: Diagnosis not present

## 2020-07-07 DIAGNOSIS — M25512 Pain in left shoulder: Secondary | ICD-10-CM | POA: Diagnosis not present

## 2020-07-07 DIAGNOSIS — X500XXA Overexertion from strenuous movement or load, initial encounter: Secondary | ICD-10-CM | POA: Diagnosis not present

## 2020-07-07 MED ORDER — HYDROCODONE-ACETAMINOPHEN 5-325 MG PO TABS: 1.0000 | ORAL_TABLET | Freq: Four times a day (QID) | ORAL | 0 refills | Status: DC | PRN

## 2020-07-07 MED ORDER — METHOCARBAMOL 500 MG PO TABS: 500.0000 mg | ORAL_TABLET | Freq: Four times a day (QID) | ORAL | 0 refills | Status: DC | PRN

## 2020-07-07 MED ORDER — DEXAMETHASONE SODIUM PHOSPHATE 10 MG/ML IJ SOLN
10.0000 mg | Freq: Once | INTRAMUSCULAR | Status: AC
  Administered 2020-07-07: 10 mg via INTRAMUSCULAR
  Filled 2020-07-07: qty 1

## 2020-07-07 NOTE — Discharge Instructions (Signed)
Follow-up with your primary care provider or [facility] if any continued problems with your neck.  Use ice or heat to your neck as needed for discomfort.  Begin taking medication as directed.  The pain medication and muscle relaxant together may make you drowsy and increase your risk for injury.  Do not take these if you plan on driving or operating machinery.  If not improving you should follow-up with your primary care provider.

## 2020-07-07 NOTE — ED Triage Notes (Signed)
Pt reports lifted a heavy box yesterday and strained her neck and now the pain is radiating into her left shoulder.

## 2020-07-07 NOTE — ED Provider Notes (Signed)
[facility] Emergency Department Provider Note   ____________________________________________   None    (approximate)  I have reviewed the triage vital signs and the nursing notes.   HISTORY  Chief Complaint Neck Injury and Shoulder Pain   HPI Lynn Wilson is a 39 y.o. female presents to the ED with complaint of neck pain and left shoulder pain.  Patient states she lifted a heavy box yesterday which strained her neck.  Patient states she woke this morning with increased pain in her neck and now radiating into her left shoulder.  She states that pain is increased with range of motion.  She denies any chest pain, shortness of breath, indigestion, nausea or vomiting.  She denies any previous injury to her neck or left shoulder.  Currently she rates her pain as an 8 out of 10.      Past Medical History:  Diagnosis Date  . GERD (gastroesophageal reflux disease)   . Hypertension   . Hypothyroid   . Hypothyroid 01/25/2018  . Iron deficiency anemia due to chronic blood loss 02/16/2018    Patient Active Problem List   Diagnosis Date Noted  . Chronic left shoulder pain 02/22/2019  . Chronic gastritis without bleeding 02/22/2019  . Menorrhagia with regular cycle 05/10/2018  . Internal bleeding hemorrhoids 05/10/2018  . Migraine without aura and responsive to treatment 02/16/2018  . Iron deficiency anemia due to chronic blood loss 02/16/2018  . Hyperglycemia 01/29/2018  . Metabolic syndrome 25/42/7062  . Hypertension 01/25/2018  . GERD (gastroesophageal reflux disease) 01/25/2018  . Hypothyroid 01/25/2018  . Morbid obesity ([place]) 01/25/2018    Past Surgical History:  Procedure Laterality Date  . COLONOSCOPY WITH PROPOFOL N/A 09/08/2018   Procedure: COLONOSCOPY WITH PROPOFOL;  Surgeon: Lin Landsman, MD;  Location: [facility] ENDOSCOPY;  Service: Gastroenterology;  Laterality: N/A;  . ESOPHAGOGASTRODUODENOSCOPY (EGD) WITH PROPOFOL N/A 09/08/2018    Procedure: ESOPHAGOGASTRODUODENOSCOPY (EGD) WITH PROPOFOL;  Surgeon: Lin Landsman, MD;  Location: [facility] ENDOSCOPY;  Service: Gastroenterology;  Laterality: N/A;    Prior to Admission medications   Medication Sig Start Date End Date Taking? Authorizing Provider  amLODipine (NORVASC) 5 MG tablet Take 1 tablet (5 mg total) by mouth daily. 08/07/19   Hubbard Hartshorn, FNP  cholecalciferol (VITAMIN D) 1000 units tablet Take 2,000 Units by mouth daily.    [provider]  dicyclomine (BENTYL) 20 MG tablet Take 1 tablet (20 mg total) by mouth 4 (four) times daily -  before meals and at bedtime. 08/25/19   Steele Sizer, MD  hydrochlorothiazide (HYDRODIURIL) 12.5 MG tablet Take 1 tablet (12.5 mg total) by mouth daily. 08/07/19   Hubbard Hartshorn, FNP  HYDROcodone-acetaminophen (NORCO/VICODIN) 5-325 MG tablet Take 1 tablet by mouth every 6 (six) hours as needed for moderate pain. 07/07/20   Johnn Hai, PA-C  levothyroxine (SYNTHROID) 100 MCG tablet Take 1 tablet (100 mcg total) by mouth every morning. 08/25/19   Steele Sizer, MD  methocarbamol (ROBAXIN) 500 MG tablet Take 1 tablet (500 mg total) by mouth every 6 (six) hours as needed for muscle spasms. As needed for muscle spasms 07/07/20   Letitia Neri L, PA-C  NIFEdipine POWD 1 each by Does not apply route 2 (two) times daily. 08/25/19   Steele Sizer, MD  sucralfate (CARAFATE) 1 g tablet Take 1 tablet (1 g total) by mouth 4 (four) times daily for 5 days. 10/16/19 10/21/19  Lavonia Drafts, MD  Cetirizine HCl 10 MG CAPS Take 1  capsule (10 mg total) by mouth daily. 03/27/18 06/09/20  Triplett, Dessa Phi, FNP  pantoprazole (PROTONIX) 40 MG tablet Take 1 tablet (40 mg total) by mouth 2 (two) times daily before a meal. 08/25/19 07/07/20  Steele Sizer, MD    Allergies Celebrex [celecoxib], Sesame seed (diagnostic), and Lisinopril  Family History  Problem Relation Age of Onset  . Hypertension Mother   . Hypertension Father   . Stroke Father      Social History Social History   Tobacco Use  . Smoking status: Current Every Day Smoker    Packs/day: 0.50    Years: 19.00    Pack years: 9.50    Types: Cigarettes    Start date: 01/26/2000  . Smokeless tobacco: Never Used  . Tobacco comment: contemplating   Vaping Use  . Vaping Use: Never used  Substance Use Topics  . Alcohol use: Yes    Alcohol/week: 2.0 standard drinks    Types: 2 Cans of beer per week  . Drug use: No    Review of Systems Constitutional: No fever/chills Eyes: No visual changes. ENT: No sore throat. Cardiovascular: Denies chest pain. Respiratory: Denies shortness of breath.  Negative for cough. Gastrointestinal: No abdominal pain.  No nausea, no vomiting.   Musculoskeletal: Positive left arm pain.  Positive cervical pain. Skin: Negative for rash. Neurological: Negative for headaches, focal weakness or numbness. ____________________________________________   PHYSICAL EXAM:  VITAL SIGNS: ED Triage Vitals  Enc Vitals Group     BP 07/07/20 0804 (!) 142/90     Pulse Rate 07/07/20 0804 79     Resp 07/07/20 0804 20     Temp 07/07/20 0804 98.1 F (36.7 C)     Temp Source 07/07/20 0804 Oral     SpO2 07/07/20 0804 96 %     Weight 07/07/20 0800 (!) 235 lb (106.6 kg)     Height 07/07/20 0800 5\' 4"  (1.626 m)     Head Circumference --      Peak Flow --      Pain Score 07/07/20 0800 8     Pain Loc --      Pain Edu? --      Excl. in [place]? --     Constitutional: Alert and oriented. Well appearing and in no acute distress. Eyes: Conjunctivae are normal. PERRL. EOMI. Head: Atraumatic. Neck: No stridor.   Cardiovascular: Normal rate, regular rhythm. Grossly normal heart sounds.  Good peripheral circulation. Respiratory: Normal respiratory effort.  No retractions. Lungs CTAB. Musculoskeletal: On examination of the neck there is no gross deformity and no point tenderness on palpation of cervical spine posteriorly.  Range of motion is decreased in lateral  movement bilaterally.  Patient is able to flex and extend with minimal discomfort.  Tenderness is noted on palpation of the left trapezius muscle but no tenderness noted into the rhomboid or parascapular muscles.  Good muscle strength left arm. Neurologic:  Normal speech and language. No gross focal neurologic deficits are appreciated.  Skin:  Skin is warm, dry and intact. No rash noted. Psychiatric: Mood and affect are normal. Speech and behavior are normal.  ____________________________________________   LABS (all labs ordered are listed, but only abnormal results are displayed)  Labs Reviewed - No data to display   RADIOLOGY   Official radiology report(s): DG Cervical Spine 2-3 Views  Result Date: 07/07/2020 CLINICAL DATA:  Pain, injury while lifting a box EXAM: CERVICAL SPINE - 2-3 VIEW COMPARISON:  None. FINDINGS: There is no evidence of  cervical spine fracture or prevertebral soft tissue swelling. Alignment is normal. Disc spaces and vertebral body heights are preserved. No other significant bone abnormalities are identified. IMPRESSION: No fracture or static subluxation of the cervical spine. Disc spaces and vertebral body heights are preserved. Electronically Signed   By: Eddie Candle M.D.   On: 07/07/2020 09:41    ____________________________________________   PROCEDURES  Procedure(s) performed (including Critical Care):  Procedures   ____________________________________________   INITIAL IMPRESSION / ASSESSMENT AND PLAN / ED COURSE  As part of my medical decision making, I reviewed the following data within the electronic MEDICAL RECORD NUMBER Notes from prior ED visits and [place] Controlled Substance Database  Antoinette Borgwardt [place] was evaluated in Emergency Department on 07/07/2020 for the symptoms described in the history of present illness. She was evaluated in the context of the global COVID-19 pandemic, which necessitated consideration that the patient might be at risk for  infection with the SARS-CoV-2 virus that causes COVID-19. Institutional protocols and algorithms that pertain to the evaluation of patients at risk for COVID-19 are in a state of rapid change based on information released by regulatory bodies including the CDC and federal and state organizations. These policies and algorithms were followed during the patient's care in the ED.  39 year old female presents to the ED with complaint of neck and left arm pain after lifting a box at work yesterday.  Patient states that she did not initially have pain but this morning has both neck pain with radiation down her left arm.  This is exacerbated by movement of her neck.  She denies any previous injuries.  X-rays were negative.  Patient was given Decadron 10 mg IM while in the ED.  Physical exam is consistent with a cervical strain with radiculopathy.  Patient was discharged with a prescription for hydrocodone-acetaminophen and methocarbamol.  Patient is encouraged to use ice or heat to her neck as needed for discomfort.  She will follow-up with her PCP if any continued problems.  She was also given a note for light duty to take to work. ____________________________________________   FINAL CLINICAL IMPRESSION(S) / ED DIAGNOSES  Final diagnoses:  Acute cervical myofascial strain, initial encounter     ED Discharge Orders         Ordered    methocarbamol (ROBAXIN) 500 MG tablet  Every 6 hours PRN     Discontinue  Reprint     07/07/20 1000    HYDROcodone-acetaminophen (NORCO/VICODIN) 5-325 MG tablet  Every 6 hours PRN     Discontinue  Reprint     07/07/20 1000           Note:  This document was prepared using Dragon voice recognition software and may include unintentional dictation errors.    Johnn Hai, PA-C 07/07/20 1111    Lavonia Drafts, MD 07/07/20 1145

## 2020-07-28 ENCOUNTER — Emergency Department
Admission: EM | Admit: 2020-07-28 | Discharge: 2020-07-28 | Discharge status: Against Medical Advice | Payer: BLUE CROSS/BLUE SHIELD

## 2020-08-27 ENCOUNTER — Encounter: Payer: Self-pay | Admitting: Internal Medicine

## 2020-08-27 ENCOUNTER — Other Ambulatory Visit: Payer: Self-pay

## 2020-08-27 ENCOUNTER — Ambulatory Visit (INDEPENDENT_AMBULATORY_CARE_PROVIDER_SITE_OTHER): Payer: BLUE CROSS/BLUE SHIELD | Admitting: Internal Medicine

## 2020-08-27 VITALS — BP 138/92 | HR 96 | Temp 97.5°F | Resp 16 | Ht 65.0 in | Wt 239.4 lb

## 2020-08-27 DIAGNOSIS — M542 Cervicalgia: Secondary | ICD-10-CM

## 2020-08-27 DIAGNOSIS — S46812A Strain of other muscles, fascia and tendons at shoulder and upper arm level, left arm, initial encounter: Secondary | ICD-10-CM

## 2020-08-27 DIAGNOSIS — R519 Headache, unspecified: Secondary | ICD-10-CM | POA: Diagnosis not present

## 2020-08-27 DIAGNOSIS — M62838 Other muscle spasm: Secondary | ICD-10-CM

## 2020-08-27 DIAGNOSIS — I10 Essential (primary) hypertension: Secondary | ICD-10-CM | POA: Diagnosis not present

## 2020-08-27 DIAGNOSIS — G8929 Other chronic pain: Secondary | ICD-10-CM

## 2020-08-27 DIAGNOSIS — R030 Elevated blood-pressure reading, without diagnosis of hypertension: Secondary | ICD-10-CM | POA: Insufficient documentation

## 2020-08-27 MED ORDER — CYCLOBENZAPRINE HCL 10 MG PO TABS: 10.0000 mg | ORAL_TABLET | Freq: Three times a day (TID) | ORAL | 1 refills | Status: DC | PRN

## 2020-08-27 MED ORDER — MELOXICAM 15 MG PO TABS: 15.0000 mg | ORAL_TABLET | Freq: Every day | ORAL | 1 refills | Status: DC

## 2020-08-27 NOTE — Progress Notes (Signed)
Patient ID: Lynn Wilson, female    DOB: [birth year], 39 y.o.   MRN: [id number]  PCP: Steele Sizer, MD  Chief Complaint  Patient presents with  . Headache  . Neck Pain    Has been happening for about a year now  . Shoulder Pain    left should pain, she has seen [facility] and was given a cortisone shot in the joint that did not last    Subjective:   Lynn Wilson is a 39 y.o. female, presents to clinic with CC of the following:  Chief Complaint  Patient presents with  . Headache  . Neck Pain    Has been happening for about a year now  . Shoulder Pain    left should pain, she has seen [facility] and was given a cortisone shot in the joint that did not last    HPI:  Patient is a 39 year old female patient of Dr. Ancil Boozer She was seen in the emergency room in June for torticollis, in July for neck and shoulder pain, and presented to the emergency room in August but did not stay to be seen. Her last visit with Dr. Ancil Boozer was in September 2020. She follows up today with neck and shoulder pain, and headaches.  She had prior work-up of left shoulder pain including an x-ray which was negative and CT arthrography in December 2020, with follow-up with [facility] several times noted in 2021, last on 04/10/2020. The CT results were as follows:  IMPRESSION: 1. Grossly normal appearance of the rotator cuff tendons by CT. No large rotator cuff tear. 2. Normal appearance of the long head biceps tendon and glenoid labrum. 3. Os acromial but no AC joint arthropathy or other findings for bony impingement. 4. No acute bony findings.  On her visit to the emergency room in July, the following was noted:  Lynn Wilson is a 39 y.o. female presents to the ED with complaint of neck pain and left shoulder pain.  Patient states she lifted a heavy box yesterday which strained her neck.  Patient states she woke this morning with increased pain in her neck and now  radiating into her left shoulder.  She states that pain is increased with range of motion.  She denies any chest pain, shortness of breath, indigestion, nausea or vomiting.  She denies any previous injury to her neck or left shoulder.  Currently she rates her pain as an 8 out of 10.    The assessment/plan was as follows:   X-rays were negative.  Patient was given Decadron 10 mg IM while in the ED.  Physical exam is consistent with a cervical strain with radiculopathy.  Patient was discharged with a prescription for hydrocodone-acetaminophen and methocarbamol.  Patient is encouraged to use ice or heat to her neck as needed for discomfort.  She will follow-up with her PCP if any continued problems.  She was also given a note for light duty to take to work.  On her last visit with Dr. Ancil Boozer 08/25/2019, over a year ago, the following was noted: Leftshoulder pain-She states she has a long history of left shoulder pain, she works in [employer] and developed pain about 6 months ago, she was referred to Ortho but had to cancelled because of work and will try to re-schedule.  Migraine: she has been doing well since Dec 2019 after visit to [facility], symptoms improved with iron infusion, having some neck pain that causes  a different type of headache, but is secondary to her left shoulder pain  She has not followed up with Dr. Ancil Boozer since, and strongly encouraged that today.  She notes that she has been struggling with pain more in the left upper back towards the shoulder, and up the neck with some headaches on the left side of the head related to the neck pain.  She notes she has been battling the symptoms for a year plus, although notes they have been a little more problematic in the recent past again.  She does not have marked pain over the shoulder joint proper. She has a history of migraines, although these headaches are different, more in the back and up the left side of the head, and seem to occur  with that upper back and neck pain. She had no one-time trauma before this started, does not do a lot of heavy lifting at her work presently. She has seen Ortho previously for her shoulder discomfort, and noted the injection given lasted only briefly, and the pain presently is more in the upper back area. She has tried taking the baclofen product she has, also an ibuprofen product, and notes the ibuprofen product helps some.  She does take pantoprazole to help protect her stomach from reflux symptoms. Denies any pains down the arm, no numbness or tingling in the arm, not dropping things.  No chest pains or shortness of breath.  No recent infectious symptoms such as fever.   Patient Active Problem List   Diagnosis Date Noted  . Elevated BP without diagnosis of hypertension 08/27/2020  . Chronic left shoulder pain 02/22/2019  . Chronic gastritis without bleeding 02/22/2019  . Menorrhagia with regular cycle 05/10/2018  . Internal bleeding hemorrhoids 05/10/2018  . Migraine without aura and responsive to treatment 02/16/2018  . Iron deficiency anemia due to chronic blood loss 02/16/2018  . Hyperglycemia 01/29/2018  . Metabolic syndrome 95/18/8416  . Hypertension 01/25/2018  . GERD (gastroesophageal reflux disease) 01/25/2018  . Hypothyroid 01/25/2018  . Morbid obesity ([place]) 01/25/2018      Current Outpatient Medications:  .  amLODipine (NORVASC) 5 MG tablet, Take 1 tablet (5 mg total) by mouth daily., Disp: 90 tablet, Rfl: 1 .  baclofen (LIORESAL) 20 MG tablet, baclofen 20 mg tablet, Disp: , Rfl:  .  cyclobenzaprine (FLEXERIL) 5 MG tablet, cyclobenzaprine 5 mg tablet, Disp: , Rfl:  .  dicyclomine (BENTYL) 20 MG tablet, Take 1 tablet (20 mg total) by mouth 4 (four) times daily -  before meals and at bedtime., Disp: 40 tablet, Rfl: 0 .  hydrochlorothiazide (HYDRODIURIL) 12.5 MG tablet, Take 1 tablet (12.5 mg total) by mouth daily., Disp: 90 tablet, Rfl: 1 .  HYDROcodone-acetaminophen  (NORCO/VICODIN) 5-325 MG tablet, Take 1 tablet by mouth every 6 (six) hours as needed for moderate pain., Disp: 12 tablet, Rfl: 0 .  levothyroxine (SYNTHROID) 100 MCG tablet, Take 1 tablet (100 mcg total) by mouth every morning., Disp: 90 tablet, Rfl: 1 .  methocarbamol (ROBAXIN) 500 MG tablet, Take 1 tablet (500 mg total) by mouth every 6 (six) hours as needed for muscle spasms. As needed for muscle spasms, Disp: 15 tablet, Rfl: 0   Allergies  Allergen Reactions  . Celebrex [Celecoxib]     "shakes/tremors"  . Sesame Seed (Diagnostic) Itching  . Lisinopril Rash     Past Surgical History:  Procedure Laterality Date  . COLONOSCOPY WITH PROPOFOL N/A 09/08/2018   Procedure: COLONOSCOPY WITH PROPOFOL;  Surgeon: Lin Landsman,  MD;  Location: [facility] ENDOSCOPY;  Service: Gastroenterology;  Laterality: N/A;  . ESOPHAGOGASTRODUODENOSCOPY (EGD) WITH PROPOFOL N/A 09/08/2018   Procedure: ESOPHAGOGASTRODUODENOSCOPY (EGD) WITH PROPOFOL;  Surgeon: Lin Landsman, MD;  Location: [facility] ENDOSCOPY;  Service: Gastroenterology;  Laterality: N/A;     Family History  Problem Relation Age of Onset  . Hypertension Mother   . Hypertension Father   . Stroke Father      Social History   Tobacco Use  . Smoking status: Current Every Day Smoker    Packs/day: 0.50    Years: 19.00    Pack years: 9.50    Types: Cigarettes    Start date: 01/26/2000  . Smokeless tobacco: Never Used  . Tobacco comment: contemplating   Substance Use Topics  . Alcohol use: Yes    Alcohol/week: 2.0 standard drinks    Types: 2 Cans of beer per week    With staff assistance, above reviewed with the patient today.  ROS: As per HPI, otherwise no specific complaints on a limited and focused system review   No results found for this or any previous visit (from the past 72 hour(s)).   PHQ2/9: Depression screen [facility] 2/9 08/27/2020 08/27/2020 08/25/2019 08/07/2019 07/12/2019  Decreased Interest 0 0 0 0 0  Down, Depressed,  Hopeless 1 0 0 0 0  PHQ - 2 Score 1 0 0 0 0  Altered sleeping - - 2 0 0  Tired, decreased energy - - 2 0 0  Change in appetite - - 2 0 0  Feeling bad or failure about yourself  - - 0 0 0  Trouble concentrating - - 0 0 0  Moving slowly or fidgety/restless - - 0 0 0  Suicidal thoughts - - 0 0 0  PHQ-9 Score - - 6 0 0  Difficult doing work/chores - - Somewhat difficult Not difficult at all Not difficult at all   PHQ-2/9 Result reviewed  Fall Risk: Fall Risk  08/27/2020 08/25/2019 08/07/2019 07/12/2019 02/22/2019  Falls in the past year? 0 0 0 0 0  Number falls in past yr: 0 0 0 0 0  Injury with Fall? 0 0 0 0 0  Comment - - - - -  Follow up Falls evaluation completed - Falls evaluation completed - -      Objective:   Vitals:   08/27/20 1310  BP: (!) 138/92  Pulse: 96  Resp: 16  Temp: (!) 97.5 F (36.4 C)  TempSrc: Oral  SpO2: 99%  Weight: 239 lb 6.4 oz (108.6 kg)  Height: 5\' 5"  (1.651 m)    Body mass index is 39.84 kg/m.  Physical Exam   NAD, masked, obese HEENT - [place]/AT, sclera anicteric, PERRL, EOMI, conj - non-inj'ed,   Neck - supple, no adenopathy, no rigidity, nontender palpating over the cervical spine, with tenderness left of the cervical spine towards the trap muscle complex, and slightly towards the sternocleidomastoid and extending anteriorly above the clavicle, he was able to touch each shoulder with her chin, although discomfort noted with lateral flexion, especially at the extremes, she also had full motion with flexion extension, although noted discomfort with extension at the extremes. Car - RRR without m/g/r, not tachycardic Pulm- RR and effort normal at rest, CTA without wheeze or rales Back  -nontender with palpation in the infrascapular regions bilaterally, nontender over the thoracic spine with palpation  Ext -nontender over the anterior and posterior joint lines of the left shoulder, minimally tender at the subacromial bursal region, not  marked, nontender  focally at the [facility] joint, with shoulder motions, noted discomfort, mostly in that upper left back area/trap muscle complex. Strength was adequate in the left upper extremity including with grip.  Sensation intact to light touch down the left upper extremity Neuro/psychiatric - affect was not flat, appropriate with conversation  Alert with normal speech  Results for orders placed or performed during the hospital encounter of 04/19/20  CBC  Result Value Ref Range   WBC 8.2 4.0 - 10.5 K/uL   RBC 4.91 3.87 - 5.11 MIL/uL   Hemoglobin 10.9 (L) 12.0 - 15.0 g/dL   HCT 34.2 (L) 36 - 46 %   MCV 69.7 (L) 80.0 - 100.0 fL   MCH 22.2 (L) 26.0 - 34.0 pg   MCHC 31.9 30.0 - 36.0 g/dL   RDW 17.1 (H) 11.5 - 15.5 %   Platelets 418 (H) 150 - 400 K/uL   nRBC 0.0 0.0 - 0.2 %  Comprehensive metabolic panel  Result Value Ref Range   Sodium 136 135 - 145 mmol/L   Potassium 3.6 3.5 - 5.1 mmol/L   Chloride 103 98 - 111 mmol/L   CO2 24 22 - 32 mmol/L   Glucose, Bld 99 70 - 99 mg/dL   BUN 13 6 - 20 mg/dL   Creatinine, Ser 0.73 0.44 - 1.00 mg/dL   Calcium 9.0 8.9 - 10.3 mg/dL   Total Protein 8.2 (H) 6.5 - 8.1 g/dL   Albumin 3.9 3.5 - 5.0 g/dL   AST 36 15 - 41 U/L   ALT 46 (H) 0 - 44 U/L   Alkaline Phosphatase 103 38 - 126 U/L   Total Bilirubin 0.4 0.3 - 1.2 mg/dL   GFR calc non Af Amer >60 >60 mL/min   GFR calc Af Amer >60 >60 mL/min   Anion gap 9 5 - 15  Troponin I (High Sensitivity)  Result Value Ref Range   Troponin I (High Sensitivity) 3 <18 ng/L  Troponin I (High Sensitivity)  Result Value Ref Range   Troponin I (High Sensitivity) 3 <18 ng/L       Assessment & Plan:   1. Essential hypertension Her blood pressure was slightly elevated today, and she noted when checked often in an emergency room setting as it can be slightly elevated. Pain can also increase pressure some, and may be contributing. Recommended she follow-up with Dr. Ancil Boozer again as she is overdue for a follow-up, and if she can  get a couple readings on the outside before that follow-up visit that would be helpful. We will not add any medication today.  2. Strain of left trapezius muscle, initial encounter 3. Neck pain 4. Chronic nonintractable headache, unspecified headache type 5. Trapezius muscle spasm Discussed how her symptoms seem to be all interrelated, and seemed to be more muscle based with a trap muscle strain/spasm a large part of her symptoms.  Do feel the headache is related to the muscle strain/spasm given it is posterior and left-sided, and she agreed.  She has some mild shoulder soreness, although not marked shoulder pain, with the paraspinous neck muscles involved as well. She noted the ibuprofen was helpful at times, Do feel that an NSAID would be helpful with its anti-inflammatory effects, although noted concerns with this medicine chronically with respect to stomach issues and blood pressure especially. Felt reasonable to utilize in the short-term presently, and will add a low bit product-take once daily and take with food.  She will continue her PPI presently  to help lessen any potential effects on the stomach. Also will add a muscle relaxer cyclobenzaprine, take mainly at bedtime as may make drowsy during the day, and not to drive after she takes. Emphasized the importance of local measures with warm compresses, or warm water in the shower to the area, followed by range of motion exercises, followed by cold, to help lessen inflammation. Also can try some topical patches, and noted some have lidocaine, some have capsaicin which can be helpful. She did have prior neck films with a recent ER visit, and also had prior shoulder films including a CT arthrogram on a prior orthopedic visit, and do not feel repeating imaging is needed today, especially based on the above clinical assessment.  We will follow-up if symptoms not improving or more problematic, may need imaging at that point, may need referral to  physical therapy, possibly orthopedic involvement again pending on her follow-up assessment.  - meloxicam (MOBIC) 15 MG tablet; Take 1 tablet (15 mg total) by mouth daily. Take as needed for pain, Take with food  Dispense: 30 tablet; Refill: 1 - cyclobenzaprine (FLEXERIL) 10 MG tablet; Take 1 tablet (10 mg total) by mouth 3 (three) times daily as needed for muscle spasms. May make drowsy, take predominantly at bedtime, not drive after taking.  Dispense: 30 tablet; Refill: 1  It has been over a year since she seen Dr. Ancil Boozer, and recommended she return for a follow-up visit, and we will schedule that on the way out to have within the next 4 to 6 weeks, and should follow-up sooner as needed.      Towanda Malkin, MD 08/27/20 1:21 PM

## 2020-08-27 NOTE — Patient Instructions (Signed)
Prescription medication was sent to the pharmacy to start.  Recommend local measures including warmth to the area, followed by range of motion exercises, followed by cold to help lessen inflammation  Can use a topical patch type product as well as we discussed that is available over-the-counter.

## 2020-10-09 NOTE — Progress Notes (Deleted)
Name: Lynn Wilson   MRN: [id number]    DOB: 1981-04-20   Date:10/09/2020       Progress Note  Subjective  Chief Complaint  Follow up   HPI Left  shoulder pain- She states she has a long history of left shoulder pain, she works in environmental services and developed pain about 6 months ago, she was referred to Ortho but had to cancelled because of work and will try to re-schedule.   Migraine: she has been doing well since Dec 2019 after visit to [facility], symptoms improved with iron infusion, having some neck pain that causes a different type of headache, but is secondary to her left shoulder pain   Morbid obesity: she stopped drinking sodas daily, but has been drinking juices, discussed again importance of weight loss and gave her information about carbohydrate restrictive diet   HTN: she is back on HCTZ 12.5 and norvasc 5 mg, bp today was at goal. She denies palpitation or chest pain, except for shoulder pain that causes upper left wall pain   Tobacco AOZ:HYQMVHQ 1/2ppd. She has episodes of bronchitis, she denies cough, wheezing or SOB   Hypothyroidism: sheis compliant with thyroid medication. No change in bowel movements she has chronic dry skin, TSH stable , same dose for over one year  Iron deficiency anemia: regular but heavy cycles even clots, used to get iron infusion, last infusion was end of 2019, her MCV is dropping and also HCT. She never startedDepo, she states she usually started her cycles on Sunday's explained she can come in any time during her period .  She also has hemorrhoidswithout rectal bleeding, chronic gastritis per EGD, seen by Dr. Marius Ditch and is taking Protonix bid since. Only had one hemorrhoid banding but did not go back for further therapy because of the pain after the procedure  Pre-diabetes/Metabolic Syndrome: sheendorses occasional polyphagiaandpolyuria; denies polydipsia. Last A1C was 6.1, wife recently admitted for DKA, glucose of 800.  Discussed carbohydrate restrictive diet . Reviewed last lipid panel with patient  Rectal pain: She has a history of hemorrhoids, states no straining or rectal bleeding, developed rectal pain last night described as throbbing and intermittent, woke her up during the night. Denies change in bowel movements *** Patient Active Problem List   Diagnosis Date Noted  . Elevated BP without diagnosis of hypertension 08/27/2020  . Chronic left shoulder pain 02/22/2019  . Chronic gastritis without bleeding 02/22/2019  . Menorrhagia with regular cycle 05/10/2018  . Internal bleeding hemorrhoids 05/10/2018  . Migraine without aura and responsive to treatment 02/16/2018  . Iron deficiency anemia due to chronic blood loss 02/16/2018  . Hyperglycemia 01/29/2018  . Metabolic syndrome 46/96/2952  . Hypertension 01/25/2018  . GERD (gastroesophageal reflux disease) 01/25/2018  . Hypothyroid 01/25/2018  . Morbid obesity ([place]) 01/25/2018    Past Surgical History:  Procedure Laterality Date  . COLONOSCOPY WITH PROPOFOL N/A 09/08/2018   Procedure: COLONOSCOPY WITH PROPOFOL;  Surgeon: Lin Landsman, MD;  [facility]: [facility] ENDOSCOPY;  Service: Gastroenterology;  Laterality: N/A;  . ESOPHAGOGASTRODUODENOSCOPY (EGD) WITH PROPOFOL N/A 09/08/2018   Procedure: ESOPHAGOGASTRODUODENOSCOPY (EGD) WITH PROPOFOL;  Surgeon: Lin Landsman, MD;  Location[facility] ENDOSCOPY;  Service: Gastroenterology;  Laterality: N/A;    Family History  Problem Relation Age of Onset  . Hypertension Mother   . Hypertension Father   . Stroke Father     Social History   Tobacco Use  . Smoking status: Current Every Day Smoker    Packs/day: 0.50  Years: 19.00    Pack years: 9.50    Types: Cigarettes    Start date: 01/26/2000  . Smokeless tobacco: Never Used  . Tobacco comment: contemplating   Substance Use Topics  . Alcohol use: Yes    Alcohol/week: 2.0 standard drinks    Types: 2 Cans of beer per week     Current  Outpatient Medications:  .  amLODipine (NORVASC) 5 MG tablet, Take 1 tablet (5 mg total) by mouth daily., Disp: 90 tablet, Rfl: 1 .  baclofen (LIORESAL) 20 MG tablet, baclofen 20 mg tablet, Disp: , Rfl:  .  cyclobenzaprine (FLEXERIL) 10 MG tablet, Take 1 tablet (10 mg total) by mouth 3 (three) times daily as needed for muscle spasms. May make drowsy, take predominantly at bedtime, not drive after taking., Disp: 30 tablet, Rfl: 1 .  dicyclomine (BENTYL) 20 MG tablet, Take 1 tablet (20 mg total) by mouth 4 (four) times daily -  before meals and at bedtime., Disp: 40 tablet, Rfl: 0 .  hydrochlorothiazide (HYDRODIURIL) 12.5 MG tablet, Take 1 tablet (12.5 mg total) by mouth daily., Disp: 90 tablet, Rfl: 1 .  HYDROcodone-acetaminophen (NORCO/VICODIN) 5-325 MG tablet, Take 1 tablet by mouth every 6 (six) hours as needed for moderate pain., Disp: 12 tablet, Rfl: 0 .  levothyroxine (SYNTHROID) 100 MCG tablet, Take 1 tablet (100 mcg total) by mouth every morning., Disp: 90 tablet, Rfl: 1 .  meloxicam (MOBIC) 15 MG tablet, Take 1 tablet (15 mg total) by mouth daily. Take as needed for pain, Take with food, Disp: 30 tablet, Rfl: 1  Allergies  Allergen Reactions  . Celebrex [Celecoxib]     "shakes/tremors"  . Sesame Seed (Diagnostic) Itching  . Lisinopril Rash    I personally reviewed {Reviewed:14835} with the patient/caregiver today.   ROS  ***  Objective  There were no vitals filed for this visit.  There is no height or weight on file to calculate BMI.  Physical Exam ***  No results found for this or any previous visit (from the past 2160 hour(s)).  Diabetic Foot Exam: Diabetic Foot Exam - Simple   No data filed     ***  PHQ2/9: Depression screen [facility] 2/9 08/27/2020 08/27/2020 08/25/2019 08/07/2019 07/12/2019  Decreased Interest 0 0 0 0 0  Down, Depressed, Hopeless 1 0 0 0 0  PHQ - 2 Score 1 0 0 0 0  Altered sleeping - - 2 0 0  Tired, decreased energy - - 2 0 0  Change in appetite -  - 2 0 0  Feeling bad or failure about yourself  - - 0 0 0  Trouble concentrating - - 0 0 0  Moving slowly or fidgety/restless - - 0 0 0  Suicidal thoughts - - 0 0 0  PHQ-9 Score - - 6 0 0  Difficult doing work/chores - - Somewhat difficult Not difficult at all Not difficult at all    phq 9 is {gen pos [id number]} ***  Fall Risk: Fall Risk  08/27/2020 08/25/2019 08/07/2019 07/12/2019 02/22/2019  Falls in the past year? 0 0 0 0 0  Number falls in past yr: 0 0 0 0 0  Injury with Fall? 0 0 0 0 0  Comment - - - - -  Follow up Falls evaluation completed - Falls evaluation completed - -   ***   Functional Status Survey:   ***   Assessment & Plan  *** There are no diagnoses linked to this encounter.

## 2020-10-10 ENCOUNTER — Ambulatory Visit: Payer: BLUE CROSS/BLUE SHIELD | Admitting: Family Medicine

## 2020-10-31 ENCOUNTER — Encounter: Payer: Self-pay | Admitting: Emergency Medicine

## 2020-10-31 ENCOUNTER — Other Ambulatory Visit: Payer: Self-pay

## 2020-10-31 ENCOUNTER — Emergency Department: Payer: BLUE CROSS/BLUE SHIELD

## 2020-10-31 ENCOUNTER — Emergency Department
Admission: EM | Admit: 2020-10-31 | Discharge: 2020-10-31 | Disposition: A | Discharge status: Home / Self Care | Payer: BLUE CROSS/BLUE SHIELD | Attending: Emergency Medicine | Admitting: Emergency Medicine

## 2020-10-31 DIAGNOSIS — I10 Essential (primary) hypertension: Secondary | ICD-10-CM | POA: Diagnosis not present

## 2020-10-31 DIAGNOSIS — G8929 Other chronic pain: Secondary | ICD-10-CM

## 2020-10-31 DIAGNOSIS — F17210 Nicotine dependence, cigarettes, uncomplicated: Secondary | ICD-10-CM | POA: Diagnosis not present

## 2020-10-31 DIAGNOSIS — S39012A Strain of muscle, fascia and tendon of lower back, initial encounter: Secondary | ICD-10-CM | POA: Diagnosis not present

## 2020-10-31 DIAGNOSIS — S46812A Strain of other muscles, fascia and tendons at shoulder and upper arm level, left arm, initial encounter: Secondary | ICD-10-CM

## 2020-10-31 DIAGNOSIS — E039 Hypothyroidism, unspecified: Secondary | ICD-10-CM | POA: Insufficient documentation

## 2020-10-31 DIAGNOSIS — W010XXA Fall on same level from slipping, tripping and stumbling without subsequent striking against object, initial encounter: Secondary | ICD-10-CM | POA: Insufficient documentation

## 2020-10-31 DIAGNOSIS — R519 Headache, unspecified: Secondary | ICD-10-CM

## 2020-10-31 DIAGNOSIS — M62838 Other muscle spasm: Secondary | ICD-10-CM

## 2020-10-31 DIAGNOSIS — Z79899 Other long term (current) drug therapy: Secondary | ICD-10-CM | POA: Diagnosis not present

## 2020-10-31 DIAGNOSIS — M542 Cervicalgia: Secondary | ICD-10-CM

## 2020-10-31 DIAGNOSIS — S3992XA Unspecified injury of lower back, initial encounter: Secondary | ICD-10-CM | POA: Diagnosis present

## 2020-10-31 LAB — POC URINE PREG, ED: Preg Test, Ur: NEGATIVE

## 2020-10-31 MED ORDER — CYCLOBENZAPRINE HCL 10 MG PO TABS: 10.0000 mg | ORAL_TABLET | Freq: Three times a day (TID) | ORAL | 0 refills | Status: DC | PRN

## 2020-10-31 MED ORDER — TRAMADOL HCL 50 MG PO TABS: 50.0000 mg | ORAL_TABLET | Freq: Four times a day (QID) | ORAL | 0 refills | Status: DC | PRN

## 2020-10-31 NOTE — ED Triage Notes (Signed)
Pt ambulatory to triage at this time, states mechanical fall yesterday after tripping over her dogs, states fells down several stairs. Pt c/o lower back pain at this time. NAD noted in triage at this time.

## 2020-10-31 NOTE — ED Provider Notes (Signed)
[facility] Emergency Department Provider Note ____________________________________________  Time seen: Approximately 11:52 AM  I have reviewed the triage vital signs and the nursing notes.  HISTORY  Chief Complaint Back Pain   HPI Lynn Wilson is a 39 y.o. female who presents to the emergency department for treatment and evaluation of low back pain after mechanical, non-syncopal fall yesterday. She tripped over the dogs and fell down 2 steps. No relief with ibuprofen.   Past Medical History:  Diagnosis Date  . GERD (gastroesophageal reflux disease)   . Hypertension   . Hypothyroid   . Hypothyroid 01/25/2018  . Iron deficiency anemia due to chronic blood loss 02/16/2018    Patient Active Problem List   Diagnosis Date Noted  . Elevated BP without diagnosis of hypertension 08/27/2020  . Chronic left shoulder pain 02/22/2019  . Chronic gastritis without bleeding 02/22/2019  . Menorrhagia with regular cycle 05/10/2018  . Internal bleeding hemorrhoids 05/10/2018  . Migraine without aura and responsive to treatment 02/16/2018  . Iron deficiency anemia due to chronic blood loss 02/16/2018  . Hyperglycemia 01/29/2018  . Metabolic syndrome 65/78/4696  . Hypertension 01/25/2018  . GERD (gastroesophageal reflux disease) 01/25/2018  . Hypothyroid 01/25/2018  . Morbid obesity ([place]) 01/25/2018    Past Surgical History:  Procedure Laterality Date  . COLONOSCOPY WITH PROPOFOL N/A 09/08/2018   Procedure: COLONOSCOPY WITH PROPOFOL;  Surgeon: Lin Landsman, MD;  Location: [facility] ENDOSCOPY;  Service: Gastroenterology;  Laterality: N/A;  . ESOPHAGOGASTRODUODENOSCOPY (EGD) WITH PROPOFOL N/A 09/08/2018   Procedure: ESOPHAGOGASTRODUODENOSCOPY (EGD) WITH PROPOFOL;  Surgeon: Lin Landsman, MD;  Location: [facility] ENDOSCOPY;  Service: Gastroenterology;  Laterality: N/A;    Prior to Admission medications   Medication Sig Start Date End Date Taking? Authorizing  Provider  amLODipine (NORVASC) 5 MG tablet Take 1 tablet (5 mg total) by mouth daily. 08/07/19   Hubbard Hartshorn, FNP  baclofen (LIORESAL) 20 MG tablet baclofen 20 mg tablet    [provider]  cyclobenzaprine (FLEXERIL) 10 MG tablet Take 1 tablet (10 mg total) by mouth 3 (three) times daily as needed for muscle spasms. May make drowsy, take predominantly at bedtime, not drive after taking. 10/31/20   Vaudine Dutan, Johnette Abraham B, FNP  dicyclomine (BENTYL) 20 MG tablet Take 1 tablet (20 mg total) by mouth 4 (four) times daily -  before meals and at bedtime. 08/25/19   Steele Sizer, MD  hydrochlorothiazide (HYDRODIURIL) 12.5 MG tablet Take 1 tablet (12.5 mg total) by mouth daily. 08/07/19   Hubbard Hartshorn, FNP  levothyroxine (SYNTHROID) 100 MCG tablet Take 1 tablet (100 mcg total) by mouth every morning. 08/25/19   Steele Sizer, MD  meloxicam (MOBIC) 15 MG tablet Take 1 tablet (15 mg total) by mouth daily. Take as needed for pain, Take with food 08/27/20   Towanda Malkin, MD  traMADol (ULTRAM) 50 MG tablet Take 1 tablet (50 mg total) by mouth every 6 (six) hours as needed. 10/31/20   Khadija Thier, Johnette Abraham B, FNP  Cetirizine HCl 10 MG CAPS Take 1 capsule (10 mg total) by mouth daily. 03/27/18 06/09/20  Elaisha Zahniser, Dessa Phi, FNP  pantoprazole (PROTONIX) 40 MG tablet Take 1 tablet (40 mg total) by mouth 2 (two) times daily before a meal. 08/25/19 07/07/20  Steele Sizer, MD    Allergies Celebrex [celecoxib], Sesame seed (diagnostic), and Lisinopril  Family History  Problem Relation Age of Onset  . Hypertension Mother   . Hypertension Father   . Stroke Father  Social History Social History   Tobacco Use  . Smoking status: Current Every Day Smoker    Packs/day: 0.50    Years: 19.00    Pack years: 9.50    Types: Cigarettes    Start date: 01/26/2000  . Smokeless tobacco: Never Used  . Tobacco comment: contemplating   Vaping Use  . Vaping Use: Never used  Substance Use Topics  . Alcohol use:  Yes    Alcohol/week: 2.0 standard drinks    Types: 2 Cans of beer per week  . Drug use: No    Review of Systems Constitutional: Well appearing. Respiratory: Negative for dyspnea. Cardiovascular: Negative for change in skin temperature or color. Musculoskeletal:   Negative for chronic steroid use   Negative for trauma in the presence of osteoporosis  Negative for age over 61 and trauma.  Negative for constitutional symptoms, or history of cancer  Negative for pain worse at night. Skin: Negative for rash, lesion, or wound.  Genitourinary: Negative for urinary retention. Rectal: Negative for fecal incontinence or new onset constipation/bowel habit changes. Hematological/Immunilogical: Negative for immunosuppression, IV drug use, or fever Neurological: Negative for burning, tingling, numb, electric, radiating pain in the lower extremities.                        Negative for saddle anesthesia.                        Negative for focal neurologic deficit, progressive or disabling symptoms             Negative for saddle anesthesia. ____________________________________________   PHYSICAL EXAM:  VITAL SIGNS: ED Triage Vitals [10/31/20 0923]  Enc Vitals Group     BP 138/85     Pulse Rate 92     Resp 20     Temp 98.1 F (36.7 C)     Temp Source Oral     SpO2 93 %     Weight 225 lb (102.1 kg)     Height 5\' 5"  (1.651 m)     Head Circumference      Peak Flow      Pain Score 6     Pain Loc      Pain Edu?      Excl. in [place]?     Constitutional: Alert and oriented. Well appearing and in no acute distress. Eyes: Conjunctivae are clear without discharge or drainage.  Head: Atraumatic. Neck: Full, active range of motion. Respiratory: Respirations even and unlabored. Musculoskeletal: Full ROM of the back and extremities, Strength 5/5 of the lower extremities as tested. Neurologic: Reflexes of the lower extremities are 2+. Negative straight leg raise on the right or left side. Skin:  Atraumatic.  Psychiatric: Behavior and affect are normal.  ____________________________________________   LABS (all labs ordered are listed, but only abnormal results are displayed)  Labs Reviewed  POC URINE PREG, ED   ____________________________________________  RADIOLOGY  Image of the lumbar spine is negative for acute concerns. ____________________________________________   PROCEDURES  Procedure(s) performed:  Procedures ____________________________________________   INITIAL IMPRESSION / ASSESSMENT AND PLAN / ED COURSE  Lynn Wilson is a 39 y.o. female presents to the emergency department for treatment and evaluation of back pain. See HPI for further details. Plan will be to get an x-ray since she has midline tenderness over the lower lumbar area.   X-ray is reassuring. Will prescribe flexeril and naprosyn and have her follow up  with PCP if not improving over the week. She is to return to the ER for symptoms that change or worsen if unable to see primary care.  Medications - No data to display  ED Discharge Orders         Ordered    cyclobenzaprine (FLEXERIL) 10 MG tablet  3 times daily PRN        10/31/20 1327    traMADol (ULTRAM) 50 MG tablet  Every 6 hours PRN        10/31/20 1327           Pertinent labs & imaging results that were available during my care of the patient were reviewed by me and considered in my medical decision making (see chart for details).  _________________________________________   FINAL CLINICAL IMPRESSION(S) / ED DIAGNOSES  Final diagnoses:  Strain of lumbar region, initial encounter     If controlled substance prescribed during this visit, 12 month history viewed on the [place] prior to issuing an initial prescription for Schedule II or III opiod.   Victorino Dike, FNP 10/31/20 1353    Naaman Plummer, MD 10/31/20 [phone]

## 2020-11-14 ENCOUNTER — Encounter (HOSPITAL_COMMUNITY): Payer: Self-pay

## 2020-11-14 ENCOUNTER — Emergency Department (HOSPITAL_COMMUNITY): Payer: BLUE CROSS/BLUE SHIELD

## 2020-11-14 ENCOUNTER — Emergency Department (HOSPITAL_COMMUNITY)
Admission: EM | Admit: 2020-11-14 | Discharge: 2020-11-14 | Disposition: A | Discharge status: Home / Self Care | Payer: BLUE CROSS/BLUE SHIELD | Attending: Emergency Medicine | Admitting: Emergency Medicine

## 2020-11-14 ENCOUNTER — Other Ambulatory Visit: Payer: Self-pay

## 2020-11-14 DIAGNOSIS — Z79899 Other long term (current) drug therapy: Secondary | ICD-10-CM | POA: Insufficient documentation

## 2020-11-14 DIAGNOSIS — F17210 Nicotine dependence, cigarettes, uncomplicated: Secondary | ICD-10-CM | POA: Insufficient documentation

## 2020-11-14 DIAGNOSIS — E039 Hypothyroidism, unspecified: Secondary | ICD-10-CM | POA: Insufficient documentation

## 2020-11-14 DIAGNOSIS — M779 Enthesopathy, unspecified: Secondary | ICD-10-CM | POA: Insufficient documentation

## 2020-11-14 DIAGNOSIS — M778 Other enthesopathies, not elsewhere classified: Secondary | ICD-10-CM

## 2020-11-14 DIAGNOSIS — M25521 Pain in right elbow: Secondary | ICD-10-CM | POA: Diagnosis present

## 2020-11-14 MED ORDER — DEXAMETHASONE SODIUM PHOSPHATE 10 MG/ML IJ SOLN
10.0000 mg | Freq: Once | INTRAMUSCULAR | Status: AC
  Administered 2020-11-14: 10 mg via INTRAMUSCULAR
  Filled 2020-11-14: qty 1

## 2020-11-14 MED ORDER — IBUPROFEN 600 MG PO TABS
600.0000 mg | ORAL_TABLET | Freq: Four times a day (QID) | ORAL | 0 refills | Status: DC | PRN
Start: 2020-11-14 — End: 2020-12-24

## 2020-11-14 MED ORDER — KETOROLAC TROMETHAMINE 30 MG/ML IJ SOLN
30.0000 mg | Freq: Once | INTRAMUSCULAR | Status: AC
  Administered 2020-11-14: 30 mg via INTRAMUSCULAR
  Filled 2020-11-14: qty 1

## 2020-11-14 MED ORDER — PREDNISONE 10 MG (21) PO TBPK
ORAL_TABLET | ORAL | 0 refills | Status: DC
Start: 2020-11-14 — End: 2020-12-24

## 2020-11-14 NOTE — ED Triage Notes (Signed)
Pt is complaining of right arm pain for last couple days. Reports that it got a lot worse today. Complains of pain mostly in elbow.

## 2020-11-14 NOTE — ED Provider Notes (Signed)
[facility] Provider Note   CSN: [id number] Arrival date & time: 11/14/20  1517     History Chief Complaint  Patient presents with  . Arm Pain    Lynn Wilson is a 39 y.o. female.  Pt presents to the ED today with right elbow pain.  The pt said she has not had any trauma.  It's been hurting for the last several days.  Pain on lateral aspect of elbow.         Past Medical History:  Diagnosis Date  . GERD (gastroesophageal reflux disease)   . Hypertension   . Hypothyroid   . Hypothyroid 01/25/2018  . Iron deficiency anemia due to chronic blood loss 02/16/2018    Patient Active Problem List   Diagnosis Date Noted  . Elevated BP without diagnosis of hypertension 08/27/2020  . Chronic left shoulder pain 02/22/2019  . Chronic gastritis without bleeding 02/22/2019  . Menorrhagia with regular cycle 05/10/2018  . Internal bleeding hemorrhoids 05/10/2018  . Migraine without aura and responsive to treatment 02/16/2018  . Iron deficiency anemia due to chronic blood loss 02/16/2018  . Hyperglycemia 01/29/2018  . Metabolic syndrome 61/60/7371  . Hypertension 01/25/2018  . GERD (gastroesophageal reflux disease) 01/25/2018  . Hypothyroid 01/25/2018  . Morbid obesity ([place]) 01/25/2018    Past Surgical History:  Procedure Laterality Date  . COLONOSCOPY WITH PROPOFOL N/A 09/08/2018   Procedure: COLONOSCOPY WITH PROPOFOL;  Surgeon: Lin Landsman, MD;  Location: [facility] ENDOSCOPY;  Service: Gastroenterology;  Laterality: N/A;  . ESOPHAGOGASTRODUODENOSCOPY (EGD) WITH PROPOFOL N/A 09/08/2018   Procedure: ESOPHAGOGASTRODUODENOSCOPY (EGD) WITH PROPOFOL;  Surgeon: Lin Landsman, MD;  Location: [facility] ENDOSCOPY;  Service: Gastroenterology;  Laterality: N/A;     OB History   No obstetric history on file.     Family History  Problem Relation Age of Onset  . Hypertension Mother   . Hypertension Father   . Stroke Father     Social History   Tobacco  Use  . Smoking status: Current Every Day Smoker    Packs/day: 0.50    Years: 19.00    Pack years: 9.50    Types: Cigarettes    Start date: 01/26/2000  . Smokeless tobacco: Never Used  . Tobacco comment: contemplating   Vaping Use  . Vaping Use: Never used  Substance Use Topics  . Alcohol use: Yes    Alcohol/week: 2.0 standard drinks    Types: 2 Cans of beer per week  . Drug use: No    Home Medications Prior to Admission medications   Medication Sig Start Date End Date Taking? Authorizing Provider  amLODipine (NORVASC) 5 MG tablet Take 1 tablet (5 mg total) by mouth daily. 08/07/19   Hubbard Hartshorn, FNP  baclofen (LIORESAL) 20 MG tablet baclofen 20 mg tablet    [provider]  cyclobenzaprine (FLEXERIL) 10 MG tablet Take 1 tablet (10 mg total) by mouth 3 (three) times daily as needed for muscle spasms. May make drowsy, take predominantly at bedtime, not drive after taking. 10/31/20   Triplett, Johnette Abraham B, FNP  dicyclomine (BENTYL) 20 MG tablet Take 1 tablet (20 mg total) by mouth 4 (four) times daily -  before meals and at bedtime. 08/25/19   Steele Sizer, MD  hydrochlorothiazide (HYDRODIURIL) 12.5 MG tablet Take 1 tablet (12.5 mg total) by mouth daily. 08/07/19   Hubbard Hartshorn, FNP  ibuprofen (ADVIL) 600 MG tablet Take 1 tablet (600 mg total) by mouth every 6 (six) hours  as needed. 11/14/20   Isla Pence, MD  levothyroxine (SYNTHROID) 100 MCG tablet Take 1 tablet (100 mcg total) by mouth every morning. 08/25/19   Steele Sizer, MD  meloxicam (MOBIC) 15 MG tablet Take 1 tablet (15 mg total) by mouth daily. Take as needed for pain, Take with food 08/27/20   Towanda Malkin, MD  predniSONE (STERAPRED UNI-PAK 21 TAB) 10 MG (21) TBPK tablet Take 6 tabs for 2 days, then 5 for 2 days, then 4 for 2 days, then 3 for 2 days, 2 for 2 days, then 1 for 2 days 11/14/20   Isla Pence, MD  traMADol (ULTRAM) 50 MG tablet Take 1 tablet (50 mg total) by mouth every 6 (six) hours as  needed. 10/31/20   Triplett, Johnette Abraham B, FNP  Cetirizine HCl 10 MG CAPS Take 1 capsule (10 mg total) by mouth daily. 03/27/18 06/09/20  Triplett, Dessa Phi, FNP  pantoprazole (PROTONIX) 40 MG tablet Take 1 tablet (40 mg total) by mouth 2 (two) times daily before a meal. 08/25/19 07/07/20  Steele Sizer, MD    Allergies    Celebrex [celecoxib], Sesame seed (diagnostic), and Lisinopril  Review of Systems   Review of Systems  Musculoskeletal:       Right elbow pain  All other systems reviewed and are negative.   Physical Exam Updated Vital Signs BP (!) 146/105 (BP Location: Left Arm)   Pulse 89   Temp 98.2 F (36.8 C) (Oral)   Resp 18   Ht 5\' 5"  (1.651 m)   Wt 106.6 kg   LMP 11/13/2020   SpO2 100%   BMI 39.11 kg/m   Physical Exam Vitals and nursing note reviewed.  Constitutional:      Appearance: Normal appearance.  HENT:     Head: Normocephalic and atraumatic.     Right Ear: External ear normal.     Left Ear: External ear normal.     Nose: Nose normal.     Mouth/Throat:     Mouth: Mucous membranes are moist.     Pharynx: Oropharynx is clear.  Eyes:     Extraocular Movements: Extraocular movements intact.     Conjunctiva/sclera: Conjunctivae normal.     Pupils: Pupils are equal, round, and reactive to light.  Cardiovascular:     Rate and Rhythm: Normal rate and regular rhythm.     Pulses: Normal pulses.     Heart sounds: Normal heart sounds.  Pulmonary:     Effort: Pulmonary effort is normal.     Breath sounds: Normal breath sounds.  Abdominal:     General: Abdomen is flat. Bowel sounds are normal.     Palpations: Abdomen is soft.  Musculoskeletal:     Right elbow: Tenderness present.       Arms:     Cervical back: Normal range of motion and neck supple.  Skin:    General: Skin is warm.     Capillary Refill: Capillary refill takes less than 2 seconds.  Neurological:     General: No focal deficit present.     Mental Status: She is alert and oriented to person,  place, and time.     ED Results / Procedures / Treatments   Labs (all labs ordered are listed, but only abnormal results are displayed) Labs Reviewed - No data to display  EKG None  Radiology DG Elbow Complete Right  Result Date: 11/14/2020 CLINICAL DATA:  Right arm pain. EXAM: RIGHT ELBOW - COMPLETE 3+ VIEW COMPARISON:  11/15/2018.  FINDINGS: No acute bony or joint abnormality. No evidence of fracture or dislocation. No evidence of effusion. IMPRESSION: No acute abnormality. Electronically Signed   By: Marcello Moores  Register   On: 11/14/2020 07:43    Procedures Procedures (including critical care time)  Medications Ordered in ED Medications  ketorolac (TORADOL) 30 MG/ML injection 30 mg (30 mg Intramuscular Given 11/14/20 0725)  dexamethasone (DECADRON) injection 10 mg (10 mg Intramuscular Given 11/14/20 0726)    ED Course  I have reviewed the triage vital signs and the nursing notes.  Pertinent labs & imaging results that were available during my care of the patient were reviewed by me and considered in my medical decision making (see chart for details).    MDM Rules/Calculators/A&P                          X-ray neg.  Pt likely has tendonitis.  She is to f/u with ortho.  Return if worse.   Final Clinical Impression(s) / ED Diagnoses Final diagnoses:  Tendonitis of elbow, right    Rx / DC Orders ED Discharge Orders         Ordered    predniSONE (STERAPRED UNI-PAK 21 TAB) 10 MG (21) TBPK tablet        11/14/20 0747    ibuprofen (ADVIL) 600 MG tablet  Every 6 hours PRN        11/14/20 0747           Isla Pence, MD 11/14/20 [phone]

## 2020-12-24 ENCOUNTER — Ambulatory Visit (INDEPENDENT_AMBULATORY_CARE_PROVIDER_SITE_OTHER): Payer: BLUE CROSS/BLUE SHIELD | Admitting: Family Medicine

## 2020-12-24 ENCOUNTER — Encounter: Payer: Self-pay | Admitting: Family Medicine

## 2020-12-24 DIAGNOSIS — Z113 Encounter for screening for infections with a predominantly sexual mode of transmission: Secondary | ICD-10-CM

## 2020-12-24 DIAGNOSIS — M778 Other enthesopathies, not elsewhere classified: Secondary | ICD-10-CM

## 2020-12-24 DIAGNOSIS — I10 Essential (primary) hypertension: Secondary | ICD-10-CM

## 2020-12-24 DIAGNOSIS — D500 Iron deficiency anemia secondary to blood loss (chronic): Secondary | ICD-10-CM

## 2020-12-24 DIAGNOSIS — E8881 Metabolic syndrome: Secondary | ICD-10-CM | POA: Diagnosis not present

## 2020-12-24 DIAGNOSIS — K219 Gastro-esophageal reflux disease without esophagitis: Secondary | ICD-10-CM

## 2020-12-24 DIAGNOSIS — G8929 Other chronic pain: Secondary | ICD-10-CM

## 2020-12-24 DIAGNOSIS — G43001 Migraine without aura, not intractable, with status migrainosus: Secondary | ICD-10-CM

## 2020-12-24 DIAGNOSIS — E039 Hypothyroidism, unspecified: Secondary | ICD-10-CM

## 2020-12-24 DIAGNOSIS — R7303 Prediabetes: Secondary | ICD-10-CM

## 2020-12-24 DIAGNOSIS — N920 Excessive and frequent menstruation with regular cycle: Secondary | ICD-10-CM

## 2020-12-24 DIAGNOSIS — B349 Viral infection, unspecified: Secondary | ICD-10-CM

## 2020-12-24 DIAGNOSIS — M25512 Pain in left shoulder: Secondary | ICD-10-CM

## 2020-12-24 DIAGNOSIS — E785 Hyperlipidemia, unspecified: Secondary | ICD-10-CM

## 2020-12-24 MED ORDER — MEDROXYPROGESTERONE ACETATE 150 MG/ML IM SUSP: 150.0000 mg | INTRAMUSCULAR | 1 refills | Status: DC

## 2020-12-24 MED ORDER — DICYCLOMINE HCL 20 MG PO TABS: 20.0000 mg | ORAL_TABLET | Freq: Three times a day (TID) | ORAL | 0 refills | Status: DC

## 2020-12-24 MED ORDER — LEVOTHYROXINE SODIUM 100 MCG PO TABS: 100.0000 ug | ORAL_TABLET | ORAL | 1 refills | Status: DC

## 2020-12-24 MED ORDER — IBUPROFEN 600 MG PO TABS: 600.0000 mg | ORAL_TABLET | Freq: Three times a day (TID) | ORAL | 0 refills | Status: DC | PRN

## 2020-12-24 MED ORDER — PANTOPRAZOLE SODIUM 40 MG PO TBEC: 40.0000 mg | DELAYED_RELEASE_TABLET | Freq: Two times a day (BID) | ORAL | 1 refills | Status: DC

## 2020-12-24 MED ORDER — AMLODIPINE BESYLATE 5 MG PO TABS: 5.0000 mg | ORAL_TABLET | Freq: Every day | ORAL | 1 refills | Status: DC

## 2020-12-24 MED ORDER — OLMESARTAN MEDOXOMIL-HCTZ 20-12.5 MG PO TABS: 1.0000 | ORAL_TABLET | Freq: Every day | ORAL | 0 refills | Status: DC

## 2020-12-24 NOTE — Progress Notes (Signed)
Name: Lynn Wilson   MRN: [id number]    DOB: [birth year]   Date:12/24/2020       Progress Note  Subjective  Chief Complaint  Chief Complaint  Patient presents with  . Follow-up    I connected with  Eurika L Tupper on 12/24/20 at  7:40 AM EST by telephone and verified that I am speaking with the correct person using two identifiers.  I discussed the limitations, risks, security and privacy concerns of performing an evaluation and management service by telephone and the availability of in person appointments. Staff also discussed with the patient that there may be a patient responsible charge related to this service. Patient agreed on having a virtual visit  Patient Location: parking lot Provider Location: [facility] Additional Individuals present: alone   HPI  Leftchronic shoulder pain -She states she has a long history of left shoulder pain,  and developed pain around 2020 , she saw Ortho, had CT shoulder that was negative for a tear, she is still having daily pain  Right elbow tendonitis: she went to [facility]/2020 because of worsening of pain on right elbow pain and diagnosed . She works as a Clinical research associate for [employer]. Pain on right elbow is constant, she states even gripping her phone causes severe pain, she is using left hand instead of using right arm. Worse with movement . Pain can go up to a 10 with certain movements. Wife is disabled now and she has to assist her and has aggravating symptoms   Migraine: she states only had a couple of episodes in 2020, she takes muscle relaxer prn for migraine   HTN: sheis back on HCTZ 12.5 and norvasc 5 mg, bp has been elevated at [facility] and with Dr. Roxan Hockey last visit l. She denies palpitation or chest pain  Tobacco WUJ:WJXBJYN 1 pack daily. She has episodes of bronchitis, she denies cough, wheezing or SOB  Hypothyroidism: sheis compliant with thyroid medication.No change in bowel movements she has chronic dry skin, she was going to a  [facility] center but states not sure of the results of her last labs   Iron deficiency anemia: regular but heavy cycles even clots, used to get iron infusion, she is willing to try Depo nowShe also has hemorrhoidswithout rectal bleeding, chronic gastritis per EGD, seen by Dr. Marius Ditch and is taking Protonix once daily now and symptoms are controlled. She is willing to try Depo   Pre-diabetes/Metabolic Syndrome: shehas episode of polyphagia, but no  polydipsia or polyuria. Wife has uncontrolled DM, they are trying to follow a diabetic diet    Patient Active Problem List   Diagnosis Date Noted  . Elevated BP without diagnosis of hypertension 08/27/2020  . Chronic left shoulder pain 02/22/2019  . Chronic gastritis without bleeding 02/22/2019  . Menorrhagia with regular cycle 05/10/2018  . Internal bleeding hemorrhoids 05/10/2018  . Migraine without aura and responsive to treatment 02/16/2018  . Iron deficiency anemia due to chronic blood loss 02/16/2018  . Hyperglycemia 01/29/2018  . Metabolic syndrome 82/95/6213  . Hypertension 01/25/2018  . GERD (gastroesophageal reflux disease) 01/25/2018  . Hypothyroid 01/25/2018  . Morbid obesity ([place]) 01/25/2018    Past Surgical History:  Procedure Laterality Date  . COLONOSCOPY WITH PROPOFOL N/A 09/08/2018   Procedure: COLONOSCOPY WITH PROPOFOL;  Surgeon: Lin Landsman, MD;  Location: [facility] ENDOSCOPY;  Service: Gastroenterology;  Laterality: N/A;  . ESOPHAGOGASTRODUODENOSCOPY (EGD) WITH PROPOFOL N/A 09/08/2018   Procedure: ESOPHAGOGASTRODUODENOSCOPY (EGD) WITH PROPOFOL;  Surgeon: Lin Landsman,  MD;  Location: [facility] ENDOSCOPY;  Service: Gastroenterology;  Laterality: N/A;    Family History  Problem Relation Age of Onset  . Hypertension Mother   . Hypertension Father   . Stroke Father     Social History   Socioeconomic History  . Marital status: Married    Spouse name: Windy Canny  . Number of children: 0  .  Years of education: Not on file  . Highest education level: Associate degree: occupational, Hotel manager, or vocational program  Occupational History  . Occupation: Chiropractor: [place]  Tobacco Use  . Smoking status: Current Every Day Smoker    Packs/day: 0.50    Years: 19.00    Pack years: 9.50    Types: Cigarettes    Start date: 01/26/2000  . Smokeless tobacco: Never Used  . Tobacco comment: contemplating   Vaping Use  . Vaping Use: Never used  Substance and Sexual Activity  . Alcohol use: Yes    Alcohol/week: 2.0 standard drinks    Types: 2 Cans of beer per week  . Drug use: No  . Sexual activity: Yes    Birth control/protection: Other-see comments    Comment: homosexual   Other Topics Concern  . Not on file  Social History Narrative   She moved to [place] from [place], [place] in the Summer of 2018   She lives with her wife ( married since 02/19/2018 - homosexual    She does not have any children, but girlfriend has an adult daughter    She is now working at [employer] Strain: Not on [employer] Insecurity: Not on file  Transportation Needs: Not on file  Physical Activity: Not on file  Stress: Not on file  Social Connections: Not on file  Intimate Partner Violence: Not on file     Current Outpatient Medications:  .  amLODipine (NORVASC) 5 MG tablet, Take 1 tablet (5 mg total) by mouth daily., Disp: 90 tablet, Rfl: 1 .  baclofen (LIORESAL) 20 MG tablet, baclofen 20 mg tablet, Disp: , Rfl:  .  cyclobenzaprine (FLEXERIL) 10 MG tablet, Take 1 tablet (10 mg total) by mouth 3 (three) times daily as needed for muscle spasms. May make drowsy, take predominantly at bedtime, not drive after taking., Disp: 30 tablet, Rfl: 0 .  dicyclomine (BENTYL) 20 MG tablet, Take 1 tablet (20 mg total) by mouth 4 (four) times daily -  before meals and at bedtime., Disp: 40 tablet, Rfl: 0 .  hydrochlorothiazide (HYDRODIURIL) 12.5 MG tablet,  Take 1 tablet (12.5 mg total) by mouth daily., Disp: 90 tablet, Rfl: 1 .  ibuprofen (ADVIL) 600 MG tablet, Take 1 tablet (600 mg total) by mouth every 6 (six) hours as needed., Disp: 30 tablet, Rfl: 0 .  levothyroxine (SYNTHROID) 100 MCG tablet, Take 1 tablet (100 mcg total) by mouth every morning., Disp: 90 tablet, Rfl: 1 .  meloxicam (MOBIC) 15 MG tablet, Take 1 tablet (15 mg total) by mouth daily. Take as needed for pain, Take with food (Patient not taking: Reported on 12/24/2020), Disp: 30 tablet, Rfl: 1 .  predniSONE (STERAPRED UNI-PAK 21 TAB) 10 MG (21) TBPK tablet, Take 6 tabs for 2 days, then 5 for 2 days, then 4 for 2 days, then 3 for 2 days, 2 for 2 days, then 1 for 2 days (Patient not taking: Reported on 12/24/2020), Disp: 42 tablet, Rfl: 0 .  traMADol (ULTRAM) 50 MG  tablet, Take 1 tablet (50 mg total) by mouth every 6 (six) hours as needed. (Patient not taking: Reported on 12/24/2020), Disp: 12 tablet, Rfl: 0  Allergies  Allergen Reactions  . Celebrex [Celecoxib]     "shakes/tremors"  . Sesame Seed (Diagnostic) Itching  . Lisinopril Rash    I personally reviewed active problem list, medication list, allergies, family history, social history, health maintenance with the patient/caregiver today.   ROS  Ten systems reviewed and is negative except as mentioned in HPI   Objective  Virtual encounter, vitals not obtained.  There is no height or weight on file to calculate BMI.  Physical Exam  Awake, alert and oriented  PHQ2/9: Depression screen [facility] 2/9 12/24/2020 08/27/2020 08/27/2020 08/25/2019 08/07/2019  Decreased Interest 0 0 0 0 0  Down, Depressed, Hopeless 3 1 0 0 0  PHQ - 2 Score 3 1 0 0 0  Altered sleeping 3 - - 2 0  Tired, decreased energy 3 - - 2 0  Change in appetite 3 - - 2 0  Feeling bad or failure about yourself  0 - - 0 0  Trouble concentrating 0 - - 0 0  Moving slowly or fidgety/restless 0 - - 0 0  Suicidal thoughts 1 - - 0 0  PHQ-9 Score 13 - - 6 0   Difficult doing work/chores - - - Somewhat difficult Not difficult at all   PHQ-2/9 Result is positive.  She states situational and does not want medication   Fall Risk: Fall Risk  12/24/2020 08/27/2020 08/25/2019 08/07/2019 07/12/2019  Falls in the past year? 0 0 0 0 0  Number falls in past yr: 0 0 0 0 0  Injury with Fall? 0 0 0 0 0  Comment - - - - -  Follow up - Falls evaluation completed - Falls evaluation completed -     Assessment & Plan  1. Right elbow tendonitis  - Ambulatory referral to Physical Therapy  2. Essential hypertension  - amLODipine (NORVASC) 5 MG tablet; Take 1 tablet (5 mg total) by mouth daily.  Dispense: 90 tablet; Refill: 1 - Microalbumin / creatinine urine ratio - COMPLETE METABOLIC PANEL WITH GFR - olmesartan-hydrochlorothiazide (BENICAR HCT) 20-12.5 MG tablet; Take 1 tablet by mouth daily.  Dispense: 30 tablet; Refill: 0  3. Metabolic syndrome   4. Hypothyroidism, adult  - TSH - levothyroxine (SYNTHROID) 100 MCG tablet; Take 1 tablet (100 mcg total) by mouth every morning.  Dispense: 90 tablet; Refill: 1  5. Migraine without aura and with status migrainosus, not intractable   6. Gastroesophageal reflux disease without esophagitis  - dicyclomine (BENTYL) 20 MG tablet; Take 1 tablet (20 mg total) by mouth 4 (four) times daily -  before meals and at bedtime.  Dispense: 40 tablet; Refill: 0 - pantoprazole (PROTONIX) 40 MG tablet; Take 1 tablet (40 mg total) by mouth 2 (two) times daily before a meal.  Dispense: 90 tablet; Refill: 1  7. Chronic left shoulder pain   8. Viral syndrome  Waiting for COVID-19 results, symptoms started 6 days ago   9. Pre-diabetes  - Hemoglobin A1c  10. Screening for STDs (sexually transmitted diseases)  - HIV Antibody (routine testing w rflx) - Hepatitis C antibody - RPR  11. Iron deficiency anemia due to chronic blood loss  - CBC with Differential/Platelet - Iron, TIBC and Ferritin Panel  12.  Dyslipidemia  - Lipid panel  13. Menorrhagia with regular cycle  - medroxyPROGESTERone (DEPO-PROVERA) 150 MG/ML injection; Inject  1 mL (150 mg total) into the muscle every 3 (three) months.  Dispense: 1 mL; Refill: 1  I discussed the assessment and treatment plan with the patient. The patient was provided an opportunity to ask questions and all were answered. The patient agreed with the plan and demonstrated an understanding of the instructions.   The patient was advised to call back or seek an in-person evaluation if the symptoms worsen or if the condition fails to improve as anticipated.  I provided 25 minutes of non-face-to-face time during this encounter.  Loistine Chance, MD

## 2020-12-30 ENCOUNTER — Ambulatory Visit: Payer: BLUE CROSS/BLUE SHIELD

## 2021-02-03 ENCOUNTER — Other Ambulatory Visit: Payer: Self-pay

## 2021-02-03 ENCOUNTER — Ambulatory Visit (INDEPENDENT_AMBULATORY_CARE_PROVIDER_SITE_OTHER): Payer: BLUE CROSS/BLUE SHIELD

## 2021-02-03 DIAGNOSIS — N920 Excessive and frequent menstruation with regular cycle: Secondary | ICD-10-CM

## 2021-02-03 MED ORDER — MEDROXYPROGESTERONE ACETATE 150 MG/ML IM SUSP: 150.0000 mg | Freq: Once | INTRAMUSCULAR | Status: DC

## 2021-02-03 MED ORDER — MEDROXYPROGESTERONE ACETATE 150 MG/ML IM SUSY
150.0000 mg | PREFILLED_SYRINGE | Freq: Once | INTRAMUSCULAR | Status: AC
  Administered 2021-02-03: 150 mg via INTRAMUSCULAR

## 2021-02-16 ENCOUNTER — Other Ambulatory Visit: Payer: Self-pay

## 2021-02-16 ENCOUNTER — Emergency Department
Admission: EM | Admit: 2021-02-16 | Discharge: 2021-02-16 | Disposition: A | Discharge status: Home / Self Care | Payer: BLUE CROSS/BLUE SHIELD | Attending: Emergency Medicine | Admitting: Emergency Medicine

## 2021-02-16 DIAGNOSIS — F17210 Nicotine dependence, cigarettes, uncomplicated: Secondary | ICD-10-CM | POA: Diagnosis not present

## 2021-02-16 DIAGNOSIS — Z79899 Other long term (current) drug therapy: Secondary | ICD-10-CM | POA: Insufficient documentation

## 2021-02-16 DIAGNOSIS — I10 Essential (primary) hypertension: Secondary | ICD-10-CM | POA: Insufficient documentation

## 2021-02-16 DIAGNOSIS — R109 Unspecified abdominal pain: Secondary | ICD-10-CM | POA: Insufficient documentation

## 2021-02-16 DIAGNOSIS — R630 Anorexia: Secondary | ICD-10-CM | POA: Diagnosis not present

## 2021-02-16 DIAGNOSIS — R197 Diarrhea, unspecified: Secondary | ICD-10-CM | POA: Insufficient documentation

## 2021-02-16 DIAGNOSIS — E039 Hypothyroidism, unspecified: Secondary | ICD-10-CM | POA: Diagnosis not present

## 2021-02-16 DIAGNOSIS — K219 Gastro-esophageal reflux disease without esophagitis: Secondary | ICD-10-CM | POA: Diagnosis not present

## 2021-02-16 LAB — URINALYSIS, COMPLETE (UACMP) WITH MICROSCOPIC
Bacteria, UA: NONE SEEN
Bilirubin Urine: NEGATIVE
Glucose, UA: NEGATIVE mg/dL
Ketones, ur: NEGATIVE mg/dL
Leukocytes,Ua: NEGATIVE
Nitrite: NEGATIVE
Protein, ur: NEGATIVE mg/dL
Specific Gravity, Urine: 1.017 (ref 1.005–1.030)
pH: 5 (ref 5.0–8.0)

## 2021-02-16 LAB — COMPREHENSIVE METABOLIC PANEL
ALT: 16 U/L (ref 0–44)
AST: 23 U/L (ref 15–41)
Albumin: 3.8 g/dL (ref 3.5–5.0)
Alkaline Phosphatase: 52 U/L (ref 38–126)
Anion gap: 8 (ref 5–15)
BUN: 10 mg/dL (ref 6–20)
CO2: 19 mmol/L — ABNORMAL LOW (ref 22–32)
Calcium: 8.8 mg/dL — ABNORMAL LOW (ref 8.9–10.3)
Chloride: 110 mmol/L (ref 98–111)
Creatinine, Ser: 0.71 mg/dL (ref 0.44–1.00)
GFR, Estimated: >60 mL/min (ref >60)
Glucose, Bld: 108 mg/dL — ABNORMAL HIGH (ref 70–99)
Potassium: 3.8 mmol/L (ref 3.5–5.1)
Sodium: 137 mmol/L (ref 135–145)
Total Bilirubin: 0.5 mg/dL (ref 0.3–1.2)
Total Protein: 7.8 g/dL (ref 6.5–8.1)

## 2021-02-16 LAB — CBC
HCT: 31 % — ABNORMAL LOW (ref 36.0–46.0)
Hemoglobin: 9.9 g/dL — ABNORMAL LOW (ref 12.0–15.0)
MCH: 21.7 pg — ABNORMAL LOW (ref 26.0–34.0)
MCHC: 31.9 g/dL (ref 30.0–36.0)
MCV: 67.8 fL — ABNORMAL LOW (ref 80.0–100.0)
Platelets: 421 10*3/uL — ABNORMAL HIGH (ref 150–400)
RBC: 4.57 MIL/uL (ref 3.87–5.11)
RDW: 20 % — ABNORMAL HIGH (ref 11.5–15.5)
WBC: 6.3 10*3/uL (ref 4.0–10.5)
nRBC: 0 % (ref 0.0–0.2)

## 2021-02-16 LAB — LIPASE, BLOOD: Lipase: 36 U/L (ref 11–51)

## 2021-02-16 LAB — PREGNANCY, URINE: Preg Test, Ur: NEGATIVE

## 2021-02-16 MED ORDER — DICYCLOMINE HCL 10 MG PO CAPS: 10.0000 mg | ORAL_CAPSULE | Freq: Three times a day (TID) | ORAL | 0 refills | Status: DC | PRN

## 2021-02-16 MED ORDER — DICYCLOMINE HCL 10 MG PO CAPS
20.0000 mg | ORAL_CAPSULE | Freq: Once | ORAL | Status: AC
  Administered 2021-02-16: 20 mg via ORAL
  Filled 2021-02-16: qty 2

## 2021-02-16 MED ORDER — ONDANSETRON HCL 4 MG PO TABS: 4.0000 mg | ORAL_TABLET | Freq: Three times a day (TID) | ORAL | 0 refills | Status: DC | PRN

## 2021-02-16 NOTE — Discharge Instructions (Addendum)
Please seek medical attention for any high fevers, chest pain, shortness of breath, change in behavior, persistent vomiting, bloody stool or any other new or concerning symptoms.  

## 2021-02-16 NOTE — ED Notes (Signed)
D/C and new RX discussed with pt, pt verbalized understanding. NAD noted. Pt ambulatory with steady gait on D/C.

## 2021-02-16 NOTE — ED Notes (Signed)
Pt presents to ED with c/o of having L sided flank and ABD pain that started this Friday. Pt states ain is cramping in nature. Pt states also having diarrhea and nausea after eating. Pt denies fevers or chills. Pt denies any urinary symptoms to this RN. Pt denies HX of kidney stones. Pt is A&Ox4. NAD noted. Pt states HX of hypertension and GERD.

## 2021-02-16 NOTE — ED Triage Notes (Signed)
Pt c/o left sided abd pain with Nausea and diarrhea since Friday.

## 2021-02-16 NOTE — ED Provider Notes (Signed)
[facility] Emergency Department Provider Note    ____________________________________________   I have reviewed the triage vital signs and the nursing notes.   HISTORY  Chief Complaint Abdominal Pain   History limited by: Not Limited   HPI Lynn Wilson is a 40 y.o. female who presents to the emergency department today because of concerns for abdominal pain.  The pain is located in her left abdomen.  It started more in the flank and has now moved more the thr front left of her abdomen.  She states it did wake her from sleep days ago.  The patient has had some decreased appetite.  Additionally she has noticed some nonbloody diarrhea.  She denies any fevers.  Denies any unusual ingestions or activity prior to the pain starting.  She denies any history of significant abdominal illness or similar pain in the past.    Records reviewed. Per medical record review patient has a history of HTN, GERD.   Past Medical History:  Diagnosis Date  . GERD (gastroesophageal reflux disease)   . Hypertension   . Hypothyroid   . Hypothyroid 01/25/2018  . Iron deficiency anemia due to chronic blood loss 02/16/2018    Patient Active Problem List   Diagnosis Date Noted  . Elevated BP without diagnosis of hypertension 08/27/2020  . Chronic left shoulder pain 02/22/2019  . Chronic gastritis without bleeding 02/22/2019  . Menorrhagia with regular cycle 05/10/2018  . Internal bleeding hemorrhoids 05/10/2018  . Migraine without aura and responsive to treatment 02/16/2018  . Iron deficiency anemia due to chronic blood loss 02/16/2018  . Hyperglycemia 01/29/2018  . Metabolic syndrome 50/93/2671  . Hypertension 01/25/2018  . GERD (gastroesophageal reflux disease) 01/25/2018  . Hypothyroid 01/25/2018  . Morbid obesity ([place]) 01/25/2018    Past Surgical History:  Procedure Laterality Date  . COLONOSCOPY WITH PROPOFOL N/A 09/08/2018   Procedure: COLONOSCOPY WITH PROPOFOL;   Surgeon: Lin Landsman, MD;  Location: [facility] ENDOSCOPY;  Service: Gastroenterology;  Laterality: N/A;  . ESOPHAGOGASTRODUODENOSCOPY (EGD) WITH PROPOFOL N/A 09/08/2018   Procedure: ESOPHAGOGASTRODUODENOSCOPY (EGD) WITH PROPOFOL;  Surgeon: Lin Landsman, MD;  Location: [facility] ENDOSCOPY;  Service: Gastroenterology;  Laterality: N/A;    Prior to Admission medications   Medication Sig Start Date End Date Taking? Authorizing Provider  amLODipine (NORVASC) 5 MG tablet Take 1 tablet (5 mg total) by mouth daily. 12/24/20   Steele Sizer, MD  cyclobenzaprine (FLEXERIL) 10 MG tablet Take 1 tablet (10 mg total) by mouth 3 (three) times daily as needed for muscle spasms. May make drowsy, take predominantly at bedtime, not drive after taking. 10/31/20   Triplett, Johnette Abraham B, FNP  dicyclomine (BENTYL) 20 MG tablet Take 1 tablet (20 mg total) by mouth 4 (four) times daily -  before meals and at bedtime. 12/24/20   Steele Sizer, MD  ibuprofen (ADVIL) 600 MG tablet Take 1 tablet (600 mg total) by mouth every 8 (eight) hours as needed for moderate pain. 12/24/20   Steele Sizer, MD  levothyroxine (SYNTHROID) 100 MCG tablet Take 1 tablet (100 mcg total) by mouth every morning. 12/24/20   Steele Sizer, MD  medroxyPROGESTERone (DEPO-PROVERA) 150 MG/ML injection Inject 1 mL (150 mg total) into the muscle every 3 (three) months. 12/24/20   Steele Sizer, MD  olmesartan-hydrochlorothiazide (BENICAR HCT) 20-12.5 MG tablet Take 1 tablet by mouth daily. 12/24/20   Steele Sizer, MD  pantoprazole (PROTONIX) 40 MG tablet Take 1 tablet (40 mg total) by mouth 2 (two) times daily before  a meal. 12/24/20   Sowles, Drue Stager, MD  Cetirizine HCl 10 MG CAPS Take 1 capsule (10 mg total) by mouth daily. 03/27/18 06/09/20  Sherrie George B, FNP    Allergies Celebrex [celecoxib], Sesame seed (diagnostic), and Lisinopril  Family History  Problem Relation Age of Onset  . Hypertension Mother   . Hypertension Father   . Stroke  Father     Social History Social History   Tobacco Use  . Smoking status: Current Every Day Smoker    Packs/day: 0.75    Years: 20.00    Pack years: 15.00    Types: Cigarettes    Start date: 01/26/2000  . Smokeless tobacco: Never Used  Vaping Use  . Vaping Use: Never used  Substance Use Topics  . Alcohol use: Yes    Alcohol/week: 2.0 standard drinks    Types: 2 Cans of beer per week  . Drug use: No    Review of Systems Constitutional: No fever/chills Eyes: No visual changes. ENT: No sore throat. Cardiovascular: Denies chest pain. Respiratory: Denies shortness of breath. Gastrointestinal: Positive for abdominal pain. Positive for diarrhea and decreased appetite.  Genitourinary: Negative for dysuria. Musculoskeletal: Negative for back pain. Skin: Negative for rash. Neurological: Negative for headaches, focal weakness or numbness.  ____________________________________________   PHYSICAL EXAM:  VITAL SIGNS: ED Triage Vitals [02/16/21 0734]  Enc Vitals Group     BP (!) 149/93     Pulse Rate 78     Resp 18     Temp 98.2 F (36.8 C)     Temp Source Oral     SpO2 99 %     Weight 235 lb (106.6 kg)     Height 5\' 4"  (1.626 m)    Constitutional: Alert and oriented.  Eyes: Conjunctivae are normal.  ENT      Head: Normocephalic and atraumatic.      Nose: No congestion/rhinnorhea.      Mouth/Throat: Mucous membranes are moist.      Neck: No stridor. Hematological/Lymphatic/Immunilogical: No cervical lymphadenopathy. Cardiovascular: Normal rate, regular rhythm.  No murmurs, rubs, or gallops.  Respiratory: Normal respiratory effort without tachypnea nor retractions. Breath sounds are clear and equal bilaterally. No wheezes/rales/rhonchi. Gastrointestinal: Soft and minimally tender in the left abdomen.  Genitourinary: Deferred Musculoskeletal: Normal range of motion in all extremities. No lower extremity edema. Neurologic:  Normal speech and language. No gross focal  neurologic deficits are appreciated.  Skin:  Skin is warm, dry and intact. No rash noted. Psychiatric: Mood and affect are normal. Speech and behavior are normal. Patient exhibits appropriate insight and judgment.  ____________________________________________    LABS (pertinent positives/negatives)  CMP wnl except co2 19, glu 108, ca 8.8 CBC wbc 6.3, hgb 9.9, plt 421 Lipase 36 Upreg negative UA clear, small hgb dipstick, otherwise unremarkable ____________________________________________   EKG  INance Pear, attending physician, personally viewed and interpreted this EKG  EKG Time: 0738 Rate: 76 Rhythm: normal sinus rhythm Axis: normal Intervals: qtc 418 QRS: narrow ST changes: no st elevation Impression: normal ekg  ____________________________________________    RADIOLOGY  None  ____________________________________________   PROCEDURES  Procedures  ____________________________________________   INITIAL IMPRESSION / ASSESSMENT AND PLAN / ED COURSE  Pertinent labs & imaging results that were available during my care of the patient were reviewed by me and considered in my medical decision making (see chart for details).   Patient presented to the emergency department today because of 3-day history of abdominal pain.  Seen patient did  have some mild tenderness to the left side of her abdomen.  No leukocytosis or fever to suggest significant intra-abdominal infection did have a discussion with the patient.  At this point will plan on deferring imaging she did feel better after Bentyl.  Do think gastroenteritis likely.  We did discuss return precautions.    ____________________________________________   FINAL CLINICAL IMPRESSION(S) / ED DIAGNOSES  Final diagnoses:  Abdominal pain, unspecified abdominal location     Note: This dictation was prepared with Dragon dictation. Any transcriptional errors that result from this process are unintentional      Nance Pear, MD 02/16/21 1337

## 2021-03-10 ENCOUNTER — Emergency Department
Admission: EM | Admit: 2021-03-10 | Discharge: 2021-03-10 | Disposition: A | Discharge status: Home / Self Care | Payer: BLUE CROSS/BLUE SHIELD | Attending: Emergency Medicine | Admitting: Emergency Medicine

## 2021-03-10 ENCOUNTER — Other Ambulatory Visit: Payer: Self-pay

## 2021-03-10 ENCOUNTER — Encounter: Payer: Self-pay | Admitting: Emergency Medicine

## 2021-03-10 DIAGNOSIS — E039 Hypothyroidism, unspecified: Secondary | ICD-10-CM | POA: Insufficient documentation

## 2021-03-10 DIAGNOSIS — R202 Paresthesia of skin: Secondary | ICD-10-CM | POA: Insufficient documentation

## 2021-03-10 DIAGNOSIS — Z79899 Other long term (current) drug therapy: Secondary | ICD-10-CM | POA: Diagnosis not present

## 2021-03-10 DIAGNOSIS — M7711 Lateral epicondylitis, right elbow: Secondary | ICD-10-CM | POA: Diagnosis not present

## 2021-03-10 DIAGNOSIS — I10 Essential (primary) hypertension: Secondary | ICD-10-CM | POA: Insufficient documentation

## 2021-03-10 DIAGNOSIS — M778 Other enthesopathies, not elsewhere classified: Secondary | ICD-10-CM

## 2021-03-10 DIAGNOSIS — F17210 Nicotine dependence, cigarettes, uncomplicated: Secondary | ICD-10-CM | POA: Diagnosis not present

## 2021-03-10 DIAGNOSIS — M25521 Pain in right elbow: Secondary | ICD-10-CM | POA: Diagnosis present

## 2021-03-10 MED ORDER — IBUPROFEN 800 MG PO TABS: 800.0000 mg | ORAL_TABLET | Freq: Three times a day (TID) | ORAL | 0 refills | Status: DC | PRN

## 2021-03-10 NOTE — Discharge Instructions (Signed)
Your exam is consistent with tendinitis of the elbow.  This condition, also known as tennis elbow, causes tenderness and pain to the lateral part of the elbow with touch and movement of the wrist.  Take the anti-inflammatory as directed, elevate the splint as discussed.  You may follow-up with your primary provider or Ortho, for ongoing symptoms.

## 2021-03-10 NOTE — ED Triage Notes (Signed)
C/O right elbow pain radiating down to right hand.  States pain started yesterday after moving furniture.

## 2021-03-10 NOTE — ED Provider Notes (Signed)
[facility] Emergency Department Provider Note ____________________________________________  Time seen: 1035  I have reviewed the triage vital signs and the nursing notes.  HISTORY  Chief Complaint  Arm Pain  HPI Lynn Wilson is a 40 y.o. female presents her cell to the ED for evaluation of pain to the lateral right elbow.  Patient denies any direct trauma to the arm, but does report activities of the week and moving furniture.  She describes pain to the lateral epicondyle on palpation.  Flexion extension range of the wrist also elicit some pain.  She reports some mild distal paresthesias but denies any distal hand swelling, skin temp/color changes.  She denies any history of previous elbow tendinitis on the right.  She does however, report a history of tendinitis on the left, treated with a single steroid injection, without significant benefit.   Past Medical History:  Diagnosis Date  . GERD (gastroesophageal reflux disease)   . Hypertension   . Hypothyroid   . Hypothyroid 01/25/2018  . Iron deficiency anemia due to chronic blood loss 02/16/2018    Patient Active Problem List   Diagnosis Date Noted  . Elevated BP without diagnosis of hypertension 08/27/2020  . Chronic left shoulder pain 02/22/2019  . Chronic gastritis without bleeding 02/22/2019  . Menorrhagia with regular cycle 05/10/2018  . Internal bleeding hemorrhoids 05/10/2018  . Migraine without aura and responsive to treatment 02/16/2018  . Iron deficiency anemia due to chronic blood loss 02/16/2018  . Hyperglycemia 01/29/2018  . Metabolic syndrome 43/32/9518  . Hypertension 01/25/2018  . GERD (gastroesophageal reflux disease) 01/25/2018  . Hypothyroid 01/25/2018  . Morbid obesity ([place]) 01/25/2018    Past Surgical History:  Procedure Laterality Date  . COLONOSCOPY WITH PROPOFOL N/A 09/08/2018   Procedure: COLONOSCOPY WITH PROPOFOL;  Surgeon: Lin Landsman, MD;  Location: [facility]  ENDOSCOPY;  Service: Gastroenterology;  Laterality: N/A;  . ESOPHAGOGASTRODUODENOSCOPY (EGD) WITH PROPOFOL N/A 09/08/2018   Procedure: ESOPHAGOGASTRODUODENOSCOPY (EGD) WITH PROPOFOL;  Surgeon: Lin Landsman, MD;  Location: [facility] ENDOSCOPY;  Service: Gastroenterology;  Laterality: N/A;    Prior to Admission medications   Medication Sig Start Date End Date Taking? Authorizing Provider  ibuprofen (ADVIL) 800 MG tablet Take 1 tablet (800 mg total) by mouth every 8 (eight) hours as needed. 03/10/21  Yes Marissia Blackham, Dannielle Karvonen, PA-C  amLODipine (NORVASC) 5 MG tablet Take 1 tablet (5 mg total) by mouth daily. 12/24/20   Steele Sizer, MD  cyclobenzaprine (FLEXERIL) 10 MG tablet Take 1 tablet (10 mg total) by mouth 3 (three) times daily as needed for muscle spasms. May make drowsy, take predominantly at bedtime, not drive after taking. 10/31/20   Triplett, Johnette Abraham B, FNP  dicyclomine (BENTYL) 10 MG capsule Take 1 capsule (10 mg total) by mouth 3 (three) times daily as needed for up to 14 days (abdominal pain). 02/16/21 03/02/21  Nance Pear, MD  dicyclomine (BENTYL) 20 MG tablet Take 1 tablet (20 mg total) by mouth 4 (four) times daily -  before meals and at bedtime. 12/24/20   Steele Sizer, MD  levothyroxine (SYNTHROID) 100 MCG tablet Take 1 tablet (100 mcg total) by mouth every morning. 12/24/20   Steele Sizer, MD  medroxyPROGESTERone (DEPO-PROVERA) 150 MG/ML injection Inject 1 mL (150 mg total) into the muscle every 3 (three) months. 12/24/20   Steele Sizer, MD  olmesartan-hydrochlorothiazide (BENICAR HCT) 20-12.5 MG tablet Take 1 tablet by mouth daily. 12/24/20   Steele Sizer, MD  ondansetron (ZOFRAN) 4 MG tablet Take  1 tablet (4 mg total) by mouth every 8 (eight) hours as needed. 02/16/21   Nance Pear, MD  pantoprazole (PROTONIX) 40 MG tablet Take 1 tablet (40 mg total) by mouth 2 (two) times daily before a meal. 12/24/20   Ancil Boozer, Drue Stager, MD  Cetirizine HCl 10 MG CAPS Take 1 capsule  (10 mg total) by mouth daily. 03/27/18 06/09/20  Sherrie George B, FNP    Allergies Celebrex [celecoxib], Sesame seed (diagnostic), and Lisinopril  Family History  Problem Relation Age of Onset  . Hypertension Mother   . Hypertension Father   . Stroke Father     Social History Social History   Tobacco Use  . Smoking status: Current Every Day Smoker    Packs/day: 0.75    Years: 20.00    Pack years: 15.00    Types: Cigarettes    Start date: 01/26/2000  . Smokeless tobacco: Never Used  Vaping Use  . Vaping Use: Never used  Substance Use Topics  . Alcohol use: Yes    Alcohol/week: 2.0 standard drinks    Types: 2 Cans of beer per week  . Drug use: No    Review of Systems  Constitutional: Negative for fever. Cardiovascular: Negative for chest pain. Respiratory: Negative for shortness of breath. Gastrointestinal: Negative for abdominal pain, vomiting and diarrhea. Genitourinary: Negative for dysuria. Musculoskeletal: Negative for back pain. Right elbow pain as above Skin: Negative for rash. Neurological: Negative for headaches, focal weakness or numbness. ____________________________________________  PHYSICAL EXAM:  VITAL SIGNS: ED Triage Vitals  Enc Vitals Group     BP 03/10/21 1007 131/87     Pulse Rate 03/10/21 1035 78     Resp 03/10/21 1007 16     Temp 03/10/21 1007 97.7 F (36.5 C)     Temp Source 03/10/21 1007 Oral     SpO2 03/10/21 1007 100 %     Weight 03/10/21 1005 235 lb 14.3 oz (107 kg)     Height 03/10/21 1005 5\' 4"  (1.626 m)     Head Circumference --      Peak Flow --      Pain Score 03/10/21 1004 7     Pain Loc --      Pain Edu? --      Excl. in [place]? --     Constitutional: Alert and oriented. Well appearing and in no distress. Head: Normocephalic and atraumatic. Eyes: Conjunctivae are normal. Normal extraocular movements Neck: Supple. Normal ROM without crepitus Cardiovascular: Normal rate, regular rhythm. Normal distal pulses and cap  refill. Respiratory: Normal respiratory effort. No wheezes/rales/rhonchi. Musculoskeletal: Right upper extremity without obvious deformity, dislocation, or joint effusion about the elbow.  Patient with normal composite fist distally.  Flexion extension range does elicit some lateral epicondylar tenderness to the forearm.  Tenderness to palp at the lateral epicondyle on the right.  Nontender with normal range of motion in all extremities.  Neurologic:  CN II-XII grossly intact. Normal UE DTRs bilaterally. negative cubital/carpal Tinel's. Normal gait without ataxia. Normal speech and language. No gross focal neurologic deficits are appreciated. Skin:  Skin is warm, dry and intact. No rash noted. ____________________________________________  PROCEDURES  Wrist cock-up splint  Procedures ____________________________________________  INITIAL IMPRESSION / ASSESSMENT AND PLAN / ED COURSE  DDX: lateral epicondylitis, elbow contusion, elbow dislocation, forearm sprain  Patient ED evaluation of lateral epicondylitis on the right, consistent with a likely epicondylitis.  Exam is overall benign and reassuring and shows no acute neuromuscular deficits.  Patient will be placed  in a wrist cock-up splint as we do not have a account of her strength at this time.  She is referred to however, to orthopedics for ongoing evaluation management.  She should take over-the-counter Tylenol or ibuprofen ibuprofen as needed.  She is also given instruction on the use of forearm exercises and ice to help reduce swelling.  She will follow-up with primary provider or Ortho as discussed.   Tory Mckissack Larose was evaluated in Emergency Department on 03/10/2021 for the symptoms described in the history of present illness. She was evaluated in the context of the global COVID-19 pandemic, which necessitated consideration that the patient might be at risk for infection with the SARS-CoV-2 virus that causes COVID-19. Institutional  protocols and algorithms that pertain to the evaluation of patients at risk for COVID-19 are in a state of rapid change based on information released by regulatory bodies including the CDC and federal and state organizations. These policies and algorithms were followed during the patient's care in the ED. ____________________________________________  FINAL CLINICAL IMPRESSION(S) / ED DIAGNOSES  Final diagnoses:  Right elbow tendinitis  Lateral epicondylitis of right elbow      Carmie End, Dannielle Karvonen, PA-C 03/10/21 1134    Blake Divine, MD 03/10/21 1529

## 2021-03-10 NOTE — ED Notes (Signed)
See triage note   Presents with right arm pain  States pain radiates from elbow into her right hand  Denies any fall  But pain started after moving furniture  Good pulses   No deformity

## 2021-03-11 ENCOUNTER — Ambulatory Visit: Payer: Self-pay

## 2021-03-11 NOTE — Telephone Encounter (Signed)
Rash is located arms, back, Dry skin patches- that are flaky and hyperpigmented - Pt thinks that the olmesartan-HCTZ is causing the rash. Pt stated that she had the same reaction to lisinopril.  Pt has had wife apply Neosporin to area between shoulder blades with some relief. Rash started 2 weeks ago. First noted on arms. olmesartan HCTZ was ordered on 12/24/20 No fever. No open skin. No joint pain, facial or mouth swelling.  Care advice given and pt verbalized understanding. Routing high priority. Pt needs advice whether to stop med. Pt takes med at night.  Unable to find appt with any provider.  Reason for Disposition . Hives or itching  Answer Assessment - Initial Assessment Questions 1. APPEARANCE of RASH: "Describe the rash." (e.g., spots, blisters, raised areas, skin peeling, scaly)     Dry patches scaly and can peel off 2. SIZE: "How big are the spots?" (e.g., tip of pen, eraser, coin; inches, centimeters)     Dimes sized 3. LOCATION: "Where is the rash located?"     Arms and back (at shoulder blades) 4. COLOR: "What color is the rash?" (Note: It is difficult to assess rash color in people with darker-colored skin. When this situation occurs, simply ask the caller to describe what they see.)     Darker than the rest of skin 5. ONSET: "When did the rash begin?"     2 weeks ago 6. FEVER: "Do you have a fever?" If Yes, ask: "What is your temperature, how was it measured, and when did it start?"     no 7. ITCHING: "Does the rash itch?" If Yes, ask: "How bad is the itch?" (Scale 1-10; or mild, moderate, severe)     Yes- moderate 8. CAUSE: "What do you think is causing the rash?"     olmesartan-HCTZ 9. NEW MEDICATION: "What new medication are you taking?" (e.g., name of antibiotic) "When did you start taking this medication?".     January 2022 10. OTHER SYMPTOMS: "Do you have any other symptoms?" (e.g., sore throat, fever, joint pain)       no 11. PREGNANCY: "Is there any chance you are  pregnant?" "When was your last menstrual period?"      Depo Provera/ spotting here and there  Protocols used: RASH - WIDESPREAD ON DRUGS-A-AH

## 2021-03-11 NOTE — Telephone Encounter (Signed)
Pt on wait list

## 2021-03-14 ENCOUNTER — Other Ambulatory Visit: Payer: Self-pay | Admitting: Family Medicine

## 2021-03-14 DIAGNOSIS — I10 Essential (primary) hypertension: Secondary | ICD-10-CM

## 2021-03-26 ENCOUNTER — Emergency Department: Payer: BLUE CROSS/BLUE SHIELD

## 2021-03-26 ENCOUNTER — Encounter: Payer: Self-pay | Admitting: Emergency Medicine

## 2021-03-26 ENCOUNTER — Emergency Department
Admission: EM | Admit: 2021-03-26 | Discharge: 2021-03-26 | Disposition: A | Discharge status: Home / Self Care | Payer: BLUE CROSS/BLUE SHIELD | Attending: Emergency Medicine | Admitting: Emergency Medicine

## 2021-03-26 DIAGNOSIS — R079 Chest pain, unspecified: Secondary | ICD-10-CM | POA: Diagnosis not present

## 2021-03-26 DIAGNOSIS — F17210 Nicotine dependence, cigarettes, uncomplicated: Secondary | ICD-10-CM | POA: Insufficient documentation

## 2021-03-26 DIAGNOSIS — R1012 Left upper quadrant pain: Secondary | ICD-10-CM | POA: Diagnosis not present

## 2021-03-26 DIAGNOSIS — R0981 Nasal congestion: Secondary | ICD-10-CM | POA: Insufficient documentation

## 2021-03-26 DIAGNOSIS — Z79899 Other long term (current) drug therapy: Secondary | ICD-10-CM | POA: Diagnosis not present

## 2021-03-26 DIAGNOSIS — E039 Hypothyroidism, unspecified: Secondary | ICD-10-CM | POA: Insufficient documentation

## 2021-03-26 DIAGNOSIS — R42 Dizziness and giddiness: Secondary | ICD-10-CM | POA: Diagnosis present

## 2021-03-26 DIAGNOSIS — R0602 Shortness of breath: Secondary | ICD-10-CM | POA: Diagnosis not present

## 2021-03-26 DIAGNOSIS — I10 Essential (primary) hypertension: Secondary | ICD-10-CM | POA: Diagnosis not present

## 2021-03-26 LAB — CBC WITH DIFFERENTIAL/PLATELET
Abs Immature Granulocytes: 0.01 10*3/uL (ref 0.00–0.07)
Basophils Absolute: 0 10*3/uL (ref 0.0–0.1)
Basophils Relative: 0 %
Eosinophils Absolute: 0.1 10*3/uL (ref 0.0–0.5)
Eosinophils Relative: 2 %
HCT: 33.7 % — ABNORMAL LOW (ref 36.0–46.0)
Hemoglobin: 10.4 g/dL — ABNORMAL LOW (ref 12.0–15.0)
Immature Granulocytes: 0 %
Lymphocytes Relative: 42 %
Lymphs Abs: 3 10*3/uL (ref 0.7–4.0)
MCH: 21.2 pg — ABNORMAL LOW (ref 26.0–34.0)
MCHC: 30.9 g/dL (ref 30.0–36.0)
MCV: 68.8 fL — ABNORMAL LOW (ref 80.0–100.0)
Monocytes Absolute: 0.6 10*3/uL (ref 0.1–1.0)
Monocytes Relative: 9 %
Neutro Abs: 3.4 10*3/uL (ref 1.7–7.7)
Neutrophils Relative %: 47 %
Platelets: 385 10*3/uL (ref 150–400)
RBC: 4.9 MIL/uL (ref 3.87–5.11)
RDW: 20.1 % — ABNORMAL HIGH (ref 11.5–15.5)
Smear Review: NORMAL
WBC: 7.2 10*3/uL (ref 4.0–10.5)
nRBC: 0 % (ref 0.0–0.2)

## 2021-03-26 LAB — HEPATIC FUNCTION PANEL
ALT: 18 U/L (ref 0–44)
AST: 22 U/L (ref 15–41)
Albumin: 3.8 g/dL (ref 3.5–5.0)
Alkaline Phosphatase: 55 U/L (ref 38–126)
Bilirubin, Direct: <0.1 mg/dL (ref 0.0–0.2)
Total Bilirubin: 0.5 mg/dL (ref 0.3–1.2)
Total Protein: 8.1 g/dL (ref 6.5–8.1)

## 2021-03-26 LAB — BASIC METABOLIC PANEL
Anion gap: 9 (ref 5–15)
BUN: 12 mg/dL (ref 6–20)
CO2: 20 mmol/L — ABNORMAL LOW (ref 22–32)
Calcium: 8.9 mg/dL (ref 8.9–10.3)
Chloride: 106 mmol/L (ref 98–111)
Creatinine, Ser: 0.84 mg/dL (ref 0.44–1.00)
GFR, Estimated: >60 mL/min (ref >60)
Glucose, Bld: 118 mg/dL — ABNORMAL HIGH (ref 70–99)
Potassium: 3.9 mmol/L (ref 3.5–5.1)
Sodium: 135 mmol/L (ref 135–145)

## 2021-03-26 LAB — URINALYSIS, COMPLETE (UACMP) WITH MICROSCOPIC
Bacteria, UA: NONE SEEN
Bilirubin Urine: NEGATIVE
Glucose, UA: NEGATIVE mg/dL
Ketones, ur: NEGATIVE mg/dL
Leukocytes,Ua: NEGATIVE
Nitrite: NEGATIVE
Protein, ur: NEGATIVE mg/dL
Specific Gravity, Urine: 1.004 — ABNORMAL LOW (ref 1.005–1.030)
pH: 5 (ref 5.0–8.0)

## 2021-03-26 LAB — TROPONIN I (HIGH SENSITIVITY)
Troponin I (High Sensitivity): 3 ng/L (ref <18)
Troponin I (High Sensitivity): 2 ng/L (ref <18)

## 2021-03-26 LAB — TSH: TSH: 3.922 u[IU]/mL (ref 0.350–4.500)

## 2021-03-26 LAB — LIPASE, BLOOD: Lipase: 33 U/L (ref 11–51)

## 2021-03-26 LAB — POC URINE PREG, ED: Preg Test, Ur: NEGATIVE

## 2021-03-26 LAB — T4, FREE: Free T4: 0.93 ng/dL (ref 0.61–1.12)

## 2021-03-26 LAB — D-DIMER, QUANTITATIVE: D-Dimer, Quant: 0.43 ug/mL-FEU (ref 0.00–0.50)

## 2021-03-26 MED ORDER — SODIUM CHLORIDE 0.9 % IV BOLUS
1000.0000 mL | Freq: Once | INTRAVENOUS | Status: AC
  Administered 2021-03-26: 1000 mL via INTRAVENOUS

## 2021-03-26 MED ORDER — MECLIZINE HCL 25 MG PO TABS: 25.0000 mg | ORAL_TABLET | Freq: Three times a day (TID) | ORAL | 0 refills | Status: DC | PRN

## 2021-03-26 MED ORDER — ACETAMINOPHEN 500 MG PO TABS
1000.0000 mg | ORAL_TABLET | Freq: Once | ORAL | Status: AC
  Administered 2021-03-26: 1000 mg via ORAL
  Filled 2021-03-26: qty 2

## 2021-03-26 NOTE — Discharge Instructions (Addendum)
Take Flonase to help with nasal congestion and Zyrtec during the day to help with congestion.  You get these over-the-counter.  Use meclizine to help with the dizziness and vertigo symptoms.  Continue to drink plenty of fluids.  Return to ER if your dizziness is worsening or you develop any other new symptoms with it.

## 2021-03-26 NOTE — ED Triage Notes (Signed)
Pt c/o central substernal chest pain that radiates across the left side of chest that started after awakening this AM. Pt also c/o intermittent episodes of dizziness. Denies SOB, N/V.

## 2021-03-26 NOTE — ED Provider Notes (Signed)
[facility] Emergency Department Provider Note  ____________________________________________   Event Date/Time   First MD Initiated Contact with Patient 03/26/21 [phone]     (approximate)  I have reviewed the triage vital signs and the nursing notes.   HISTORY  Chief Complaint Chest Pain    HPI Lynn Wilson is a 40 y.o. female with GERD, hypertension, hypothyroid who comes in for chest pain.  Patient reports that she woke up this morning around 5 AM.  When she initially woke up she felt fine laying in bed but when she tried to stand up she felt very dizzy.  She decided that she would go to work and while at work she continued to feel dizzy with ambulation.  Her symptoms of dizziness went away when she was sitting down or laying down.  However at work she started develop chest pain and shortness of breath.  The chest pain felt like a pressure along her chest and then occasionally has some sharp stabbing sensations, constant, nothing made it better, nothing made it worse.  She did have some associated shortness of breath with that as well.  Now that patient is laying in bed she denies any dizziness but states that she continues to have the chest discomfort and a little bit of pain in her left upper quadrant.  She denies any unilateral leg swelling but she is on the Depo shot.  Denies any other risk factors for PE.  She states that she has been eating and drinking well.  Denies any headaches.  Does report an episode of diarrhea this morning and feels like she needs to have another episode right now.  Reports having some allergies recently with some nasal congestion.       Past Medical History:  Diagnosis Date  . GERD (gastroesophageal reflux disease)   . Hypertension   . Hypothyroid   . Hypothyroid 01/25/2018  . Iron deficiency anemia due to chronic blood loss 02/16/2018    Patient Active Problem List   Diagnosis Date Noted  . Elevated BP without diagnosis  of hypertension 08/27/2020  . Chronic left shoulder pain 02/22/2019  . Chronic gastritis without bleeding 02/22/2019  . Menorrhagia with regular cycle 05/10/2018  . Internal bleeding hemorrhoids 05/10/2018  . Migraine without aura and responsive to treatment 02/16/2018  . Iron deficiency anemia due to chronic blood loss 02/16/2018  . Hyperglycemia 01/29/2018  . Metabolic syndrome 82/99/3716  . Hypertension 01/25/2018  . GERD (gastroesophageal reflux disease) 01/25/2018  . Hypothyroid 01/25/2018  . Morbid obesity ([place]) 01/25/2018    Past Surgical History:  Procedure Laterality Date  . COLONOSCOPY WITH PROPOFOL N/A 09/08/2018   Procedure: COLONOSCOPY WITH PROPOFOL;  Surgeon: Lin Landsman, MD;  Location: [facility] ENDOSCOPY;  Service: Gastroenterology;  Laterality: N/A;  . ESOPHAGOGASTRODUODENOSCOPY (EGD) WITH PROPOFOL N/A 09/08/2018   Procedure: ESOPHAGOGASTRODUODENOSCOPY (EGD) WITH PROPOFOL;  Surgeon: Lin Landsman, MD;  Location: [facility] ENDOSCOPY;  Service: Gastroenterology;  Laterality: N/A;    Prior to Admission medications   Medication Sig Start Date End Date Taking? Authorizing Provider  amLODipine (NORVASC) 5 MG tablet Take 1 tablet (5 mg total) by mouth daily. 12/24/20   Steele Sizer, MD  cyclobenzaprine (FLEXERIL) 10 MG tablet Take 1 tablet (10 mg total) by mouth 3 (three) times daily as needed for muscle spasms. May make drowsy, take predominantly at bedtime, not drive after taking. 10/31/20   Triplett, Johnette Abraham B, FNP  dicyclomine (BENTYL) 10 MG capsule Take 1 capsule (10 mg total)  by mouth 3 (three) times daily as needed for up to 14 days (abdominal pain). 02/16/21 03/02/21  Nance Pear, MD  dicyclomine (BENTYL) 20 MG tablet Take 1 tablet (20 mg total) by mouth 4 (four) times daily -  before meals and at bedtime. 12/24/20   Steele Sizer, MD  ibuprofen (ADVIL) 800 MG tablet Take 1 tablet (800 mg total) by mouth every 8 (eight) hours as needed. 03/10/21   Menshew, Dannielle Karvonen, PA-C  levothyroxine (SYNTHROID) 100 MCG tablet Take 1 tablet (100 mcg total) by mouth every morning. 12/24/20   Steele Sizer, MD  medroxyPROGESTERone (DEPO-PROVERA) 150 MG/ML injection Inject 1 mL (150 mg total) into the muscle every 3 (three) months. 12/24/20   Steele Sizer, MD  olmesartan-hydrochlorothiazide (BENICAR HCT) 20-12.5 MG tablet Take 1 tablet by mouth once daily 03/14/21   Steele Sizer, MD  ondansetron (ZOFRAN) 4 MG tablet Take 1 tablet (4 mg total) by mouth every 8 (eight) hours as needed. 02/16/21   Nance Pear, MD  pantoprazole (PROTONIX) 40 MG tablet Take 1 tablet (40 mg total) by mouth 2 (two) times daily before a meal. 12/24/20   Ancil Boozer, Drue Stager, MD  Cetirizine HCl 10 MG CAPS Take 1 capsule (10 mg total) by mouth daily. 03/27/18 06/09/20  Sherrie George B, FNP    Allergies Celebrex [celecoxib], Sesame seed (diagnostic), and Lisinopril  Family History  Problem Relation Age of Onset  . Hypertension Mother   . Hypertension Father   . Stroke Father     Social History Social History   Tobacco Use  . Smoking status: Current Every Day Smoker    Packs/day: 0.75    Years: 20.00    Pack years: 15.00    Types: Cigarettes    Start date: 01/26/2000  . Smokeless tobacco: Never Used  Vaping Use  . Vaping Use: Never used  Substance Use Topics  . Alcohol use: Yes    Alcohol/week: 2.0 standard drinks    Types: 2 Cans of beer per week  . Drug use: No      Review of Systems Constitutional: No fever/chills, positive dizziness Eyes: No visual changes. ENT: No sore throat.  Positive nasal congestion Cardiovascular: Positive chest pain Respiratory: Positive shortness of breath Gastrointestinal: No abdominal pain.  No nausea, no vomiting.  No diarrhea.  No constipation. Genitourinary: Negative for dysuria. Musculoskeletal: Negative for back pain. Skin: Negative for rash. Neurological: Negative for headaches, focal weakness or numbness. All other ROS  negative ____________________________________________   PHYSICAL EXAM:  VITAL SIGNS: ED Triage Vitals [03/26/21 0640]  Enc Vitals Group     BP (!) 151/79     Pulse Rate 78     Resp 20     Temp 98.3 F (36.8 C)     Temp Source Oral     SpO2 99 %     Weight      Height      Head Circumference      Peak Flow      Pain Score      Pain Loc      Pain Edu?      Excl. in [place]?     Constitutional: Alert and oriented. Well appearing and in no acute distress. Eyes: Conjunctivae are normal. EOMI. TMs are clear bilaterally Head: Atraumatic. Nose: No congestion/rhinnorhea. Mouth/Throat: Mucous membranes are moist.   Neck: No stridor. Trachea Midline. FROM Cardiovascular: Normal rate, regular rhythm. Grossly normal heart sounds.  Good peripheral circulation. Respiratory: Normal respiratory effort.  No  retractions. Lungs CTAB. Gastrointestinal: Soft and nontender.  No rebound or guarding.  No distention. No abdominal bruits.  Musculoskeletal: No lower extremity tenderness nor edema.  No joint effusions. Neurologic:  Normal speech and language. No gross focal neurologic deficits are appreciated.  Finger-to-nose intact.  Cranial nerves II through XII are intact. Skin:  Skin is warm, dry and intact. No rash noted. Psychiatric: Mood and affect are normal. Speech and behavior are normal. GU: Deferred   ____________________________________________   LABS (all labs ordered are listed, but only abnormal results are displayed)  Labs Reviewed  CBC WITH DIFFERENTIAL/PLATELET - Abnormal; Notable for the following components:      Result Value   Hemoglobin 10.4 (*)    HCT 33.7 (*)    MCV 68.8 (*)    MCH 21.2 (*)    RDW 20.1 (*)    All other components within normal limits  BASIC METABOLIC PANEL - Abnormal; Notable for the following components:   CO2 20 (*)    Glucose, Bld 118 (*)    All other components within normal limits  URINALYSIS, COMPLETE (UACMP) WITH MICROSCOPIC - Abnormal;  Notable for the following components:   Color, Urine COLORLESS (*)    APPearance CLEAR (*)    Specific Gravity, Urine 1.004 (*)    Hgb urine dipstick MODERATE (*)    All other components within normal limits  HEPATIC FUNCTION PANEL  LIPASE, BLOOD  TSH  T4, FREE  D-DIMER, QUANTITATIVE  POC URINE PREG, ED  TROPONIN I (HIGH SENSITIVITY)  TROPONIN I (HIGH SENSITIVITY)   ____________________________________________   ED ECG REPORT I, Vanessa Mel[place], the attending physician, personally viewed and interpreted this ECG.  Sinus rate of 79, no ST elevation, no T wave inversions, normal intervals ____________________________________________  RADIOLOGY Robert Bellow, personally viewed and evaluated these images (plain radiographs) as part of my medical decision making, as well as reviewing the written report by the radiologist.  ED MD interpretation: No pneumonia  Official radiology report(s): DG Chest 2 View  Result Date: 03/26/2021 CLINICAL DATA:  40 year old female with substernal chest pain radiating to the left onset this morning. EXAM: CHEST - 2 VIEW COMPARISON:  Chest radiographs 04/19/2020 and earlier. FINDINGS: Mildly lower lung volumes. Normal cardiac size and mediastinal contours. Visualized tracheal air column is within normal limits. Both lungs are clear. No pneumothorax or pleural effusion. The visualized skeletal structures are unremarkable. Negative visible bowel gas. IMPRESSION: Negative.  No cardiopulmonary abnormality. Electronically Signed   By: Genevie Ann M.D.   On: 03/26/2021 07:17    ____________________________________________   PROCEDURES  Procedure(s) performed (including Critical Care):  .1-3 Lead EKG Interpretation Performed by: Vanessa [place]ton, MD Authorized by: Vanessa Ashaway, MD     Interpretation: normal     ECG rate:  70s   ECG rate assessment: normal     Rhythm: sinus rhythm     Ectopy: none     Conduction: normal        ____________________________________________   INITIAL IMPRESSION / ASSESSMENT AND PLAN / ED COURSE   Danijah L Chauncey was evaluated in Emergency Department on 03/26/2021 for the symptoms described in the history of present illness. She was evaluated in the context of the global COVID-19 pandemic, which necessitated consideration that the patient might be at risk for infection with the SARS-CoV-2 virus that causes COVID-19. Institutional protocols and algorithms that pertain to the evaluation of patients at risk for COVID-19 are in a state of rapid change  based on information released by regulatory bodies including the CDC and federal and state organizations. These policies and algorithms were followed during the patient's care in the ED.    Most Likely DDx:  -Patient comes in with chest pain, dizziness and some shortness of breath.  Unable to [facility] out due to patient being on Depo so we will get D-dimer.  Will get cardiac markers x2 to rule out ACS given onset of chest pain was at 6 AM, will give some fluids in case it could be a little dehydration and give some Tylenol to help with the pain.  We will check her thyroid levels given patient does report missing some doses.  We will keep patient on the cardiac monitor  DDx that was also considered d/t potential to cause harm, but was found less likely based on history and physical (as detailed above): -PNA (no fevers, cough but CXR to evaluate) -PNX (reassured with equal b/l breath sounds, CXR to evaluate) -Symptomatic anemia (will get H&H) -Aortic Dissection as no tearing pain and no radiation to the mid back, pulses equal -Pericarditis no rub on exam, EKG changes or hx to suggest dx -Tamponade (no notable SOB, tachycardic, hypotensive) -Esophageal rupture (no h/o diffuse vomitting/no crepitus)  Cardiac markers negative x2.  UA with some hemoglobin in it but patient states that she is currently menstruating.  No evidence of thyroid  dysfunction or PE with negative D-dimer.  Discussed with patient her low hemoglobin which is at baseline.  Patient states that she supposed to be on iron but has not been taking that.  Encourage patient to restart taking it.  On reevaluation patient still states she is a little bit of dizziness.  Again there is no dizziness at rest and her finger-to-nose is normal and she has no ataxia with walking therefore I have very low suspicion for posterior stroke.  I suspect that this is more likely some vertigo secondary to patient having a lot of congestion.  She denies any neck manipulation or recent neck trauma to suggest dissection.  Her TMs are clear bilaterally with no evidence of cerumen impaction.  We discussed symptom management with Zyrtec, meclizine, Flonase.  We discussed return precautions in regards to increasing dizziness at rest, weakness on one side, slurred speech or any other concerns.  Patient expressed understanding felt comfortable with being discharged home at this time.       ____________________________________________   FINAL CLINICAL IMPRESSION(S) / ED DIAGNOSES   Final diagnoses:  Vertigo  Nasal congestion     MEDICATIONS GIVEN DURING THIS VISIT:  Medications  sodium chloride 0.9 % bolus 1,000 mL (1,000 mLs Intravenous New Bag/Given 03/26/21 0733)  acetaminophen (TYLENOL) tablet 1,000 mg (1,000 mg Oral Given 03/26/21 0732)     ED Discharge Orders         Ordered    meclizine (ANTIVERT) 25 MG tablet  3 times daily PRN        03/26/21 8889           Note:  This document was prepared using Dragon voice recognition software and may include unintentional dictation errors.   Vanessa [place], MD 03/26/21 [phone]

## 2021-03-26 NOTE — ED Notes (Signed)
Provided specimen cup and instructions for urine specimen.

## 2021-04-07 ENCOUNTER — Ambulatory Visit: Payer: Self-pay | Admitting: *Deleted

## 2021-04-07 NOTE — Telephone Encounter (Signed)
Called patient to review concerns about continued vaginal bleeding x 2 weeks. Patient last received Depo injection in March. Light bleeding reported in March and now x 2 weeks patient has been bleeding small clots and constant bleeding . Pain in lower left of abdomen reported sharp pains. C/o dizziness and loss of balance and was seen in ED 2 weeks ago . Patient reports she is concerned due to continued vaginal bleeding and feels "gushing" at times. Earliest appt scheduled for 06/10/21. Care advise given. Patient verbalized understanding of care advise and to call back or go to [facility] or ED if symptoms worsen. Please advise .  Reason for Disposition . [1] Vaginal bleeding with > 6 soaked pads or tampons per day AND [2] lasts > 7 days  Answer Assessment - Initial Assessment Questions 1. TYPE: "What type of birth control shot are you getting?"  (e.g., Depo-Provera or Depo-subQ Provera 104 shot given every 3 months)     Depo 2. START DATE: "When did you first start getting the birth control shot? ? (e.g., date; weeks, months, years ago)  When was the last shot?"     Last shot was in March 2022 3. SYMPTOM: "What is the main symptom (or question) you're concerned about?"     bleeding x 2 weeks  4. LOCATION: "Where is pain located?" (e.g., abdomen, inside/outside, right/left)     Left side of lower abdomen 5. ONSET: "When did the pain and bleeding start?"     2 weeks ago  6. VAGINAL BLEEDING: "Are you having any unusual vaginal bleeding?"     - NONE     - SPOTTING: spotting or pinkish / brownish mucous discharge; does not fill panty-liner or pad     - MILD: less than 1 pad / hour; less than patient's usual menstrual bleeding     - MODERATE: 1-2 pads / hour; small-medium blood clots (e.g., pea, grape, small coin)     - SEVERE: soaking 2 or more pads/hour for 2 or more hours; bleeding not contained by pads or tampons     moderate 7. ABDOMEN OR PELVIC PAIN: "Are you have any pain in your abdomen or pelvic  area?" (Scale: 0, 1-10; none, mild, moderate, severe)   - NONE (0): no pain   - MILD (1-3): doesn't interfere with normal activities, abdomen soft and not tender to touch    - MODERATE (4-7): interferes with normal activities or awakens from sleep, tender to touch    - SEVERE (8-10): excruciating pain, doubled over, unable to do any normal activities      Mild  8. PREGNANCY: "Are you concerned you might be pregnant?" "When was your last menstrual period?"     Na  Protocols used: CONTRACEPTION - BIRTH CONTROL SHOT SYMPTOMS AND QUESTIONS-A-AH

## 2021-04-07 NOTE — Telephone Encounter (Signed)
Pt concerned as she had the depo shot in March and she is still bleeding. Pt requests call back to address her concerns  Called patient to review concerns. No answer, left message to call clinic back.

## 2021-04-08 NOTE — Telephone Encounter (Signed)
lvm for pt to call back to get earlier appt with Merleen Nicely Just FNP for this Friday or with Kathrine Haddock on 04/29/21

## 2021-04-11 ENCOUNTER — Ambulatory Visit (INDEPENDENT_AMBULATORY_CARE_PROVIDER_SITE_OTHER): Payer: BLUE CROSS/BLUE SHIELD | Admitting: Family Medicine

## 2021-04-11 ENCOUNTER — Encounter: Payer: Self-pay | Admitting: Family Medicine

## 2021-04-11 ENCOUNTER — Other Ambulatory Visit: Payer: Self-pay

## 2021-04-11 VITALS — BP 128/74 | HR 98 | Temp 97.8°F | Resp 18 | Ht 65.0 in | Wt 230.7 lb

## 2021-04-11 DIAGNOSIS — N920 Excessive and frequent menstruation with regular cycle: Secondary | ICD-10-CM

## 2021-04-11 LAB — CBC WITH DIFFERENTIAL/PLATELET
Absolute Monocytes: 503 cells/uL (ref 200–950)
Basophils Absolute: 7 cells/uL (ref 0–200)
Basophils Relative: 0.1 %
Eosinophils Absolute: 104 cells/uL (ref 15–500)
Eosinophils Relative: 1.4 %
HCT: 33.6 % — ABNORMAL LOW (ref 35.0–45.0)
Hemoglobin: 9.9 g/dL — ABNORMAL LOW (ref 11.7–15.5)
Lymphs Abs: 2982 cells/uL (ref 850–3900)
MCH: 21.2 pg — ABNORMAL LOW (ref 27.0–33.0)
MCHC: 29.5 g/dL — ABNORMAL LOW (ref 32.0–36.0)
MCV: 71.8 fL — ABNORMAL LOW (ref 80.0–100.0)
MPV: 9.7 fL (ref 7.5–12.5)
Monocytes Relative: 6.8 %
Neutro Abs: 3804 cells/uL (ref 1500–7800)
Neutrophils Relative %: 51.4 %
Platelets: 416 10*3/uL — ABNORMAL HIGH (ref 140–400)
RBC: 4.68 10*6/uL (ref 3.80–5.10)
RDW: 18.5 % — ABNORMAL HIGH (ref 11.0–15.0)
Total Lymphocyte: 40.3 %
WBC: 7.4 10*3/uL (ref 3.8–10.8)

## 2021-04-11 MED ORDER — NORGESTIMATE-ETH ESTRADIOL 0.25-35 MG-MCG PO TABS: 1.0000 | ORAL_TABLET | Freq: Every day | ORAL | 0 refills | Status: DC

## 2021-04-11 MED ORDER — IRON (FERROUS SULFATE) 325 (65 FE) MG PO TABS: 325.0000 mg | ORAL_TABLET | Freq: Every day | ORAL | 2 refills | Status: DC

## 2021-04-11 NOTE — Patient Instructions (Signed)
Menorrhagia Menorrhagia is when your monthly periods are heavy or last longer than normal. If you have this condition, bleeding and cramping may make it hard for you to do your daily activities. What are the causes? Common causes of this condition include:  Growths in the womb (uterus). These are polyps or fibroids. These growths are not cancer.  Problems with two hormones called estrogen and progesterone.  One of the ovaries not releasing an egg during one or more months.  A problem with the thyroid gland.  Having a device for birth control (IUD).  Side effects of some medicines, such as NSAIDs or blood thinners.  A disorder that stops the blood from clotting normally. What increases the risk? You are more likely to have this condition if you have cancer of the womb. What are the signs or symptoms?  Having to change your pad or tampon every 1-2 hours because it is soaked.  Needing to use pads and tampons at the same time because of heavy bleeding.  Needing to wake up to change your pads or tampons during the night.  Passing blood clots larger than 1 inch (2.5 cm) in size.  Having bleeding that lasts for more than 7 days.  Having symptoms of low iron levels (anemia), such as feeling tired or having shortness of breath. How is this treated? You may not need to be treated for this condition. But if you need treatment, you may be given medicines:  To reduce bleeding during your period. These include birth control medicines.  To make your blood thick. This slows bleeding.  To reduce swelling. Medicines that do this include ibuprofen.  That have a hormone called progestin.  That make the ovaries stop working for a short time.  To treat low iron levels. You will be given iron pills if you have this condition. If medicines do not work, surgery may be done. Surgery may be done to:  Remove a part of the lining of the womb. This lining is called the endometrium. This reduces  bleeding during a period.  Remove growths in the womb. These may be polyps or fibroids.  Remove the entire lining of the womb.  Remove the womb entirely. This procedure is called a hysterectomy.   Follow these instructions at home: Medicines  Take over-the-counter and prescription medicines only as told by your doctor. This includes iron pills.  Do not change or switch medicines without asking your doctor.  Do not take aspirin or medicines that contain aspirin 1 week before or during your period. Aspirin may make bleeding worse. Managing constipation Iron pills may cause trouble pooping (constipation). To prevent or treat problems when pooping, you may need to:  Drink enough fluid to keep your pee (urine) pale yellow.  Take over-the-counter or prescription medicines.  Eat foods that are high in fiber. These include beans, whole grains, and fresh fruits and vegetables.  Limit foods that are high in fat and sugar. These include fried or sweet foods. General instructions  If you need to change your pad or tampon more than once every 2 hours, limit your activity until the bleeding stops.  Eat healthy meals and foods that are high in iron. Foods that have a lot of iron include: ? Leafy green vegetables. ? Meat. ? Liver. ? Eggs. ? Whole-grain breads and cereals.  Do not try to lose weight until your heavy bleeding has stopped and you have normal amounts of iron in your blood. If you need to lose  weight, work with your doctor.  Keep all follow-up visits. Contact a doctor if:  You soak through a pad or tampon every 1 or 2 hours, and this happens every time you have a period.  You need to use pads and tampons at the same time because you are bleeding so much.  You are taking medicine, and: ? You feel like you may vomit. ? You vomit. ? You have watery poop (diarrhea).  You have other problems that may be related to the medicine you are taking. Get help right away if:  You  soak through more than a pad or tampon in 1 hour.  You pass clots bigger than 1 inch (2.5 cm) wide.  You feel short of breath.  You feel like your heart is beating too fast.  You feel dizzy or you faint.  You feel very weak or tired. Summary  Menorrhagia is when your menstrual periods are heavy or last longer than normal.  You may not need to be treated for this condition. If you need treatment, you may be given medicines or have surgery.  Take over-the-counter and prescription medicines only as told by your doctor. This includes iron pills.  Get help right away if you soak through more than a pad or tampon in 1 hour or you pass large clots. Also, get help right away if you feel dizzy, short of breath, or very weak or tired. This information is not intended to replace advice given to you by your health care provider. Make sure you discuss any questions you have with your health care provider. Document Revised: 08/13/2020 Document Reviewed: 08/13/2020 Elsevier Patient Education  2021 [employer].

## 2021-04-11 NOTE — Progress Notes (Signed)
4/29/20223:00 PM  Lynn Wilson 22-Sep-1981, 40 y.o., female [id number]  Chief Complaint  Patient presents with  . Vaginal Bleeding    Took her first Depo in March and still having vaginal bleeding    HPI:   Patient is a 40 y.o. female with past medical history significant for HTN, migraines, GERD who presents today for vaginal bleeding.  Started on Depo 02/03/21 Due again on May 21st Initially lightened period And then stopped Started again 2 weeks ago and hasn't stopped Prior to depo had heavy periods Lynn Wilson started Iron a couple of days ago  Has been seen in the ED multiple times since that visit 4/13, 3/28, and 3/6 Lab Results  Component Value Date   WBC 7.2 03/26/2021   HGB 10.4 (L) 03/26/2021   HCT 33.7 (L) 03/26/2021   MCV 68.8 (L) 03/26/2021   PLT 385 03/26/2021      Depression screen PHQ 2/9 04/11/2021 12/24/2020 08/27/2020  Decreased Interest 0 0 0  Down, Depressed, Hopeless 0 3 1  PHQ - 2 Score 0 3 1  Altered sleeping - 3 -  Tired, decreased energy - 3 -  Change in appetite - 3 -  Feeling bad or failure about yourself  - 0 -  Trouble concentrating - 0 -  Moving slowly or fidgety/restless - 0 -  Suicidal thoughts - 1 -  PHQ-9 Score - 13 -  Difficult doing work/chores - - -    Fall Risk  04/11/2021 12/24/2020 08/27/2020 08/25/2019 08/07/2019  Falls in the past year? 0 0 0 0 0  Number falls in past yr: 0 0 0 0 0  Injury with Fall? 0 0 0 0 0  Comment - - - - -  Follow up Falls evaluation completed - Falls evaluation completed - Falls evaluation completed     Allergies  Allergen Reactions  . Celebrex [Celecoxib]     "shakes/tremors"  . Sesame Seed (Diagnostic) Itching  . Lisinopril Rash    Prior to Admission medications   Medication Sig Start Date End Date Taking? Authorizing Provider  amLODipine (NORVASC) 5 MG tablet Take 1 tablet (5 mg total) by mouth daily. 12/24/20   Steele Sizer, MD  cyclobenzaprine (FLEXERIL) 10 MG tablet Take 1 tablet  (10 mg total) by mouth 3 (three) times daily as needed for muscle spasms. May make drowsy, take predominantly at bedtime, not drive after taking. 10/31/20   Triplett, Johnette Abraham B, FNP  dicyclomine (BENTYL) 10 MG capsule Take 1 capsule (10 mg total) by mouth 3 (three) times daily as needed for up to 14 days (abdominal pain). 02/16/21 03/02/21  Nance Pear, MD  dicyclomine (BENTYL) 20 MG tablet Take 1 tablet (20 mg total) by mouth 4 (four) times daily -  before meals and at bedtime. 12/24/20   Steele Sizer, MD  ibuprofen (ADVIL) 800 MG tablet Take 1 tablet (800 mg total) by mouth every 8 (eight) hours as needed. 03/10/21   Menshew, Dannielle Karvonen, PA-C  levothyroxine (SYNTHROID) 100 MCG tablet Take 1 tablet (100 mcg total) by mouth every morning. 12/24/20   Steele Sizer, MD  meclizine (ANTIVERT) 25 MG tablet Take 1 tablet (25 mg total) by mouth 3 (three) times daily as needed for dizziness. 03/26/21   Vanessa Nielsville, MD  medroxyPROGESTERone (DEPO-PROVERA) 150 MG/ML injection Inject 1 mL (150 mg total) into the muscle every 3 (three) months. 12/24/20   Steele Sizer, MD  olmesartan-hydrochlorothiazide (BENICAR HCT) 20-12.5 MG tablet Take 1 tablet by mouth  once daily 03/14/21   Steele Sizer, MD  ondansetron (ZOFRAN) 4 MG tablet Take 1 tablet (4 mg total) by mouth every 8 (eight) hours as needed. 02/16/21   Nance Pear, MD  pantoprazole (PROTONIX) 40 MG tablet Take 1 tablet (40 mg total) by mouth 2 (two) times daily before a meal. 12/24/20   Ancil Boozer, Drue Stager, MD  Cetirizine HCl 10 MG CAPS Take 1 capsule (10 mg total) by mouth daily. 03/27/18 06/09/20  Victorino Dike, FNP    Past Medical History:  Diagnosis Date  . GERD (gastroesophageal reflux disease)   . Hypertension   . Hypothyroid   . Hypothyroid 01/25/2018  . Iron deficiency anemia due to chronic blood loss 02/16/2018    Past Surgical History:  Procedure Laterality Date  . COLONOSCOPY WITH PROPOFOL N/A 09/08/2018   Procedure: COLONOSCOPY WITH  PROPOFOL;  Surgeon: Lin Landsman, MD;  Location: [facility] ENDOSCOPY;  Service: Gastroenterology;  Laterality: N/A;  . ESOPHAGOGASTRODUODENOSCOPY (EGD) WITH PROPOFOL N/A 09/08/2018   Procedure: ESOPHAGOGASTRODUODENOSCOPY (EGD) WITH PROPOFOL;  Surgeon: Lin Landsman, MD;  Location: [facility] ENDOSCOPY;  Service: Gastroenterology;  Laterality: N/A;    Social History   Tobacco Use  . Smoking status: Current Every Day Smoker    Packs/day: 0.75    Years: 20.00    Pack years: 15.00    Types: Cigarettes    Start date: 01/26/2000  . Smokeless tobacco: Never Used  Substance Use Topics  . Alcohol use: Yes    Alcohol/week: 2.0 standard drinks    Types: 2 Cans of beer per week    Family History  Problem Relation Age of Onset  . Hypertension Mother   . Hypertension Father   . Stroke Father     Review of Systems  Constitutional: Positive for malaise/fatigue.  Respiratory: Negative.   Cardiovascular: Negative.   Gastrointestinal: Negative.   Musculoskeletal: Negative.   Neurological: Negative.      OBJECTIVE:  Today's Vitals   04/11/21 1445  BP: 128/74  Pulse: 98  Resp: 18  Temp: 97.8 F (36.6 C)  TempSrc: Oral  SpO2: 99%  Weight: 230 lb 11.2 oz (104.6 kg)  Height: 5\' 5"  (1.651 m)   Body mass index is 38.39 kg/m.   Physical Exam Constitutional:      General: She is not in acute distress.    Appearance: Normal appearance. She is not ill-appearing.  HENT:     Head: Normocephalic.  Cardiovascular:     Rate and Rhythm: Normal rate and regular rhythm.     Pulses: Normal pulses.     Heart sounds: Normal heart sounds. No murmur heard. No friction rub. No gallop.   Pulmonary:     Effort: Pulmonary effort is normal. No respiratory distress.     Breath sounds: Normal breath sounds. No stridor. No wheezing, rhonchi or rales.  Abdominal:     General: Bowel sounds are normal.     Palpations: Abdomen is soft.     Tenderness: There is no abdominal tenderness.   Musculoskeletal:     Right lower leg: No edema.     Left lower leg: No edema.  Skin:    General: Skin is warm and dry.  Neurological:     Mental Status: She is alert and oriented to person, place, and time.  Psychiatric:        Mood and Affect: Mood normal.        Behavior: Behavior normal.     No results found for this or any previous  visit (from the past 24 hour(s)).  No results found.   ASSESSMENT and PLAN  Problem List Items Addressed This Visit      Other   Menorrhagia with regular cycle - Primary   Relevant Medications   norgestimate-ethinyl estradiol (ORTHO-CYCLEN) 0.25-35 MG-MCG tablet   Other Relevant Orders   CBC with Differential       Plan . Take OCP for 3 weeks, plan to stop when follow up and get next depo injection, r/se/b discussed . RTC/ED precautions discussed . Continue iron supplementation daily    Return in about 3 weeks (around 05/02/2021).    Huston Foley Corrina Steffensen, FNP-BC [facility]

## 2021-04-15 ENCOUNTER — Telehealth: Payer: Self-pay

## 2021-04-15 ENCOUNTER — Other Ambulatory Visit: Payer: Self-pay

## 2021-04-15 DIAGNOSIS — D649 Anemia, unspecified: Secondary | ICD-10-CM

## 2021-04-15 NOTE — Telephone Encounter (Signed)
Left message for patient to come in at her convenience for lab draw. Orders up front.

## 2021-04-15 NOTE — Progress Notes (Signed)
Labs added.

## 2021-04-19 LAB — IRON,TIBC AND FERRITIN PANEL
%SAT: 7 % (calc) — ABNORMAL LOW (ref 16–45)
Ferritin: 15 ng/mL — ABNORMAL LOW (ref 16–154)
Iron: 28 ug/dL — ABNORMAL LOW (ref 40–190)
TIBC: 410 mcg/dL (calc) (ref 250–450)

## 2021-05-02 ENCOUNTER — Ambulatory Visit: Payer: BLUE CROSS/BLUE SHIELD

## 2021-05-02 ENCOUNTER — Ambulatory Visit: Payer: BLUE CROSS/BLUE SHIELD | Admitting: Family Medicine

## 2021-05-05 ENCOUNTER — Encounter: Payer: Self-pay | Admitting: Oncology

## 2021-05-05 ENCOUNTER — Ambulatory Visit (INDEPENDENT_AMBULATORY_CARE_PROVIDER_SITE_OTHER): Payer: BLUE CROSS/BLUE SHIELD

## 2021-05-05 ENCOUNTER — Other Ambulatory Visit: Payer: Self-pay

## 2021-05-05 DIAGNOSIS — Z3042 Encounter for surveillance of injectable contraceptive: Secondary | ICD-10-CM

## 2021-05-05 MED ORDER — MEDROXYPROGESTERONE ACETATE 150 MG/ML IM SUSP
150.0000 mg | Freq: Once | INTRAMUSCULAR | Status: AC
  Administered 2021-05-05: 150 mg via INTRAMUSCULAR

## 2021-05-11 IMAGING — DX DG ELBOW COMPLETE 3+V*R*
4 series · 4 of 4 positions shown · non-contrast
Comparison: 11/15/2018.

CLINICAL DATA: Right arm pain.

EXAM:
RIGHT ELBOW - COMPLETE 3+ VIEW

[elbow ap]
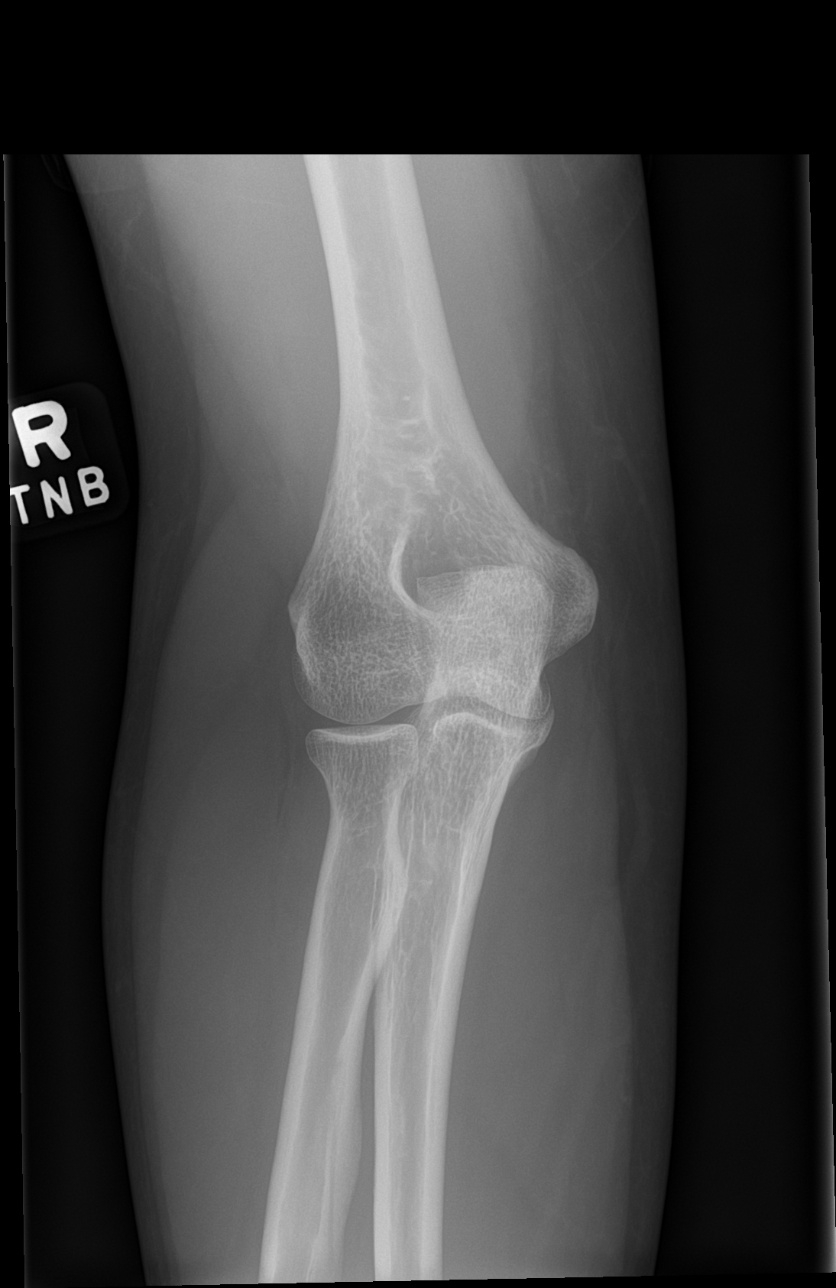

[elbow obl (1 of 2)]
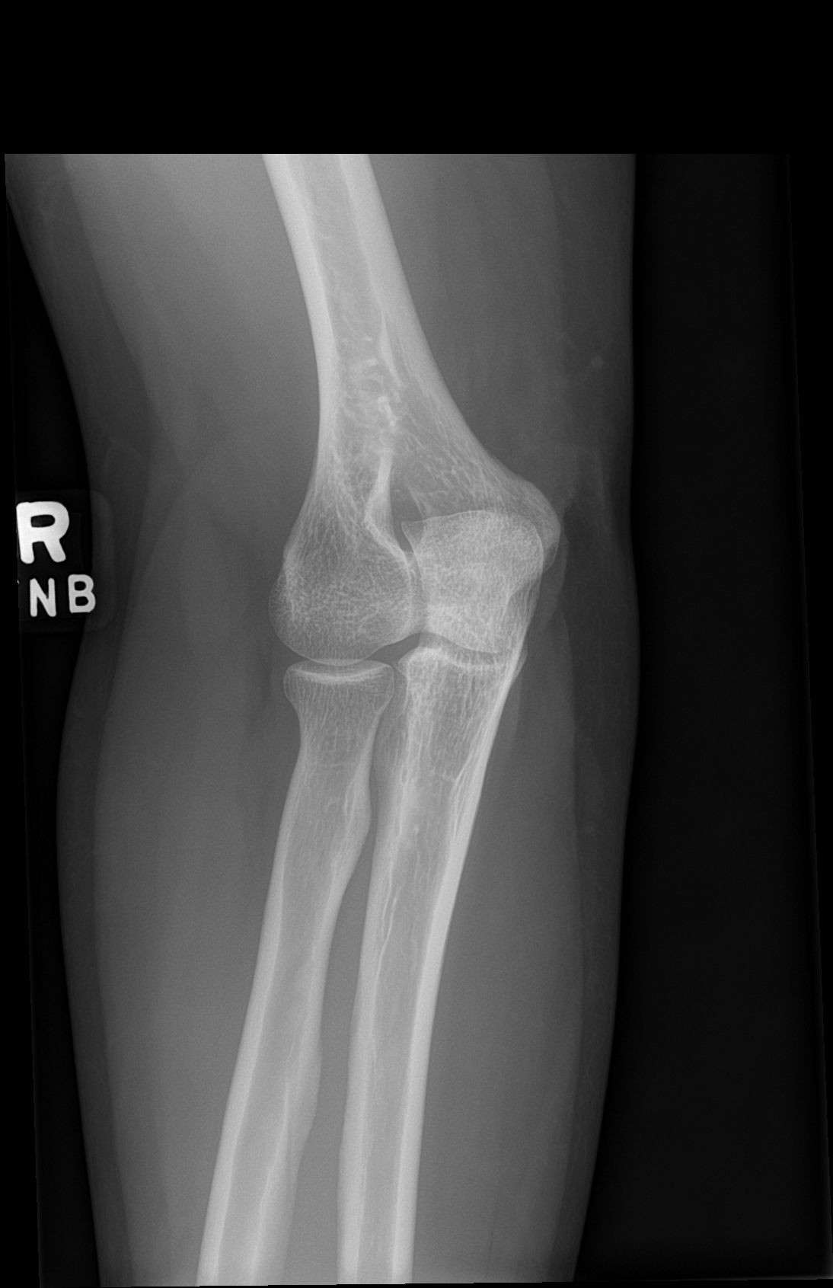

[elbow obl (2 of 2)]
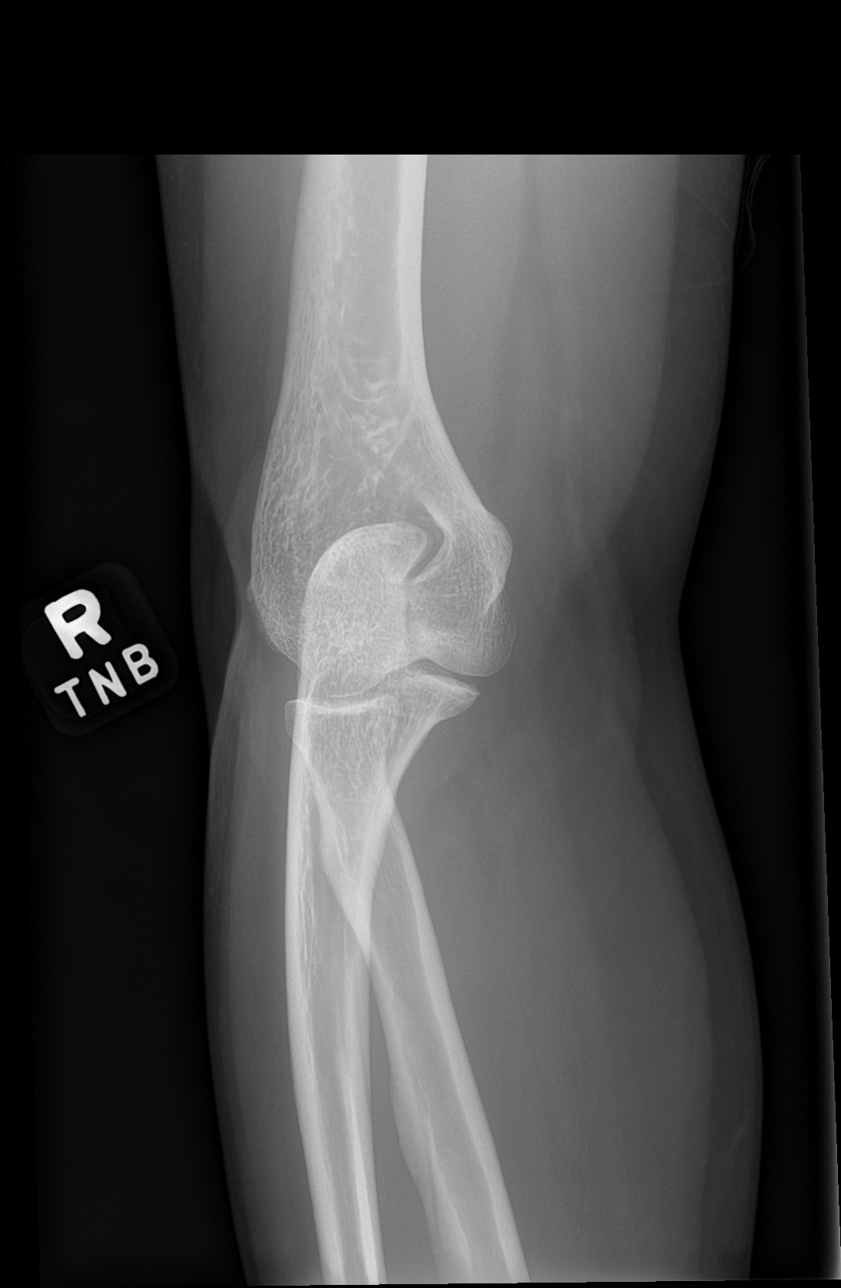

[elbow lat]
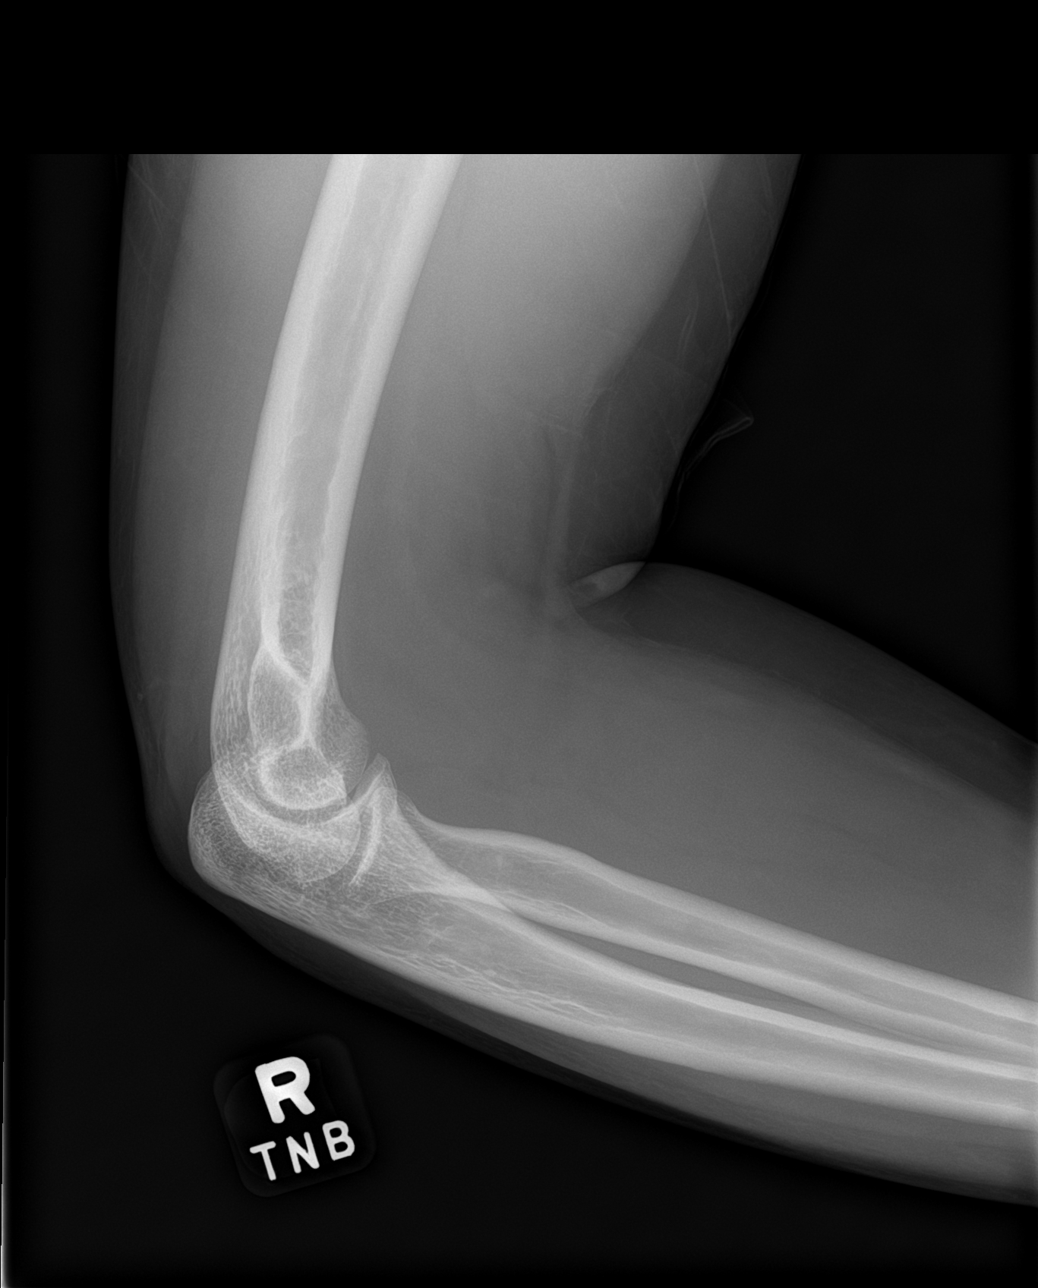

[4 of 4 positions shown; findings below may reference images not displayed]

FINDINGS: No acute bony or joint abnormality. No evidence of fracture or
dislocation. No evidence of effusion.
IMPRESSION: No acute abnormality.

## 2021-06-10 ENCOUNTER — Ambulatory Visit: Payer: Self-pay | Admitting: Family Medicine

## 2021-07-16 ENCOUNTER — Telehealth (INDEPENDENT_AMBULATORY_CARE_PROVIDER_SITE_OTHER): Payer: BLUE CROSS/BLUE SHIELD | Admitting: Family Medicine

## 2021-07-16 ENCOUNTER — Other Ambulatory Visit: Payer: Self-pay

## 2021-07-16 ENCOUNTER — Encounter: Payer: Self-pay | Admitting: Family Medicine

## 2021-07-16 ENCOUNTER — Encounter: Payer: Self-pay | Admitting: Oncology

## 2021-07-16 DIAGNOSIS — U071 COVID-19: Secondary | ICD-10-CM

## 2021-07-16 MED ORDER — NIRMATRELVIR/RITONAVIR (PAXLOVID)TABLET
3.0000 | ORAL_TABLET | Freq: Two times a day (BID) | ORAL | 0 refills | Status: AC
  Filled 2021-07-16: qty 30, 5d supply, fill #0

## 2021-07-16 NOTE — Patient Instructions (Signed)
It was great to see you!  Our plans for today:  - See below for self-isolation guidelines. You may end your quarantine once you are 5 days from symptom onset and fever free for 24 hours without use of tylenol or ibuprofen. Wear a well-fitting N95 mask for an additional 5 days. - Take the paxlovid as directed. See HotterNames.de for more information about the medication. - I recommend getting your booster vaccination once you are healed from your current infection. - Certainly, if you are having difficulties breathing or unable to keep down fluids, go to the Emergency Department.   Take care and seek immediate care sooner if you develop any concerns.   Dr. Ky Barban     Person Under Monitoring Name: Lynn Wilson  Location: [place] [address]   Infection Prevention Recommendations for Individuals Confirmed to have, or Being Evaluated for, 2019 Novel Coronavirus (COVID-19) Infection Who Receive Care at Home  Individuals who are confirmed to have, or are being evaluated for, COVID-19 should follow the prevention steps below until a healthcare provider or local or state health department says they can return to normal activities.  Stay home except to get medical care You should restrict activities outside your home, except for getting medical care. Do not go to work, school, or public areas, and do not use public transportation or taxis.  Call ahead before visiting your doctor Before your medical appointment, call the healthcare provider and tell them that you have, or are being evaluated for, COVID-19 infection. This will help the healthcare provider's office take steps to keep other people from getting infected. Ask your healthcare provider to call the local or state health department.  Monitor your symptoms Seek prompt medical attention if your illness is worsening (e.g., difficulty breathing). Before going to your  medical appointment, call the healthcare provider and tell them that you have, or are being evaluated for, COVID-19 infection. Ask your healthcare provider to call the local or state health department.  Wear a facemask You should wear a facemask that covers your nose and mouth when you are in the same room with other people and when you visit a healthcare provider. People who live with or visit you should also wear a facemask while they are in the same room with you.  Separate yourself from other people in your home As much as possible, you should stay in a different room from other people in your home. Also, you should use a separate bathroom, if available.  Avoid sharing household items You should not share dishes, drinking glasses, cups, eating utensils, towels, bedding, or other items with other people in your home. After using these items, you should wash them thoroughly with soap and water.  Cover your coughs and sneezes Cover your mouth and nose with a tissue when you cough or sneeze, or you can cough or sneeze into your sleeve. Throw used tissues in a lined trash can, and immediately wash your hands with soap and water for at least 20 seconds or use an alcohol-based hand rub.  Wash your [facility] your hands often and thoroughly with soap and water for at least 20 seconds. You can use an alcohol-based hand sanitizer if soap and water are not available and if your hands are not visibly dirty. Avoid touching your eyes, nose, and mouth with unwashed hands.   Prevention Steps for Caregivers and Household Members of Individuals Confirmed to have, or Being Evaluated for, COVID-19 Infection  Being Cared for in the Home  If you live with, or provide care at home for, a person confirmed to have, or being evaluated for, COVID-19 infection please follow these guidelines to prevent infection:  Follow healthcare provider's instructions Make sure that you understand and can help the  patient follow any healthcare provider instructions for all care.  Provide for the patient's basic needs You should help the patient with basic needs in the home and provide support for getting groceries, prescriptions, and other personal needs.  Monitor the patient's symptoms If they are getting sicker, call his or her medical provider and tell them that the patient has, or is being evaluated for, COVID-19 infection. This will help the healthcare provider's office take steps to keep other people from getting infected. Ask the healthcare provider to call the local or state health department.  Limit the number of people who have contact with the patient If possible, have only one caregiver for the patient. Other household members should stay in another home or place of residence. If this is not possible, they should stay in another room, or be separated from the patient as much as possible. Use a separate bathroom, if available. Restrict visitors who do not have an essential need to be in the home.  Keep older adults, very young children, and other sick people away from the patient Keep older adults, very young children, and those who have compromised immune systems or chronic health conditions away from the patient. This includes people with chronic heart, lung, or kidney conditions, diabetes, and cancer.  Ensure good ventilation Make sure that shared spaces in the home have good air flow, such as from an air conditioner or an opened window, weather permitting.  Wash your hands often Wash your hands often and thoroughly with soap and water for at least 20 seconds. You can use an alcohol based hand sanitizer if soap and water are not available and if your hands are not visibly dirty. Avoid touching your eyes, nose, and mouth with unwashed hands. Use disposable paper towels to dry your hands. If not available, use dedicated cloth towels and replace them when they become wet.  Wear a  facemask and gloves Wear a disposable facemask at all times in the room and gloves when you touch or have contact with the patient's blood, body fluids, and/or secretions or excretions, such as sweat, saliva, sputum, nasal mucus, vomit, urine, or feces.  Ensure the mask fits over your nose and mouth tightly, and do not touch it during use. Throw out disposable facemasks and gloves after using them. Do not reuse. Wash your hands immediately after removing your facemask and gloves. If your personal clothing becomes contaminated, carefully remove clothing and launder. Wash your hands after handling contaminated clothing. Place all used disposable facemasks, gloves, and other waste in a lined container before disposing them with other household waste. Remove gloves and wash your hands immediately after handling these items.  Do not share dishes, glasses, or other household items with the patient Avoid sharing household items. You should not share dishes, drinking glasses, cups, eating utensils, towels, bedding, or other items with a patient who is confirmed to have, or being evaluated for, COVID-19 infection. After the person uses these items, you should wash them thoroughly with soap and water.  Wash laundry thoroughly Immediately remove and wash clothes or bedding that have blood, body fluids, and/or secretions or excretions, such as sweat, saliva, sputum, nasal mucus, vomit, urine, or feces, on  them. Wear gloves when handling laundry from the patient. Read and follow directions on labels of laundry or clothing items and detergent. In general, wash and dry with the warmest temperatures recommended on the label.  Clean all areas the individual has used often Clean all touchable surfaces, such as counters, tabletops, doorknobs, bathroom fixtures, toilets, phones, keyboards, tablets, and bedside tables, every day. Also, clean any surfaces that may have blood, body fluids, and/or secretions or excretions  on them. Wear gloves when cleaning surfaces the patient has come in contact with. Use a diluted bleach solution (e.g., dilute bleach with 1 part bleach and 10 parts water) or a household disinfectant with a label that says EPA-registered for coronaviruses. To make a bleach solution at home, add 1 tablespoon of bleach to 1 quart (4 cups) of water. For a larger supply, add  cup of bleach to 1 gallon (16 cups) of water. Read labels of cleaning products and follow recommendations provided on product labels. Labels contain instructions for safe and effective use of the cleaning product including precautions you should take when applying the product, such as wearing gloves or eye protection and making sure you have good ventilation during use of the product. Remove gloves and wash hands immediately after cleaning.  Monitor yourself for signs and symptoms of illness Caregivers and household members are considered close contacts, should monitor their health, and will be asked to limit movement outside of the home to the extent possible. Follow the monitoring steps for close contacts listed on the symptom monitoring form.   ? If you have additional questions, contact your local health department or call the epidemiologist on call at [phone] (available 24/7). ? This guidance is subject to change. For the most up-to-date guidance from [facility], please refer to their website: [email or web address]

## 2021-07-16 NOTE — Progress Notes (Signed)
Virtual Visit Note  I connected with Batool L Cronkright on 07/16/21 at  3:40 PM EDT by phone and verified that I am speaking with the correct person using two identifiers.  Location: Patient: home Provider: [facility]   I discussed the limitations of evaluation and management by telemedicine and the availability of in person appointments. The patient expressed understanding and agreed to proceed.  History of Present Illness:  UPPER RESPIRATORY TRACT INFECTION - COVID + - symptom onset 8/1 - has received 2 doses of COVID vaccine  Worst symptom: Fever:  100F Tmax Cough: yes Shortness of breath: no Wheezing: no Chest pain: no Chest tightness: no Chest congestion: yes Nasal congestion: yes Runny nose: yes Sore throat: yes Ear pain: yes bilateral Ear pressure: yes bilateral Eyes red/itching:no Eye drainage/crusting: no  Vomiting: no Fatigue: yes Sick contacts: yes, wife with COVID Relief with OTC cold/cough medications: yes  Treatments attempted:  mucinex, ibuprofen    Observations/Objective:  Patient had trouble connecting to video visit, entirety of visit conducted over the phone.  Speaks in full sentences, no respiratory distress. Congested sounding.   Assessment and Plan:  [id number] Doing well with mild sx. Is a candidate for COVID tx, reviewed paxlovid EUA with patient and reviewed potential drug interactions. Patient amenable to treatment, rx sent to pharmacy. EUA handout provided.  Reviewed OTC symptom relief, self-quarantine guidelines, and emergency precautions.     I discussed the assessment and treatment plan with the patient. The patient was provided an opportunity to ask questions and all were answered. The patient agreed with the plan and demonstrated an understanding of the instructions.   The patient was advised to call back or seek an in-person evaluation if the symptoms worsen or if the condition fails to improve as anticipated.  I provided 9 minutes of  non-face-to-face time during this encounter.   Myles Gip, DO

## 2021-07-17 ENCOUNTER — Other Ambulatory Visit: Payer: Self-pay

## 2021-08-07 ENCOUNTER — Other Ambulatory Visit: Payer: Self-pay

## 2021-08-07 ENCOUNTER — Emergency Department (HOSPITAL_COMMUNITY)
Admission: EM | Admit: 2021-08-07 | Discharge: 2021-08-07 | Disposition: A | Discharge status: Home / Self Care | Payer: BLUE CROSS/BLUE SHIELD | Attending: Emergency Medicine | Admitting: Emergency Medicine

## 2021-08-07 ENCOUNTER — Ambulatory Visit: Payer: Self-pay | Admitting: *Deleted

## 2021-08-07 ENCOUNTER — Encounter (HOSPITAL_COMMUNITY): Payer: Self-pay | Admitting: *Deleted

## 2021-08-07 ENCOUNTER — Encounter: Payer: Self-pay | Admitting: Oncology

## 2021-08-07 ENCOUNTER — Other Ambulatory Visit: Payer: Self-pay | Admitting: Family Medicine

## 2021-08-07 DIAGNOSIS — N939 Abnormal uterine and vaginal bleeding, unspecified: Secondary | ICD-10-CM | POA: Insufficient documentation

## 2021-08-07 DIAGNOSIS — Z5321 Procedure and treatment not carried out due to patient leaving prior to being seen by health care provider: Secondary | ICD-10-CM | POA: Insufficient documentation

## 2021-08-07 DIAGNOSIS — N920 Excessive and frequent menstruation with regular cycle: Secondary | ICD-10-CM

## 2021-08-07 LAB — CBC WITH DIFFERENTIAL/PLATELET
Abs Immature Granulocytes: 0.02 10*3/uL (ref 0.00–0.07)
Basophils Absolute: 0 10*3/uL (ref 0.0–0.1)
Basophils Relative: 0 %
Eosinophils Absolute: 0.1 10*3/uL (ref 0.0–0.5)
Eosinophils Relative: 2 %
HCT: 30.9 % — ABNORMAL LOW (ref 36.0–46.0)
Hemoglobin: 9.8 g/dL — ABNORMAL LOW (ref 12.0–15.0)
Immature Granulocytes: 0 %
Lymphocytes Relative: 35 %
Lymphs Abs: 2.1 10*3/uL (ref 0.7–4.0)
MCH: 25.1 pg — ABNORMAL LOW (ref 26.0–34.0)
MCHC: 31.7 g/dL (ref 30.0–36.0)
MCV: 79 fL — ABNORMAL LOW (ref 80.0–100.0)
Monocytes Absolute: 0.3 10*3/uL (ref 0.1–1.0)
Monocytes Relative: 6 %
Neutro Abs: 3.4 10*3/uL (ref 1.7–7.7)
Neutrophils Relative %: 57 %
Platelets: 374 10*3/uL (ref 150–400)
RBC: 3.91 MIL/uL (ref 3.87–5.11)
RDW: 18.4 % — ABNORMAL HIGH (ref 11.5–15.5)
WBC: 6 10*3/uL (ref 4.0–10.5)
nRBC: 0 % (ref 0.0–0.2)

## 2021-08-07 NOTE — ED Triage Notes (Signed)
Pt with vaginal bleeding that is heavy and passing clots that started today.  Pt states she has never had bleeding like this before.  Pt denies any pregnancy.

## 2021-08-07 NOTE — Telephone Encounter (Signed)
Reason for Disposition  MODERATE vaginal bleeding (e.g., soaking 1 pad or tampon per hour and present > 6 hours; 1 menstrual cup every 6 hours)  Answer Assessment - Initial Assessment Questions 1. AMOUNT: "Describe the bleeding that you are having."    - SPOTTING: spotting, or pinkish / brownish mucous discharge; does not fill panty liner or pad    - MILD:  less than 1 pad / hour; less than patient's usual menstrual bleeding   - MODERATE: 1-2 pads / hour; 1 menstrual cup every 6 hours; small-medium blood clots (e.g., pea, grape, small coin)   - SEVERE: soaking 2 or more pads/hour for 2 or more hours; 1 menstrual cup every 2 hours; bleeding not contained by pads or continuous red blood from vagina; large blood clots (e.g., golf ball, large coin)      I'm having heavy vaginal bleeding last week.   My iron is low.   I'm taking iron pills.  This morning I started having clots coming out and I'm bleeding heavy.    I'm bleeding more than normal with my period.   I have fibroids.   I don't have a GYN. 2. ONSET: "When did the bleeding begin?" "Is it continuing now?"     I'm due a Depo shot towards the end of this month.   This is the first time I've bled this heavy.   I'm having a little cramps in abd.    I went to the ED but I left because the wait time was so long.   I've been bleeding heavy since last week.   It stopped at one point when I started my iron pills back.   Today I don't know why but I've started bleeding again today. 3. MENSTRUAL PERIOD: "When was the last normal menstrual period?" "How is this different than your period?"     My periods usual at the end of the month.    This is first time I've bled this heavy with clots.    I started Depo shot in May was last shot.   4. REGULARITY: "How regular are your periods?"     Usually at the end of every month. 5. ABDOMINAL PAIN: "Do you have any pain?" "How bad is the pain?"  (e.g., Scale 1-10; mild, moderate, or severe)   - MILD (1-3): doesn't  interfere with normal activities, abdomen soft and not tender to touch    - MODERATE (4-7): interferes with normal activities or awakens from sleep, abdomen tender to touch    - SEVERE (8-10): excruciating pain, doubled over, unable to do any normal activities      Mild cramping 6. PREGNANCY: "Could you be pregnant?" "Are you sexually active?" "Did you recently give birth?"     No not pregnant.  Not recently given birth 55. BREASTFEEDING: "Are you breastfeeding?"     No 8. HORMONES: "Are you taking any hormone medications, prescription or OTC?" (e.g., birth control pills, estrogen)     Depo shot  9. BLOOD THINNERS: "Do you take any blood thinners?" (e.g., Coumadin/warfarin, Pradaxa/dabigatran, aspirin)     No 10. CAUSE: "What do you think is causing the bleeding?" (e.g., recent gyn surgery, recent gyn procedure; known bleeding disorder, cervical cancer, polycystic ovarian disease, fibroids)         I was drinking beer last night, maybe that caused it.   I don't know.    11. HEMODYNAMIC STATUS: "Are you weak or feeling lightheaded?" If Yes, ask: "Can you stand  and walk normally?"        I was lightheaded earlier today but not now.   12. OTHER SYMPTOMS: "What other symptoms are you having with the bleeding?" (e.g., passed tissue, vaginal discharge, fever, menstrual-type cramps)       Passing clots and heavy bleeding.  Protocols used: Vaginal Bleeding - Abnormal-A-AH

## 2021-08-07 NOTE — Telephone Encounter (Signed)
Pt called in c/o heavy vaginal bleeding with clots.   It started last week and stopped when she started taking iron pills.   Today the vaginal bleeding restarted and she is passing a lot of clots too.   She is soaking a pad an hour and sometimes is changing the pad every 30 minutes.   When she is sitting still it's not so bad but "when I'm up walking around the bleeding is heavy and the clots come out".   She felt lightheaded this morning but doesn't now.  She went to the ED at [facility] but left because of the long wait.   Dr. Ancil Boozer doesn't have an appt until Monday.   I suggested the urgent care which she was agreeable to.   "There is an urgent care near me so I think I will go on there".   I let her know that was a good idea since she is bleeding that heavy.    I sent my notes to [facility] for Dr. Ancil Boozer' information.

## 2021-08-07 NOTE — Telephone Encounter (Signed)
Requested medication (s) are due for refill today - yes  Requested medication (s) are on the active medication list -yes  Future visit scheduled -no  Last refill: 05/01/21  Notes to clinic: Request RF: medication not assigned to protocol  Requested Prescriptions  Pending Prescriptions Disp Refills   medroxyPROGESTERone Acetate 150 MG/ML SUSY [Pharmacy Med Name: medroxyPROGESTERone Acetate 150 MG/ML Intramuscular Suspension Prefilled Syringe] 1 mL 0    Sig: INJECT 1 ML INTO THE MUSCLE EVERY 3 MONTHS     Off-Protocol Failed - 08/07/2021  3:55 PM      Failed - Medication not assigned to a protocol, review manually.      Passed - Valid encounter within last 12 months    Recent Outpatient Visits           3 weeks ago [place], DO   3 months ago Menorrhagia with regular cycle   [facility] Just, Laurita Quint, FNP   7 months ago Right elbow tendonitis   [facility] Steele Sizer, MD   11 months ago Essential hypertension   [facility] Towanda Malkin, MD   1 year ago Morbid obesity [facility]   [facility] Steele Sizer, MD                 Requested Prescriptions  Pending Prescriptions Disp Refills   medroxyPROGESTERone Acetate 150 MG/ML SUSY [Pharmacy Med Name: medroxyPROGESTERone Acetate 150 MG/ML Intramuscular Suspension Prefilled Syringe] 1 mL 0    Sig: INJECT 1 ML INTO THE MUSCLE EVERY 3 MONTHS     Off-Protocol Failed - 08/07/2021  3:55 PM      Failed - Medication not assigned to a protocol, review manually.      Passed - Valid encounter within last 12 months    Recent Outpatient Visits           3 weeks ago [place], DO   3 months ago Menorrhagia with regular cycle   [facility] Just, Laurita Quint, FNP   7 months ago Right elbow tendonitis   [facility] Steele Sizer, MD   11 months ago Essential hypertension   [facility] Towanda Malkin, MD   1 year ago Morbid obesity [facility])   [facility] Steele Sizer, MD

## 2021-08-08 ENCOUNTER — Other Ambulatory Visit: Payer: Self-pay

## 2021-08-11 ENCOUNTER — Other Ambulatory Visit: Payer: Self-pay

## 2021-08-12 NOTE — Telephone Encounter (Signed)
Lvm to sch appt per Dr Ancil Boozer request. If Dr Ancil Boozer is booked it is okay to sch with someone else (if possible a physical if not then a regular visit will work)

## 2021-09-11 NOTE — Progress Notes (Deleted)
Name: Lynn Wilson   MRN: [id number]    DOB: [birth year]   Date:09/11/2021       Progress Note  Subjective  Chief Complaint  Medication Refill  HPI  Left chronic  shoulder pain  - She states she has a long history of left shoulder pain,  and developed pain around 2020 , she saw Ortho, had CT shoulder that was negative for a tear, she is still having daily pain   Right elbow tendonitis: she went to [facility] 11/2020 because of worsening of pain on right elbow pain and diagnosed . She works as a Clinical research associate for [employer]. Pain on right elbow is constant, she states even gripping her phone causes severe pain, she is using left hand instead of using right arm. Worse with movement . Pain can go up to a 10 with certain movements. Wife is disabled now and she has to assist her and has aggravating symptoms   Migraine: she states only had a couple of episodes in 2020, she takes muscle relaxer prn for migraine    HTN: she is back on HCTZ 12.5 and norvasc 5 mg, bp has been elevated at [facility] and with Dr. Roxan Hockey last visit l. She denies palpitation or chest pain   Tobacco use: Smoking 1 pack daily. She has episodes of bronchitis , she denies cough, wheezing or SOB    Hypothyroidism: she is compliant with thyroid medication. No change in bowel movements she has chronic dry skin, she was going to a [facility] center but states not sure of the results of her last labs    Iron deficiency anemia: regular but heavy cycles even clots, used to get iron infusion, she is willing to try Depo now  She also has hemorrhoids without rectal bleeding, chronic gastritis per EGD, seen by Dr. Marius Ditch and is taking Protonix once daily now and symptoms are controlled. She is willing to try Depo    Pre-diabetes/Metabolic Syndrome: she has episode of polyphagia, but no  polydipsia or polyuria. Wife has uncontrolled DM, they are trying to follow a diabetic diet    Patient Active Problem List   Diagnosis Date Noted   Elevated BP  without diagnosis of hypertension 08/27/2020   Chronic left shoulder pain 02/22/2019   Chronic gastritis without bleeding 02/22/2019   Menorrhagia with regular cycle 05/10/2018   Internal bleeding hemorrhoids 05/10/2018   Migraine without aura and responsive to treatment 02/16/2018   Iron deficiency anemia due to chronic blood loss 02/16/2018   Hyperglycemia 02/40/9735   Metabolic syndrome 32/99/2426   Hypertension 01/25/2018   GERD (gastroesophageal reflux disease) 01/25/2018   Hypothyroid 01/25/2018   Morbid obesity ([place]) 01/25/2018    Past Surgical History:  Procedure Laterality Date   COLONOSCOPY WITH PROPOFOL N/A 09/08/2018   Procedure: COLONOSCOPY WITH PROPOFOL;  Surgeon: Lin Landsman, MD;  Location: [employer] [facility];  Service: Gastroenterology;  Laterality: N/A;   ESOPHAGOGASTRODUODENOSCOPY (EGD) WITH PROPOFOL N/A 09/08/2018   Procedure: ESOPHAGOGASTRODUODENOSCOPY (EGD) WITH PROPOFOL;  Surgeon: Lin Landsman, MD;  Location: [facility];  Service: Gastroenterology;  Laterality: N/A;    Family History  Problem Relation Age of Onset   Hypertension Mother    Hypertension Father    Stroke Father     Social History   Tobacco Use   Smoking status: Every Day    Packs/day: 0.75    Years: 20.00    Pack years: 15.00    Types: Cigarettes    Start date: 01/26/2000   Smokeless tobacco: Never  Substance Use Topics   Alcohol use: Yes    Alcohol/week: 2.0 standard drinks    Types: 2 Cans of beer per week     Current Outpatient Medications:    amLODipine (NORVASC) 5 MG tablet, Take 1 tablet (5 mg total) by mouth daily., Disp: 90 tablet, Rfl: 1   dicyclomine (BENTYL) 20 MG tablet, Take 1 tablet (20 mg total) by mouth 4 (four) times daily -  before meals and at bedtime., Disp: 40 tablet, Rfl: 0   ibuprofen (ADVIL) 800 MG tablet, Take 1 tablet (800 mg total) by mouth every 8 (eight) hours as needed., Disp: 30 tablet, Rfl: 0   Iron, Ferrous Sulfate, 325 (65 Fe) MG TABS,  Take 325 mg by mouth daily., Disp: 60 tablet, Rfl: 2   levothyroxine (SYNTHROID) 100 MCG tablet, Take 1 tablet (100 mcg total) by mouth every morning., Disp: 90 tablet, Rfl: 1   medroxyPROGESTERone (DEPO-PROVERA) 150 MG/ML injection, Inject 1 mL (150 mg total) into the muscle every 3 (three) months., Disp: 1 mL, Rfl: 1   olmesartan-hydrochlorothiazide (BENICAR HCT) 20-12.5 MG tablet, Take 1 tablet by mouth once daily, Disp: 90 tablet, Rfl: 0   pantoprazole (PROTONIX) 40 MG tablet, Take 1 tablet (40 mg total) by mouth 2 (two) times daily before a meal., Disp: 90 tablet, Rfl: 1  Allergies  Allergen Reactions   Celebrex [Celecoxib]     "shakes/tremors"   Sesame Seed (Diagnostic) Itching   Lisinopril Rash    I personally reviewed {Reviewed:14835} with the patient/caregiver today.   ROS  ***  Objective  There were no vitals filed for this visit.  There is no height or weight on file to calculate BMI.  Physical Exam ***  Recent Results (from the past 2160 hour(s))  CBC with Differential     Status: Abnormal   Collection Time: 08/07/21 12:55 PM  Result Value Ref Range   WBC 6.0 4.0 - 10.5 K/uL   RBC 3.91 3.87 - 5.11 MIL/uL   Hemoglobin 9.8 (L) 12.0 - 15.0 g/dL   HCT 30.9 (L) 36.0 - 46.0 %   MCV 79.0 (L) 80.0 - 100.0 fL   MCH 25.1 (L) 26.0 - 34.0 pg   MCHC 31.7 30.0 - 36.0 g/dL   RDW 18.4 (H) 11.5 - 15.5 %   Platelets 374 150 - 400 K/uL   nRBC 0.0 0.0 - 0.2 %   Neutrophils Relative % 57 %   Neutro Abs 3.4 1.7 - 7.7 K/uL   Lymphocytes Relative 35 %   Lymphs Abs 2.1 0.7 - 4.0 K/uL   Monocytes Relative 6 %   Monocytes Absolute 0.3 0.1 - 1.0 K/uL   Eosinophils Relative 2 %   Eosinophils Absolute 0.1 0.0 - 0.5 K/uL   Basophils Relative 0 %   Basophils Absolute 0.0 0.0 - 0.1 K/uL   Immature Granulocytes 0 %   Abs Immature Granulocytes 0.02 0.00 - 0.07 K/uL    Comment: Performed at [facility], [address], [place], [place] [address]    Diabetic Foot Exam: Diabetic  Foot Exam - Simple   No data filed    ***  PHQ2/9: Depression screen [facility] 2/9 07/16/2021 04/11/2021 12/24/2020 08/27/2020 08/27/2020  Decreased Interest 0 0 0 0 0  Down, Depressed, Hopeless 0 0 3 1 0  PHQ - 2 Score 0 0 3 1 0  Altered sleeping - - 3 - -  Tired, decreased energy - - 3 - -  Change in appetite - - 3 - -  Feeling bad or failure about yourself  - - 0 - -  Trouble concentrating - - 0 - -  Moving slowly or fidgety/restless - - 0 - -  Suicidal thoughts - - 1 - -  PHQ-9 Score - - 13 - -  Difficult doing work/chores - - - - -  Some recent data might be hidden    phq 9 is {gen pos FHL:456256} ***  Fall Risk: Fall Risk  07/16/2021 04/11/2021 12/24/2020 08/27/2020 08/25/2019  Falls in the past year? 0 0 0 0 0  Number falls in past yr: 0 0 0 0 0  Injury with Fall? 0 0 0 0 0  Comment - - - - -  Follow up Falls evaluation completed Falls evaluation completed - Falls evaluation completed -   ***   Functional Status Survey:   ***   Assessment & Plan  *** 1. Menorrhagia with regular cycle ***

## 2021-09-12 ENCOUNTER — Ambulatory Visit: Payer: BLUE CROSS/BLUE SHIELD | Admitting: Family Medicine

## 2021-09-26 ENCOUNTER — Other Ambulatory Visit: Payer: Self-pay

## 2021-09-26 ENCOUNTER — Encounter: Payer: Self-pay | Admitting: Nurse Practitioner

## 2021-09-26 ENCOUNTER — Ambulatory Visit (INDEPENDENT_AMBULATORY_CARE_PROVIDER_SITE_OTHER): Payer: BLUE CROSS/BLUE SHIELD | Admitting: Nurse Practitioner

## 2021-09-26 VITALS — BP 132/78 | HR 98 | Temp 98.2°F | Resp 18 | Ht 65.0 in | Wt 229.7 lb

## 2021-09-26 DIAGNOSIS — Z23 Encounter for immunization: Secondary | ICD-10-CM

## 2021-09-26 DIAGNOSIS — E039 Hypothyroidism, unspecified: Secondary | ICD-10-CM | POA: Diagnosis not present

## 2021-09-26 DIAGNOSIS — I10 Essential (primary) hypertension: Secondary | ICD-10-CM | POA: Diagnosis not present

## 2021-09-26 MED ORDER — LEVOTHYROXINE SODIUM 100 MCG PO TABS: 100.0000 ug | ORAL_TABLET | ORAL | 1 refills | Status: DC

## 2021-09-26 MED ORDER — HYDROCHLOROTHIAZIDE 25 MG PO TABS: 25.0000 mg | ORAL_TABLET | Freq: Every day | ORAL | 0 refills | Status: DC

## 2021-09-26 NOTE — Progress Notes (Signed)
BP 132/78   Pulse 98   Temp 98.2 F (36.8 C) (Oral)   Resp 18   Ht 5\' 5"  (1.651 m)   Wt 229 lb 11.2 oz (104.2 kg)   SpO2 99%   BMI 38.22 kg/m    Subjective:    Patient ID: Lynn Wilson, female    DOB: 1981-11-02, 40 y.o.   MRN: [id number]  HPI: Lynn Wilson is a 40 y.o. female, here for refill of medication.  HTN:  Current blood pressure is 132/78. She says her blood pressure has been around 130/80 at home. Currently taking olmesartan-hydrochlorothiazide.  She says since she switched to this medication she has had a rash across her face.  She says that when she stopped taking it the rash went away and then when she started the medication again the rash came back. She would like to take something else.  She says that she used to take HCTZ and that worked well for her.  She denies any chest pain or shortness of breath.    Hypothyroidism:  She is currently taking levothyroxine 100 mcg daily.  Her last TSH was within normal limits in April.  She denies any palpitations, heat or cold intolerance, changes in skin or hair.  Will recheck labs at next appointment.   Anemia:  She is currently taking Iron 325 mg daily.  Last H/H was 9.8 and 30.9 in 08/07/21. She denies any shortness of breath, fatigue  or chest pain.    Relevant past medical, surgical, family and social history reviewed and updated as indicated. Interim medical history since our last visit reviewed. Allergies and medications reviewed and updated.  Review of Systems  Constitutional: Negative for fever or weight change.  Respiratory: Negative for cough and shortness of breath.   Cardiovascular: Negative for chest pain or palpitations.  Gastrointestinal: Negative for abdominal pain, no bowel changes.  Musculoskeletal: Negative for gait problem or joint swelling.  Skin: Positive for rash.  Neurological: Negative for dizziness or headache.  No other specific complaints in a complete review of systems (except as  listed in HPI above).      Objective:    BP 132/78   Pulse 98   Temp 98.2 F (36.8 C) (Oral)   Resp 18   Ht 5\' 5"  (1.651 m)   Wt 229 lb 11.2 oz (104.2 kg)   SpO2 99%   BMI 38.22 kg/m   Wt Readings from Last 3 Encounters:  09/26/21 229 lb 11.2 oz (104.2 kg)  04/11/21 230 lb 11.2 oz (104.6 kg)  03/10/21 235 lb 14.3 oz (107 kg)    Physical Exam  Constitutional: Patient appears well-developed and well-nourished. Obese No distress.  HEENT: head atraumatic, normocephalic, pupils equal and reactive to light,  neck supple, throat within normal limits, normal rate, regular rhythm and normal heart sounds.  No murmur heard. No BLE edema. Pulmonary/Chest: Effort normal and breath sounds normal. No respiratory distress. Abdominal: Soft.  There is no tenderness. Psychiatric: Patient has a normal mood and affect. behavior is normal. Judgment and thought content normal.   Results for orders placed or performed in visit on 04/15/21  Fe+TIBC+Fer  Result Value Ref Range   Iron 28 (L) 40 - 190 mcg/dL   TIBC 410 250 - 450 mcg/dL (calc)   %SAT 7 (L) 16 - 45 % (calc)   Ferritin 15 (L) 16 - 154 ng/mL      Assessment & Plan:  1. Essential hypertension  - hydrochlorothiazide (HYDRODIURIL)  25 MG tablet; Take 1 tablet (25 mg total) by mouth daily.  Dispense: 30 tablet; Refill: 0  2. Hypothyroidism, adult  - levothyroxine (SYNTHROID) 100 MCG tablet; Take 1 tablet (100 mcg total) by mouth every morning.  Dispense: 90 tablet; Refill: 1  3. Need for influenza vaccination  - Flu Vaccine QUAD 6+ mos PF IM (Fluarix Quad PF)    Follow up plan: Return in about 4 weeks (around 10/24/2021) for follow up.

## 2021-10-15 ENCOUNTER — Ambulatory Visit: Payer: BLUE CROSS/BLUE SHIELD | Admitting: Family Medicine

## 2021-10-15 NOTE — Progress Notes (Deleted)
Name: Lynn Wilson   MRN: [id number]    DOB: 02-10-1981   Date:10/15/2021       Progress Note  Subjective  Chief Complaint  Follow up/medication Refill  HPI  Just saw Julie,NP 09/26/2021? Patient Active Problem List   Diagnosis Date Noted   Elevated BP without diagnosis of hypertension 08/27/2020   Chronic left shoulder pain 02/22/2019   Chronic gastritis without bleeding 02/22/2019   Menorrhagia with regular cycle 05/10/2018   Internal bleeding hemorrhoids 05/10/2018   Migraine without aura and responsive to treatment 02/16/2018   Iron deficiency anemia due to chronic blood loss 02/16/2018   Hyperglycemia 31/51/7616   Metabolic syndrome 07/37/1062   Hypertension 01/25/2018   GERD (gastroesophageal reflux disease) 01/25/2018   Hypothyroid 01/25/2018   Morbid obesity ([place]) 01/25/2018    Past Surgical History:  Procedure Laterality Date   COLONOSCOPY WITH PROPOFOL N/A 09/08/2018   Procedure: COLONOSCOPY WITH PROPOFOL;  Surgeon: Lin Landsman, MD;  Location: [facility];  Service: Gastroenterology;  Laterality: N/A;   ESOPHAGOGASTRODUODENOSCOPY (EGD) WITH PROPOFOL N/A 09/08/2018   Procedure: ESOPHAGOGASTRODUODENOSCOPY (EGD) WITH PROPOFOL;  Surgeon: Lin Landsman, MD;  Location: [facility];  Service: Gastroenterology;  Laterality: N/A;    Family History  Problem Relation Age of Onset   Hypertension Mother    Hypertension Father    Stroke Father     Social History   Tobacco Use   Smoking status: Every Day    Packs/day: 0.75    Years: 20.00    Pack years: 15.00    Types: Cigarettes    Start date: 01/26/2000   Smokeless tobacco: Never  Substance Use Topics   Alcohol use: Yes    Alcohol/week: 2.0 standard drinks    Types: 2 Cans of beer per week     Current Outpatient Medications:    dicyclomine (BENTYL) 20 MG tablet, Take 1 tablet (20 mg total) by mouth 4 (four) times daily -  before meals and at bedtime., Disp: 40 tablet, Rfl: 0    hydrochlorothiazide (HYDRODIURIL) 25 MG tablet, Take 1 tablet (25 mg total) by mouth daily., Disp: 30 tablet, Rfl: 0   ibuprofen (ADVIL) 800 MG tablet, Take 1 tablet (800 mg total) by mouth every 8 (eight) hours as needed., Disp: 30 tablet, Rfl: 0   Iron, Ferrous Sulfate, 325 (65 Fe) MG TABS, Take 325 mg by mouth daily., Disp: 60 tablet, Rfl: 2   levothyroxine (SYNTHROID) 100 MCG tablet, Take 1 tablet (100 mcg total) by mouth every morning., Disp: 90 tablet, Rfl: 1   pantoprazole (PROTONIX) 40 MG tablet, Take 1 tablet (40 mg total) by mouth 2 (two) times daily before a meal., Disp: 90 tablet, Rfl: 1  Allergies  Allergen Reactions   Celebrex [Celecoxib]     "shakes/tremors"   Sesame Seed (Diagnostic) Itching   Lisinopril Rash    I personally reviewed {Reviewed:14835} with the patient/caregiver today.   ROS  ***  Objective  There were no vitals filed for this visit.  There is no height or weight on file to calculate BMI.  Physical Exam ***  Recent Results (from the past 2160 hour(s))  CBC with Differential     Status: Abnormal   Collection Time: 08/07/21 12:55 PM  Result Value Ref Range   WBC 6.0 4.0 - 10.5 K/uL   RBC 3.91 3.87 - 5.11 MIL/uL   Hemoglobin 9.8 (L) 12.0 - 15.0 g/dL   HCT 30.9 (L) 36.0 - 46.0 %   MCV 79.0 (L) 80.0 -  100.0 fL   MCH 25.1 (L) 26.0 - 34.0 pg   MCHC 31.7 30.0 - 36.0 g/dL   RDW 18.4 (H) 11.5 - 15.5 %   Platelets 374 150 - 400 K/uL   nRBC 0.0 0.0 - 0.2 %   Neutrophils Relative % 57 %   Neutro Abs 3.4 1.7 - 7.7 K/uL   Lymphocytes Relative 35 %   Lymphs Abs 2.1 0.7 - 4.0 K/uL   Monocytes Relative 6 %   Monocytes Absolute 0.3 0.1 - 1.0 K/uL   Eosinophils Relative 2 %   Eosinophils Absolute 0.1 0.0 - 0.5 K/uL   Basophils Relative 0 %   Basophils Absolute 0.0 0.0 - 0.1 K/uL   Immature Granulocytes 0 %   Abs Immature Granulocytes 0.02 0.00 - 0.07 K/uL    Comment: Performed at [facility], [address], [place], [place] [address]    Diabetic  Foot Exam: Diabetic Foot Exam - Simple   No data filed    ***  PHQ2/9: Depression screen [facility] 2/9 09/26/2021 07/16/2021 04/11/2021 12/24/2020 08/27/2020  Decreased Interest 0 0 0 0 0  Down, Depressed, Hopeless 0 0 0 3 1  PHQ - 2 Score 0 0 0 3 1  Altered sleeping - - - 3 -  Tired, decreased energy - - - 3 -  Change in appetite - - - 3 -  Feeling bad or failure about yourself  - - - 0 -  Trouble concentrating - - - 0 -  Moving slowly or fidgety/restless - - - 0 -  Suicidal thoughts - - - 1 -  PHQ-9 Score - - - 13 -  Difficult doing work/chores - - - - -  Some recent data might be hidden    phq 9 is {gen pos [id number]} ***  Fall Risk: Fall Risk  09/26/2021 07/16/2021 04/11/2021 12/24/2020 08/27/2020  Falls in the past year? 0 0 0 0 0  Number falls in past yr: 0 0 0 0 0  Injury with Fall? 0 0 0 0 0  Comment - - - - -  Follow up Falls evaluation completed Falls evaluation completed Falls evaluation completed - Falls evaluation completed   ***   Functional Status Survey:   ***   Assessment & Plan  *** There are no diagnoses linked to this encounter.

## 2021-12-30 ENCOUNTER — Other Ambulatory Visit: Payer: Self-pay | Admitting: Nurse Practitioner

## 2021-12-30 DIAGNOSIS — I10 Essential (primary) hypertension: Secondary | ICD-10-CM

## 2021-12-31 MED ORDER — HYDROCHLOROTHIAZIDE 25 MG PO TABS: 25.0000 mg | ORAL_TABLET | Freq: Every day | ORAL | 0 refills | Status: DC

## 2022-01-13 ENCOUNTER — Encounter: Payer: Self-pay | Admitting: Oncology

## 2022-01-19 NOTE — Progress Notes (Signed)
Name: Lynn Wilson   MRN: [id number]    DOB: [birth year]   Date:01/20/2022       Progress Note  Subjective  Chief Complaint  Follow Up  HPI   Migraine: she states only had a couple of episodes in 2020, she takes muscle relaxer prn for migraine , she states if she avoids chocolate, a lot of sweets symptoms are controlled. She states baclofen works well for her. She asked to see neurologist since she has noticed a new type of headache, nuchal area, usually in the morning dull but can shoot down her spine and it causes her to stop what she is doing do to intensity. Not associated with weakness, nausea or vomiting. She is having pica , we will check labs. Since she also has a history of migraines we will refer her to neurologist    HTN: she is back on HCTZ 25 mg  and norvasc 5 mg, bp today is at goal. She has intermittent chest pain and has gone to [facility] in the past, usually triggered by stress. She was supposed to follow up with cardiologist, referred by [facility] in the past  Advised to correct iron and if symptoms than we can refer her to cardiologist    Smoker's cough : Smoking 1 pack daily. She has episodes of bronchitis , she has also noticed a cough that is productive in am's about three times a week, discussed importance of quitting. She is willing to try Wellbutrin    Hypothyroidism: she is compliant with thyroid medication. No change in bowel movements she has chronic dry skin, no dysphagia. We will recheck labs    Iron deficiency anemia: regular but heavy cycles even clots,  used to get iron infusion, we placed her on Depo in January 22 however she stopped last fall and started having cycles again two months ago and they are worse, heavier, still with clots but now has severe cramping.  She also has hemorrhoids without rectal bleeding, chronic gastritis per EGD, seen by Dr. Marius Ditch , she has been taking pantoprazole twice daily, but advised her to try going down to once a day She has noticed pica  lately   Pre-diabetes/Metabolic Syndrome: she has episodes of polyphagia, but denies polydipsia or polyuria   Dysmenorrhea secondary and past couple of months very heavy cycles with clots: she was on depo for a few months to control periods but she continue to have break through bleeding and she felt worse. She states she never had cramps like she is now    Morbid obesity: BMI above 35 with co-morbidities such as Pre-diabetes , HTN. Discussed importance of following a healthy diet   Patient Active Problem List   Diagnosis Date Noted   Chronic left shoulder pain 02/22/2019   Chronic gastritis without bleeding 02/22/2019   Menorrhagia with regular cycle 05/10/2018   Internal bleeding hemorrhoids 05/10/2018   Migraine without aura and responsive to treatment 02/16/2018   Iron deficiency anemia due to chronic blood loss 02/16/2018   Hyperglycemia 83/41/9622   Metabolic syndrome 29/79/8921   Hypertension 01/25/2018   GERD (gastroesophageal reflux disease) 01/25/2018   Hypothyroid 01/25/2018   Morbid obesity ([place]) 01/25/2018    Past Surgical History:  Procedure Laterality Date   COLONOSCOPY WITH PROPOFOL N/A 09/08/2018   Procedure: COLONOSCOPY WITH PROPOFOL;  Surgeon: Lin Landsman, MD;  Location: [place];  Service: Gastroenterology;  Laterality: N/A;   ESOPHAGOGASTRODUODENOSCOPY (EGD) WITH PROPOFOL N/A 09/08/2018   Procedure: ESOPHAGOGASTRODUODENOSCOPY (EGD) WITH PROPOFOL;  Surgeon: Lin Landsman, MD;  Location: [facility] ENDOSCOPY;  Service: Gastroenterology;  Laterality: N/A;    Family History  Problem Relation Age of Onset   Hypertension Mother    Hypertension Father    Stroke Father     Social History   Tobacco Use   Smoking status: Every Day    Packs/day: 0.75    Years: 20.00    Pack years: 15.00    Types: Cigarettes    Start date: 01/26/2000   Smokeless tobacco: Never  Substance Use Topics   Alcohol use: Yes    Alcohol/week: 2.0 standard drinks     Types: 2 Cans of beer per week     Current Outpatient Medications:    baclofen (LIORESAL) 10 MG tablet, Take 1 tablet (10 mg total) by mouth 3 (three) times daily. For headaches, Disp: 30 each, Rfl: 0   dicyclomine (BENTYL) 20 MG tablet, Take 1 tablet (20 mg total) by mouth 4 (four) times daily -  before meals and at bedtime., Disp: 40 tablet, Rfl: 0   levothyroxine (SYNTHROID) 100 MCG tablet, Take 1 tablet (100 mcg total) by mouth every morning., Disp: 90 tablet, Rfl: 1   amLODipine (NORVASC) 5 MG tablet, Take 1 tablet (5 mg total) by mouth daily., Disp: 90 tablet, Rfl: 0   hydrochlorothiazide (HYDRODIURIL) 25 MG tablet, Take 1 tablet (25 mg total) by mouth daily., Disp: 90 tablet, Rfl: 0   Iron, Ferrous Sulfate, 325 (65 Fe) MG TABS, Take 325 mg by mouth daily., Disp: 180 tablet, Rfl: 0   pantoprazole (PROTONIX) 40 MG tablet, Take 1 tablet (40 mg total) by mouth daily., Disp: 90 tablet, Rfl: 1  Allergies  Allergen Reactions   Celebrex [Celecoxib]     "shakes/tremors"   Sesame Seed (Diagnostic) Itching   Lisinopril Rash    I personally reviewed active problem list, medication list, allergies, family history, social history, health maintenance with the patient/caregiver today.   ROS  Constitutional: Negative for fever or weight change.  Respiratory: Negative for cough and shortness of breath.   Cardiovascular: Negative for chest pain or palpitations.  Gastrointestinal: Negative for abdominal pain, no bowel changes.  Musculoskeletal: Negative for gait problem or joint swelling.  Skin: Negative for rash.  Neurological: Negative for dizziness or headache.  No other specific complaints in a complete review of systems (except as listed in HPI above).   Objective  Vitals:   01/20/22 1401  BP: 130/84  Pulse: 91  Resp: 16  SpO2: 98%  Weight: 231 lb (104.8 kg)  Height: 5\' 4"  (1.626 m)    Body mass index is 39.65 kg/m.  Physical Exam  Constitutional: Patient appears  well-developed and well-nourished. Obese  No distress.  HEENT: head atraumatic, normocephalic, pupils equal and reactive to light, ears neck supple,  Cardiovascular: Normal rate, regular rhythm and normal heart sounds.  No murmur heard. No BLE edema. Pulmonary/Chest: Effort normal and breath sounds normal. No respiratory distress. Abdominal: Soft.  There is no tenderness. Psychiatric: Patient has a normal mood and affect. behavior is normal. Judgment and thought content normal.    PHQ2/9: Depression screen [facility] 2/9 01/20/2022 09/26/2021 07/16/2021 04/11/2021 12/24/2020  Decreased Interest 0 0 0 0 0  Down, Depressed, Hopeless 0 0 0 0 3  PHQ - 2 Score 0 0 0 0 3  Altered sleeping 0 - - - 3  Tired, decreased energy 3 - - - 3  Change in appetite 0 - - - 3  Feeling bad or failure  about yourself  0 - - - 0  Trouble concentrating 0 - - - 0  Moving slowly or fidgety/restless 0 - - - 0  Suicidal thoughts 0 - - - 1  PHQ-9 Score 3 - - - 13  Difficult doing work/chores - - - - -  Some recent data might be hidden    phq 9 is negative   Fall Risk: Fall Risk  01/20/2022 09/26/2021 07/16/2021 04/11/2021 12/24/2020  Falls in the past year? 0 0 0 0 0  Number falls in past yr: 0 0 0 0 0  Injury with Fall? 0 0 0 0 0  Comment - - - - -  Risk for fall due to : No Fall Risks - - - -  Follow up Falls prevention discussed Falls evaluation completed Falls evaluation completed Falls evaluation completed -      Functional Status Survey: Is the patient deaf or have difficulty hearing?: No Does the patient have difficulty seeing, even when wearing glasses/contacts?: No Does the patient have difficulty concentrating, remembering, or making decisions?: No Does the patient have difficulty walking or climbing stairs?: No Does the patient have difficulty dressing or bathing?: No Does the patient have difficulty doing errands alone such as visiting a doctor's office or shopping?: No    Assessment & Plan  1.  Essential hypertension  - COMPLETE METABOLIC PANEL WITH GFR - hydrochlorothiazide (HYDRODIURIL) 25 MG tablet; Take 1 tablet (25 mg total) by mouth daily.  Dispense: 90 tablet; Refill: 0 - amLODipine (NORVASC) 5 MG tablet; Take 1 tablet (5 mg total) by mouth daily.  Dispense: 90 tablet; Refill: 0  2. Hypothyroidism, adult  - TSH  3. Gastroesophageal reflux disease without esophagitis  - pantoprazole (PROTONIX) 40 MG tablet; Take 1 tablet (40 mg total) by mouth daily.  Dispense: 90 tablet; Refill: 1  4. Migraine without aura and with status migrainosus, not intractable   5. Pre-diabetes  - Hemoglobin A1c  6. Dyslipidemia  - Lipid panel  7. Metabolic syndrome   8. Menorrhagia with regular cycle  - US PELVIC COMPLETE WITH TRANSVAGINAL; Future - Iron, Ferrous Sulfate, 325 (65 Fe) MG TABS; Take 325 mg by mouth daily.  Dispense: 180 tablet; Refill: 0  9. Secondary dysmenorrhea  - US PELVIC COMPLETE WITH TRANSVAGINAL; Future  10. Iron deficiency anemia due to chronic blood loss  - CBC with Differential/Platelet - Iron, TIBC and Ferritin Panel - Ambulatory referral to Hematology / Oncology  11. Thrombocytosis   12. New onset headache  - Ambulatory referral to Neurology - baclofen (LIORESAL) 10 MG tablet; Take 1 tablet (10 mg total) by mouth 3 (three) times daily. For headaches  Dispense: 30 each; Refill: 0    13. Smokers' cough (HCC)  - buPROPion (WELLBUTRIN XL) 150 MG 24 hr tablet; Take 1 tablet (150 mg total) by mouth in the morning.  Dispense: 90 tablet; Refill: 0  14. Tobacco use  - buPROPion (WELLBUTRIN XL) 150 MG 24 hr tablet; Take 1 tablet (150 mg total) by mouth in the morning.  Dispense: 90 tablet; Refill: 0

## 2022-01-20 ENCOUNTER — Encounter: Payer: Self-pay | Admitting: Family Medicine

## 2022-01-20 ENCOUNTER — Ambulatory Visit (INDEPENDENT_AMBULATORY_CARE_PROVIDER_SITE_OTHER): Payer: PRIVATE HEALTH INSURANCE | Admitting: Family Medicine

## 2022-01-20 VITALS — BP 130/84 | HR 91 | Resp 16 | Ht 64.0 in | Wt 231.0 lb

## 2022-01-20 DIAGNOSIS — D75839 Thrombocytosis, unspecified: Secondary | ICD-10-CM

## 2022-01-20 DIAGNOSIS — Z720 Tobacco use: Secondary | ICD-10-CM

## 2022-01-20 DIAGNOSIS — E039 Hypothyroidism, unspecified: Secondary | ICD-10-CM

## 2022-01-20 DIAGNOSIS — N945 Secondary dysmenorrhea: Secondary | ICD-10-CM

## 2022-01-20 DIAGNOSIS — N920 Excessive and frequent menstruation with regular cycle: Secondary | ICD-10-CM

## 2022-01-20 DIAGNOSIS — I10 Essential (primary) hypertension: Secondary | ICD-10-CM | POA: Diagnosis not present

## 2022-01-20 DIAGNOSIS — R7303 Prediabetes: Secondary | ICD-10-CM

## 2022-01-20 DIAGNOSIS — D500 Iron deficiency anemia secondary to blood loss (chronic): Secondary | ICD-10-CM

## 2022-01-20 DIAGNOSIS — R519 Headache, unspecified: Secondary | ICD-10-CM

## 2022-01-20 DIAGNOSIS — E785 Hyperlipidemia, unspecified: Secondary | ICD-10-CM

## 2022-01-20 DIAGNOSIS — E8881 Metabolic syndrome: Secondary | ICD-10-CM

## 2022-01-20 DIAGNOSIS — K219 Gastro-esophageal reflux disease without esophagitis: Secondary | ICD-10-CM

## 2022-01-20 DIAGNOSIS — J410 Simple chronic bronchitis: Secondary | ICD-10-CM

## 2022-01-20 DIAGNOSIS — G43001 Migraine without aura, not intractable, with status migrainosus: Secondary | ICD-10-CM | POA: Diagnosis not present

## 2022-01-20 MED ORDER — HYDROCHLOROTHIAZIDE 25 MG PO TABS: 25.0000 mg | ORAL_TABLET | Freq: Every day | ORAL | 0 refills | Status: DC

## 2022-01-20 MED ORDER — PANTOPRAZOLE SODIUM 40 MG PO TBEC: 40.0000 mg | DELAYED_RELEASE_TABLET | Freq: Two times a day (BID) | ORAL | 1 refills | Status: DC

## 2022-01-20 MED ORDER — PANTOPRAZOLE SODIUM 40 MG PO TBEC: 40.0000 mg | DELAYED_RELEASE_TABLET | Freq: Every day | ORAL | 1 refills | Status: DC

## 2022-01-20 MED ORDER — BUPROPION HCL ER (XL) 150 MG PO TB24: 150.0000 mg | ORAL_TABLET | Freq: Every morning | ORAL | 0 refills | Status: DC

## 2022-01-20 MED ORDER — IRON (FERROUS SULFATE) 325 (65 FE) MG PO TABS: 325.0000 mg | ORAL_TABLET | Freq: Every day | ORAL | 0 refills | Status: DC

## 2022-01-20 MED ORDER — BACLOFEN 10 MG PO TABS: 10.0000 mg | ORAL_TABLET | Freq: Three times a day (TID) | ORAL | 0 refills | Status: DC

## 2022-01-20 MED ORDER — AMLODIPINE BESYLATE 5 MG PO TABS: 5.0000 mg | ORAL_TABLET | Freq: Every day | ORAL | 0 refills | Status: DC

## 2022-01-21 ENCOUNTER — Encounter: Payer: Self-pay | Admitting: Oncology

## 2022-01-21 LAB — TSH: TSH: 3.23 mIU/L

## 2022-01-21 LAB — IRON,TIBC AND FERRITIN PANEL
%SAT: 6 % (calc) — ABNORMAL LOW (ref 16–45)
Ferritin: 7 ng/mL — ABNORMAL LOW (ref 16–154)
Iron: 27 ug/dL — ABNORMAL LOW (ref 40–190)
TIBC: 451 mcg/dL (calc) — ABNORMAL HIGH (ref 250–450)

## 2022-01-21 LAB — COMPLETE METABOLIC PANEL WITH GFR
AG Ratio: 1.1 (calc) (ref 1.0–2.5)
ALT: 36 U/L — ABNORMAL HIGH (ref 6–29)
AST: 42 U/L — ABNORMAL HIGH (ref 10–30)
Albumin: 4.1 g/dL (ref 3.6–5.1)
Alkaline phosphatase (APISO): 93 U/L (ref 31–125)
BUN: 12 mg/dL (ref 7–25)
CO2: 27 mmol/L (ref 20–32)
Calcium: 9.2 mg/dL (ref 8.6–10.2)
Chloride: 102 mmol/L (ref 98–110)
Creat: 0.85 mg/dL (ref 0.50–0.97)
Globulin: 3.7 g/dL (calc) (ref 1.9–3.7)
Glucose, Bld: 113 mg/dL — ABNORMAL HIGH (ref 65–99)
Potassium: 3.8 mmol/L (ref 3.5–5.3)
Sodium: 137 mmol/L (ref 135–146)
Total Bilirubin: 0.3 mg/dL (ref 0.2–1.2)
Total Protein: 7.8 g/dL (ref 6.1–8.1)
eGFR: 90 mL/min/{1.73_m2} (ref >=60)

## 2022-01-21 LAB — LIPID PANEL
Cholesterol: 191 mg/dL (ref <200)
HDL: 54 mg/dL (ref >=50)
LDL Cholesterol (Calc): 113 mg/dL (calc) — ABNORMAL HIGH
Non-HDL Cholesterol (Calc): 137 mg/dL (calc) — ABNORMAL HIGH (ref <130)
Total CHOL/HDL Ratio: 3.5 (calc) (ref <5.0)
Triglycerides: 127 mg/dL (ref <150)

## 2022-01-21 LAB — CBC WITH DIFFERENTIAL/PLATELET
Absolute Monocytes: 421 cells/uL (ref 200–950)
Basophils Absolute: 7 cells/uL (ref 0–200)
Basophils Relative: 0.1 %
Eosinophils Absolute: 110 cells/uL (ref 15–500)
Eosinophils Relative: 1.6 %
HCT: 35.8 % (ref 35.0–45.0)
Hemoglobin: 11.1 g/dL — ABNORMAL LOW (ref 11.7–15.5)
Lymphs Abs: 2443 cells/uL (ref 850–3900)
MCH: 24 pg — ABNORMAL LOW (ref 27.0–33.0)
MCHC: 31 g/dL — ABNORMAL LOW (ref 32.0–36.0)
MCV: 77.3 fL — ABNORMAL LOW (ref 80.0–100.0)
MPV: 10.1 fL (ref 7.5–12.5)
Monocytes Relative: 6.1 %
Neutro Abs: 3919 cells/uL (ref 1500–7800)
Neutrophils Relative %: 56.8 %
Platelets: 423 10*3/uL — ABNORMAL HIGH (ref 140–400)
RBC: 4.63 10*6/uL (ref 3.80–5.10)
RDW: 15.9 % — ABNORMAL HIGH (ref 11.0–15.0)
Total Lymphocyte: 35.4 %
WBC: 6.9 10*3/uL (ref 3.8–10.8)

## 2022-01-21 LAB — HEMOGLOBIN A1C
Hgb A1c MFr Bld: 5.8 % of total Hgb — ABNORMAL HIGH (ref <5.7)
Mean Plasma Glucose: 120 mg/dL
eAG (mmol/L): 6.6 mmol/L

## 2022-01-23 ENCOUNTER — Encounter: Payer: Self-pay | Admitting: Oncology

## 2022-01-23 ENCOUNTER — Encounter: Payer: Self-pay | Admitting: *Deleted

## 2022-01-29 ENCOUNTER — Encounter: Payer: Self-pay | Admitting: Oncology

## 2022-01-30 ENCOUNTER — Inpatient Hospital Stay: Payer: 59

## 2022-01-30 ENCOUNTER — Inpatient Hospital Stay: Payer: 59 | Attending: Internal Medicine | Admitting: Internal Medicine

## 2022-01-30 ENCOUNTER — Encounter: Payer: Self-pay | Admitting: Internal Medicine

## 2022-01-30 ENCOUNTER — Ambulatory Visit
Admission: RE | Admit: 2022-01-30 | Discharge: 2022-01-30 | Disposition: A | Discharge status: Home / Self Care | Payer: 59 | Source: Ambulatory Visit | Attending: Family Medicine | Admitting: Family Medicine

## 2022-01-30 ENCOUNTER — Other Ambulatory Visit: Payer: Self-pay

## 2022-01-30 DIAGNOSIS — D509 Iron deficiency anemia, unspecified: Secondary | ICD-10-CM | POA: Insufficient documentation

## 2022-01-30 DIAGNOSIS — Z888 Allergy status to other drugs, medicaments and biological substances status: Secondary | ICD-10-CM | POA: Insufficient documentation

## 2022-01-30 DIAGNOSIS — N945 Secondary dysmenorrhea: Secondary | ICD-10-CM | POA: Diagnosis present

## 2022-01-30 DIAGNOSIS — Z886 Allergy status to analgesic agent status: Secondary | ICD-10-CM | POA: Insufficient documentation

## 2022-01-30 DIAGNOSIS — N939 Abnormal uterine and vaginal bleeding, unspecified: Secondary | ICD-10-CM | POA: Diagnosis not present

## 2022-01-30 DIAGNOSIS — Z8249 Family history of ischemic heart disease and other diseases of the circulatory system: Secondary | ICD-10-CM | POA: Diagnosis not present

## 2022-01-30 DIAGNOSIS — F17210 Nicotine dependence, cigarettes, uncomplicated: Secondary | ICD-10-CM | POA: Insufficient documentation

## 2022-01-30 DIAGNOSIS — R42 Dizziness and giddiness: Secondary | ICD-10-CM | POA: Insufficient documentation

## 2022-01-30 DIAGNOSIS — N920 Excessive and frequent menstruation with regular cycle: Secondary | ICD-10-CM | POA: Diagnosis present

## 2022-01-30 DIAGNOSIS — Z823 Family history of stroke: Secondary | ICD-10-CM | POA: Insufficient documentation

## 2022-01-30 DIAGNOSIS — F5089 Other specified eating disorder: Secondary | ICD-10-CM | POA: Insufficient documentation

## 2022-01-30 DIAGNOSIS — Z79899 Other long term (current) drug therapy: Secondary | ICD-10-CM | POA: Diagnosis not present

## 2022-01-30 DIAGNOSIS — R5383 Other fatigue: Secondary | ICD-10-CM | POA: Diagnosis not present

## 2022-01-30 DIAGNOSIS — D649 Anemia, unspecified: Secondary | ICD-10-CM

## 2022-01-30 NOTE — Assessment & Plan Note (Addendum)
#  Iron deficiency anemia-hemoglobin 11; ferritin 2-7 [PCP].  Poor tolerance to p.o. iron/constipation.  # Patient is symptomatic from worsening fatigue-recommend IV iron infusions. Discussed the potential acute infusion reactions with IV iron; which are quite rare.  Patient understands the risk; will proceed with infusions.  Patient had previous Venofer infusions without any side effects.  #Etiology:likley from menorrhagia; awaiting US/ no gyn- on depot [Dr.Sowles].  Await work-up at this time.   Thank you,Dr.Sowles for allowing me to participate in the care of your pleasant patient. Please do not hesitate to contact me with questions or concerns in the interim.  # DISPOSITION: # no labs today # venofer weekly x4- start next week # follow up in 3 months; MD: labs-cbc/bmp; possible venofer-Dr.B

## 2022-01-30 NOTE — Progress Notes (Signed)
[facility] NOTE  Patient Care Team: Steele Sizer, MD as PCP - General (Family Medicine) Cammie Sickle, MD as Consulting Physician (Hematology and Oncology) Vladimir Crofts, MD as Consulting Physician (Neurology)  CHIEF COMPLAINTS/PURPOSE OF CONSULTATION: ANEMIA   HEMATOLOGY HISTORY:  #Iron deficiency ANEMIA 2019 [Dr.Yu]- EGD/colonoscopy-  [Dr.vanga]; Hb-9-11    Latest Reference Range & Units 01/20/22 14:53  Iron 40 - 190 mcg/dL 27 (L)  TIBC 250 - 450 mcg/dL (calc) 451 (H)  %SAT 16 - 45 % (calc) 6 (L)  Ferritin 16 - 154 ng/mL 7 (L)  (L): Data is abnormally low (H): Data is abnormally high  HISTORY OF PRESENTING ILLNESS: Alone.  Ambulating independently. Lynn Wilson 41 y.o.  female has been referred to [place] for further evaluation/work-up for anemia.  Patient has prior history of iron deficient anemia s/p IV iron infusions in the past.  Patient has poor tolerance to p.o. iron.  Complains of worsening fatigue.  Complains of pica  Blood in stools: None; Change in bowel habits- None Blood in urine: None Difficulty swallowing: None Abnormal weight loss: None Iron supplementation:PO iron- dyspepsia.  Prior Blood transfusions: none Bariatric surgery: None  Vaginal bleeding: very heavy  Review of Systems  Constitutional:  Positive for malaise/fatigue. Negative for chills, diaphoresis, fever and weight loss.  HENT:  Negative for nosebleeds and sore throat.   Eyes:  Negative for double vision.  Respiratory:  Negative for cough, hemoptysis, sputum production, shortness of breath and wheezing.   Cardiovascular:  Negative for chest pain, palpitations, orthopnea and leg swelling.  Gastrointestinal:  Negative for abdominal pain, blood in stool, constipation, diarrhea, heartburn, melena, nausea and vomiting.  Genitourinary:  Negative for dysuria, frequency and urgency.  Musculoskeletal:  Negative for back pain and joint pain.  Skin: Negative.   Negative for itching and rash.  Neurological:  Positive for dizziness. Negative for tingling, focal weakness, weakness and headaches.  Endo/Heme/Allergies:  Does not bruise/bleed easily.  Psychiatric/Behavioral:  Negative for depression. The patient is not nervous/anxious and does not have insomnia.    MEDICAL HISTORY:  Past Medical History:  Diagnosis Date   GERD (gastroesophageal reflux disease)    Hypertension    Hypothyroid    Hypothyroid 01/25/2018   Iron deficiency anemia due to chronic blood loss 02/16/2018    SURGICAL HISTORY: Past Surgical History:  Procedure Laterality Date   COLONOSCOPY WITH PROPOFOL N/A 09/08/2018   Procedure: COLONOSCOPY WITH PROPOFOL;  Surgeon: Lin Landsman, MD;  Location: [facility] ENDOSCOPY;  Service: Gastroenterology;  Laterality: N/A;   ESOPHAGOGASTRODUODENOSCOPY (EGD) WITH PROPOFOL N/A 09/08/2018   Procedure: ESOPHAGOGASTRODUODENOSCOPY (EGD) WITH PROPOFOL;  Surgeon: Lin Landsman, MD;  Location: [facility] ENDOSCOPY;  Service: Gastroenterology;  Laterality: N/A;    SOCIAL HISTORY: Social History   Socioeconomic History   Marital status: Married    Spouse name: Curator   Number of children: 0   Years of education: Not on file   Highest education level: Associate degree: occupational, Hotel manager, or vocational program  Occupational History   Occupation: Education officer, museum: [id number]  Tobacco Use   Smoking status: Every Day    Packs/day: 0.75    Years: 20.00    Pack years: 15.00    Types: Cigarettes    Start date: 01/26/2000    Passive exposure: Never   Smokeless tobacco: Never  Vaping Use   Vaping Use: Never used  Substance and Sexual Activity   Alcohol use: Yes    Alcohol/week:  2.0 standard drinks    Types: 2 Cans of beer per week   Drug use: No   Sexual activity: Yes    Birth control/protection: Other-see comments    Comment: homosexual, injection due to heavy vag. bleeding  Other Topics Concern   Not on file  Social  History Narrative   She moved to [place] from [place], [place] in the Summer of 2018; She lives with her wife She is works at Librarian, academic, [employer]. wife had BKA of right leg in 2020   Social Determinants of Health   Financial Resource Strain: Not on file  Food Insecurity: Not on file  Transportation Needs: Not on file  Physical Activity: Not on file  Stress: Not on file  Social Connections: Not on file  Intimate Partner Violence: Not on file    FAMILY HISTORY: Family History  Problem Relation Age of Onset   Hypertension Mother    Hypertension Father    Stroke Father     ALLERGIES:  is allergic to celebrex [celecoxib], sesame seed (diagnostic), and lisinopril.  MEDICATIONS:  Current Outpatient Medications  Medication Sig Dispense Refill   amLODipine (NORVASC) 5 MG tablet Take 1 tablet (5 mg total) by mouth daily. 90 tablet 0   baclofen (LIORESAL) 10 MG tablet Take 1 tablet (10 mg total) by mouth 3 (three) times daily. For headaches 30 each 0   buPROPion (WELLBUTRIN XL) 150 MG 24 hr tablet Take 1 tablet (150 mg total) by mouth in the morning. 90 tablet 0   dicyclomine (BENTYL) 20 MG tablet Take 1 tablet (20 mg total) by mouth 4 (four) times daily -  before meals and at bedtime. 40 tablet 0   hydrochlorothiazide (HYDRODIURIL) 25 MG tablet Take 1 tablet (25 mg total) by mouth daily. 90 tablet 0   Iron, Ferrous Sulfate, 325 (65 Fe) MG TABS Take 325 mg by mouth daily. 180 tablet 0   levothyroxine (SYNTHROID) 100 MCG tablet Take 1 tablet (100 mcg total) by mouth every morning. 90 tablet 1   pantoprazole (PROTONIX) 40 MG tablet Take 1 tablet (40 mg total) by mouth daily. 90 tablet 1   No current facility-administered medications for this visit.      PHYSICAL EXAMINATION:   Vitals:   01/30/22 1420  BP: (!) 148/99  Pulse: 80  Temp: 98.3 F (36.8 C)  SpO2: 100%   Filed Weights   01/30/22 1420  Weight: 236 lb 3.2 oz (107.1 kg)    Physical Exam Vitals and nursing note  reviewed.  HENT:     Head: Normocephalic and atraumatic.     Mouth/Throat:     Pharynx: Oropharynx is clear.  Eyes:     Extraocular Movements: Extraocular movements intact.     Pupils: Pupils are equal, round, and reactive to light.  Cardiovascular:     Rate and Rhythm: Normal rate and regular rhythm.  Pulmonary:     Comments: Decreased breath sounds bilaterally.  Abdominal:     Palpations: Abdomen is soft.  Musculoskeletal:        General: Normal range of motion.     Cervical back: Normal range of motion.  Skin:    General: Skin is warm.  Neurological:     General: No focal deficit present.     Mental Status: She is alert and oriented to person, place, and time.  Psychiatric:        Behavior: Behavior normal.        Judgment: Judgment normal.    LABORATORY  DATA:  I have reviewed the data as listed Lab Results  Component Value Date   WBC 6.9 01/20/2022   HGB 11.1 (L) 01/20/2022   HCT 35.8 01/20/2022   MCV 77.3 (L) 01/20/2022   PLT 423 (H) 01/20/2022   Recent Labs    02/16/21 0735 03/26/21 0640 03/26/21 0730 01/20/22 1453  NA 137 135  --  137  K 3.8 3.9  --  3.8  CL 110 106  --  102  CO2 19* 20*  --  27  GLUCOSE 108* 118*  --  113*  BUN 10 12  --  12  CREATININE 0.71 0.84  --  0.85  CALCIUM 8.8* 8.9  --  9.2  GFRNONAA >60 >60  --   --   PROT 7.8  --  8.1 7.8  ALBUMIN 3.8  --  3.8  --   AST 23  --  22 42*  ALT 16  --  18 36*  ALKPHOS 52  --  55  --   BILITOT 0.5  --  0.5 0.3  BILIDIR  --   --  <0.1  --   IBILI  --   --  NOT CALCULATED  --      No results found.  Symptomatic anemia #Iron deficiency anemia-hemoglobin 11; ferritin 2-7 [PCP].  Poor tolerance to p.o. iron/constipation.  # Patient is symptomatic from worsening fatigue-recommend IV iron infusions. Discussed the potential acute infusion reactions with IV iron; which are quite rare.  Patient understands the risk; will proceed with infusions.  Patient had previous Venofer infusions without any  side effects.  #Etiology:likley from menorrhagia; awaiting US/ no gyn- on depot [Dr.Sowles].  Await work-up at this time.   Thank you,Dr.Sowles for allowing me to participate in the care of your pleasant patient. Please do not hesitate to contact me with questions or concerns in the interim.  # DISPOSITION: # no labs today # venofer weekly x4- start next week # follow up in 3 months; MD: labs-cbc/bmp; possible venofer-Dr.B     All questions were answered. The patient knows to call the clinic with any problems, questions or concerns.      Cammie Sickle, MD 01/30/2022 2:56 PM

## 2022-02-03 NOTE — Progress Notes (Signed)
She has uterine fibroid and needs to see GYn, please ask who she would like to see and place referral for heavy cycles and uterine fibroid. Thank you

## 2022-02-05 ENCOUNTER — Other Ambulatory Visit: Payer: Self-pay

## 2022-02-05 ENCOUNTER — Inpatient Hospital Stay: Payer: 59

## 2022-02-05 ENCOUNTER — Telehealth: Payer: Self-pay | Admitting: Family Medicine

## 2022-02-05 VITALS — BP 139/82 | HR 93

## 2022-02-05 DIAGNOSIS — D500 Iron deficiency anemia secondary to blood loss (chronic): Secondary | ICD-10-CM

## 2022-02-05 DIAGNOSIS — D509 Iron deficiency anemia, unspecified: Secondary | ICD-10-CM | POA: Diagnosis not present

## 2022-02-05 MED ORDER — SODIUM CHLORIDE 0.9 % IV SOLN
INTRAVENOUS | Status: DC
  Filled 2022-02-05: qty 250

## 2022-02-05 MED ORDER — IRON SUCROSE 20 MG/ML IV SOLN
200.0000 mg | Freq: Once | INTRAVENOUS | Status: AC
  Administered 2022-02-05: 200 mg via INTRAVENOUS
  Filled 2022-02-05: qty 10

## 2022-02-05 MED ORDER — IRON SUCROSE 20 MG/ML IV SOLN: 200.0000 mg | Freq: Once | INTRAVENOUS | Status: DC

## 2022-02-05 NOTE — Patient Instructions (Signed)
[facility] CANCER CTR AT [place]  Discharge Instructions: Thank you for choosing [facility] to provide your oncology and hematology care.  If you have a lab appointment with the [facility], please go directly to the [facility] and check in at the registration area.  Wear comfortable clothing and clothing appropriate for easy access to any Portacath or PICC line.   We strive to give you quality time with your provider. You may need to reschedule your appointment if you arrive late (15 or more minutes).  Arriving late affects you and other patients whose appointments are after yours.  Also, if you miss three or more appointments without notifying the office, you may be dismissed from the clinic at the providers discretion.      For prescription refill requests, have your pharmacy contact our office and allow 72 hours for refills to be completed.    Today you received the following : Venofer   To help prevent nausea and vomiting after your treatment, we encourage you to take your nausea medication as directed.  BELOW ARE SYMPTOMS THAT SHOULD BE REPORTED IMMEDIATELY: *FEVER GREATER THAN 100.4 F (38 C) OR HIGHER *CHILLS OR SWEATING *NAUSEA AND VOMITING THAT IS NOT CONTROLLED WITH YOUR NAUSEA MEDICATION *UNUSUAL SHORTNESS OF BREATH *UNUSUAL BRUISING OR BLEEDING *URINARY PROBLEMS (pain or burning when urinating, or frequent urination) *BOWEL PROBLEMS (unusual diarrhea, constipation, pain near the anus) TENDERNESS IN MOUTH AND THROAT WITH OR WITHOUT PRESENCE OF ULCERS (sore throat, sores in mouth, or a toothache) UNUSUAL RASH, SWELLING OR PAIN  UNUSUAL VAGINAL DISCHARGE OR ITCHING   Items with * indicate a potential emergency and should be followed up as soon as possible or go to the Emergency Department if any problems should occur.  Please show the CHEMOTHERAPY ALERT CARD or IMMUNOTHERAPY ALERT CARD at check-in to the Emergency Department and triage  nurse.  Should you have questions after your visit or need to cancel or reschedule your appointment, please contact [facility] CANCER [place] AT [place]  [phone] and follow the prompts.  Office hours are 8:00 a.m. to 4:30 p.m. Monday - Friday. Please note that voicemails left after 4:00 p.m. may not be returned until the following business day.  We are closed weekends and major holidays. You have access to a nurse at all times for urgent questions. Please call the main number to the clinic [phone] and follow the prompts.  For any non-urgent questions, you may also contact your provider using MyChart. We now offer e-Visits for anyone 52 and older to request care online for non-urgent symptoms. For details visit [email or web address].   Also download the MyChart app! Go to the app store, search "MyChart", open the app, select [facility], and log in with your MyChart username and password.  Due to Covid, a mask is required upon entering the hospital/clinic. If you do not have a mask, one will be given to you upon arrival. For doctor visits, patients may have 1 support person aged 8 or older with them. For treatment visits, patients cannot have anyone with them due to current Covid guidelines and our immunocompromised population.

## 2022-02-05 NOTE — Progress Notes (Signed)
Ok to proceed with venofer per [employer]

## 2022-02-05 NOTE — Telephone Encounter (Signed)
Pt calledin , dindt want to put in actual referral, yet she was returning call back she says, received yesterday for referral to obgyn. Please call back

## 2022-02-06 ENCOUNTER — Other Ambulatory Visit: Payer: Self-pay

## 2022-02-06 DIAGNOSIS — D259 Leiomyoma of uterus, unspecified: Secondary | ICD-10-CM

## 2022-02-06 NOTE — Telephone Encounter (Signed)
Spoke with patient, she stated she would like to be seen somewhere in [place] if possible. Referral placed.

## 2022-02-12 ENCOUNTER — Other Ambulatory Visit: Payer: Self-pay

## 2022-02-12 ENCOUNTER — Inpatient Hospital Stay: Payer: 59 | Attending: Internal Medicine

## 2022-02-12 VITALS — BP 133/64 | HR 69 | Temp 96.7°F | Resp 18

## 2022-02-12 DIAGNOSIS — Z01419 Encounter for gynecological examination (general) (routine) without abnormal findings: Secondary | ICD-10-CM | POA: Diagnosis not present

## 2022-02-12 DIAGNOSIS — D500 Iron deficiency anemia secondary to blood loss (chronic): Secondary | ICD-10-CM

## 2022-02-12 DIAGNOSIS — D649 Anemia, unspecified: Secondary | ICD-10-CM | POA: Insufficient documentation

## 2022-02-12 DIAGNOSIS — K219 Gastro-esophageal reflux disease without esophagitis: Secondary | ICD-10-CM | POA: Diagnosis not present

## 2022-02-12 MED ORDER — SODIUM CHLORIDE 0.9 % IV SOLN
Freq: Once | INTRAVENOUS | Status: AC
  Filled 2022-02-12: qty 250

## 2022-02-12 MED ORDER — IRON SUCROSE 20 MG/ML IV SOLN
200.0000 mg | Freq: Once | INTRAVENOUS | Status: AC
  Administered 2022-02-12: 200 mg via INTRAVENOUS
  Filled 2022-02-12: qty 10

## 2022-02-12 NOTE — Patient Instructions (Signed)
[facility]   ?Discharge Instructions: ?Thank you for choosing [facility] to provide your oncology and hematology care.  ?If you have a lab appointment with the Cancer Center, please go directly to the Cancer Center and check in at the registration area. ?  ?We strive to give you quality time with your provider. You may need to reschedule your appointment if you arrive late (15 or more minutes).  Arriving late affects you and other patients whose appointments are after yours.  Also, if you miss three or more appointments without notifying the office, you may be dismissed from the clinic at the provider?s discretion.    ?  ?For prescription refill requests, have your pharmacy contact our office and allow 72 hours for refills to be completed.   ? ?Today you received the following: Venofer.    ?  ?BELOW ARE SYMPTOMS THAT SHOULD BE REPORTED IMMEDIATELY: ?*FEVER GREATER THAN 100.4 F (38 ?C) OR HIGHER ?*CHILLS OR SWEATING ?*NAUSEA AND VOMITING THAT IS NOT CONTROLLED WITH YOUR NAUSEA MEDICATION ?*UNUSUAL SHORTNESS OF BREATH ?*UNUSUAL BRUISING OR BLEEDING ?*URINARY PROBLEMS (pain or burning when urinating, or frequent urination) ?*BOWEL PROBLEMS (unusual diarrhea, constipation, pain near the anus) ?TENDERNESS IN MOUTH AND THROAT WITH OR WITHOUT PRESENCE OF ULCERS (sore throat, sores in mouth, or a toothache) ?UNUSUAL RASH, SWELLING OR PAIN  ?UNUSUAL VAGINAL DISCHARGE OR ITCHING  ? ?Items with * indicate a potential emergency and should be followed up as soon as possible or go to the Emergency Department if any problems should occur. ? ?Should you have questions after your visit or need to cancel or reschedule your appointment, please contact [facility]  Dept: [phone]  and follow the prompts.  Office hours are 8:00 a.m. to 4:30 p.m. Monday - Friday. Please note that voicemails left after 4:00 p.m. may not be returned until the following  business day.  We are closed weekends and major holidays. You have access to a nurse at all times for urgent questions. Please call the main number to the clinic Dept: [phone] and follow the prompts. ? ?For any non-urgent questions, you may also contact your provider using MyChart. We now offer e-Visits for anyone 18 and older to request care online for non-urgent symptoms. For details visit [email or web address]. ?  ?Also download the MyChart app! Go to the app store, search "MyChart", open the app, select [facility], and log in with your MyChart username and password. ? ?Due to Covid, a mask is required upon entering the hospital/clinic. If you do not have a mask, one will be given to you upon arrival. For doctor visits, patients may have 1 support person aged 18 or older with them. For treatment visits, patients cannot have anyone with them due to current Covid guidelines and our immunocompromised population.  ?

## 2022-02-19 ENCOUNTER — Inpatient Hospital Stay: Payer: 59

## 2022-02-26 ENCOUNTER — Other Ambulatory Visit: Payer: Self-pay

## 2022-02-26 ENCOUNTER — Inpatient Hospital Stay: Payer: 59

## 2022-02-26 ENCOUNTER — Other Ambulatory Visit (HOSPITAL_COMMUNITY)
Admission: RE | Admit: 2022-02-26 | Discharge: 2022-02-26 | Disposition: A | Discharge status: Home / Self Care | Payer: 59 | Source: Ambulatory Visit | Attending: Obstetrics & Gynecology | Admitting: Obstetrics & Gynecology

## 2022-02-26 ENCOUNTER — Encounter: Payer: Self-pay | Admitting: Obstetrics & Gynecology

## 2022-02-26 ENCOUNTER — Ambulatory Visit (INDEPENDENT_AMBULATORY_CARE_PROVIDER_SITE_OTHER): Payer: 59 | Admitting: Obstetrics & Gynecology

## 2022-02-26 VITALS — BP 135/91 | HR 87 | Temp 97.1°F | Resp 18

## 2022-02-26 VITALS — BP 133/83 | HR 76 | Ht 65.0 in | Wt 238.0 lb

## 2022-02-26 DIAGNOSIS — N920 Excessive and frequent menstruation with regular cycle: Secondary | ICD-10-CM | POA: Diagnosis not present

## 2022-02-26 DIAGNOSIS — D649 Anemia, unspecified: Secondary | ICD-10-CM | POA: Diagnosis not present

## 2022-02-26 DIAGNOSIS — N946 Dysmenorrhea, unspecified: Secondary | ICD-10-CM | POA: Diagnosis not present

## 2022-02-26 DIAGNOSIS — Z01419 Encounter for gynecological examination (general) (routine) without abnormal findings: Secondary | ICD-10-CM | POA: Diagnosis not present

## 2022-02-26 MED ORDER — MEGESTROL ACETATE 40 MG PO TABS: ORAL_TABLET | ORAL | 3 refills | Status: DC

## 2022-02-26 MED ORDER — IRON SUCROSE 20 MG/ML IV SOLN
200.0000 mg | Freq: Once | INTRAVENOUS | Status: AC
  Administered 2022-02-26: 200 mg via INTRAVENOUS
  Filled 2022-02-26: qty 10

## 2022-02-26 NOTE — Progress Notes (Signed)
? ? ? ? ?Chief Complaint  ?Patient presents with  ? Fibroids  ? ? ? ? ?41 y.o. G0P0000 Patient's last menstrual period was 02/11/2022 (exact date). The current method of family planning is none. ? ?Outpatient Encounter Medications as of 02/26/2022  ?Medication Sig  ? amLODipine (NORVASC) 5 MG tablet Take 1 tablet (5 mg total) by mouth daily.  ? baclofen (LIORESAL) 10 MG tablet Take 1 tablet (10 mg total) by mouth 3 (three) times daily. For headaches  ? buPROPion (WELLBUTRIN XL) 150 MG 24 hr tablet Take 1 tablet (150 mg total) by mouth in the morning.  ? dicyclomine (BENTYL) 20 MG tablet Take 1 tablet (20 mg total) by mouth 4 (four) times daily -  before meals and at bedtime.  ? hydrochlorothiazide (HYDRODIURIL) 25 MG tablet Take 1 tablet (25 mg total) by mouth daily.  ? Iron, Ferrous Sulfate, 325 (65 Fe) MG TABS Take 325 mg by mouth daily.  ? levothyroxine (SYNTHROID) 100 MCG tablet Take 1 tablet (100 mcg total) by mouth every morning.  ? megestrol (MEGACE) 40 MG tablet 3 tablets a day for 5 days, 2 tablets a day for 5 days then 1 tablet daily  ? pantoprazole (PROTONIX) 40 MG tablet Take 1 tablet (40 mg total) by mouth daily.  ? [DISCONTINUED] Cetirizine HCl 10 MG CAPS Take 1 capsule (10 mg total) by mouth daily.  ? ?No facility-administered encounter medications on file as of 02/26/2022.  ? ? ?Subjective ?Pt with long history of heavy periods ?Last couple of years much worse ?Heavier ?Crampier ?Clots ?She has iron deficiency anemia ?Has to cal lout of work due to her heavy ?Uses the super heavy overnights ?Soils sheets clothes ? ?Past Medical History:  ?Diagnosis Date  ? GERD (gastroesophageal reflux disease)   ? Hypertension   ? Hypothyroid   ? Hypothyroid 01/25/2018  ? Iron deficiency anemia due to chronic blood loss 02/16/2018  ? ? ?Past Surgical History:  ?Procedure Laterality Date  ? COLONOSCOPY WITH PROPOFOL N/A 09/08/2018  ? Procedure: COLONOSCOPY WITH PROPOFOL;  Surgeon: Lin Landsman, MD;  Location: [facility]  ENDOSCOPY;  Service: Gastroenterology;  Laterality: N/A;  ? ESOPHAGOGASTRODUODENOSCOPY (EGD) WITH PROPOFOL N/A 09/08/2018  ? Procedure: ESOPHAGOGASTRODUODENOSCOPY (EGD) WITH PROPOFOL;  Surgeon: Lin Landsman, MD;  Location: [facility] ENDOSCOPY;  Service: Gastroenterology;  Laterality: N/A;  ? ? ?OB History   ? ? Gravida  ?0  ? Para  ?0  ? Term  ?0  ? Preterm  ?0  ? AB  ?0  ? Living  ?0  ?  ? ? SAB  ?0  ? IAB  ?0  ? Ectopic  ?0  ? Multiple  ?0  ? Live Births  ?0  ?   ?  ?  ? ? ?Allergies  ?Allergen Reactions  ? Celebrex [Celecoxib]   ?  "shakes/tremors"  ? Sesame Seed (Diagnostic) Itching  ? Lisinopril Rash  ? ? ?Social History  ? ?Socioeconomic History  ? Marital status: Married  ?  Spouse name: Windy Canny  ? Number of children: 0  ? Years of education: Not on file  ? Highest education level: Associate degree: occupational, Hotel manager, or vocational program  ?Occupational History  ? Occupation: stocker   ?  Employer: [employer]  ?Tobacco Use  ? Smoking status: Every Day  ?  Packs/day: 0.75  ?  Years: 20.00  ?  Pack years: 15.00  ?  Types: Cigarettes  ?  Start date: 01/26/2000  ?  Passive exposure:  Never  ? Smokeless tobacco: Never  ?Vaping Use  ? Vaping Use: Never used  ?Substance and Sexual Activity  ? Alcohol use: Yes  ?  Alcohol/week: 2.0 standard drinks  ?  Types: 2 Cans of beer per week  ? Drug use: No  ? Sexual activity: Yes  ?  Birth control/protection: Other-see comments  ?  Comment: homosexual, injection due to heavy vag. bleeding  ?Other Topics Concern  ? Not on file  ?Social History Narrative  ? She moved to [place] from [place], [place] in the Summer of 2018; She lives with her wife She is works at Librarian, academic, [employer]. wife had BKA of right leg in 2020  ? ?Social Determinants of Health  ? ?Financial Resource Strain: Not on file  ?Food Insecurity: Not on file  ?Transportation Needs: Not on file  ?Physical Activity: Not on file  ?Stress: Not on file  ?Social Connections: Not on file  ? ? ?Family  History  ?Problem Relation Age of Onset  ? Hypertension Mother   ? Hypertension Father   ? Stroke Father   ? ? ?Medications:       ?Current Outpatient Medications:  ?  amLODipine (NORVASC) 5 MG tablet, Take 1 tablet (5 mg total) by mouth daily., Disp: 90 tablet, Rfl: 0 ?  baclofen (LIORESAL) 10 MG tablet, Take 1 tablet (10 mg total) by mouth 3 (three) times daily. For headaches, Disp: 30 each, Rfl: 0 ?  buPROPion (WELLBUTRIN XL) 150 MG 24 hr tablet, Take 1 tablet (150 mg total) by mouth in the morning., Disp: 90 tablet, Rfl: 0 ?  dicyclomine (BENTYL) 20 MG tablet, Take 1 tablet (20 mg total) by mouth 4 (four) times daily -  before meals and at bedtime., Disp: 40 tablet, Rfl: 0 ?  hydrochlorothiazide (HYDRODIURIL) 25 MG tablet, Take 1 tablet (25 mg total) by mouth daily., Disp: 90 tablet, Rfl: 0 ?  Iron, Ferrous Sulfate, 325 (65 Fe) MG TABS, Take 325 mg by mouth daily., Disp: 180 tablet, Rfl: 0 ?  levothyroxine (SYNTHROID) 100 MCG tablet, Take 1 tablet (100 mcg total) by mouth every morning., Disp: 90 tablet, Rfl: 1 ?  megestrol (MEGACE) 40 MG tablet, 3 tablets a day for 5 days, 2 tablets a day for 5 days then 1 tablet daily, Disp: 45 tablet, Rfl: 3 ?  pantoprazole (PROTONIX) 40 MG tablet, Take 1 tablet (40 mg total) by mouth daily., Disp: 90 tablet, Rfl: 1 ? ?Objective ?Blood pressure 133/83, pulse 76, height '5\' 5"'$  (1.651 m), weight 238 lb (108 kg), last menstrual period 02/11/2022. ? ?General WDWN female NAD ?Vulva:  normal appearing vulva with no masses, tenderness or lesions ?Vagina:  normal mucosa, no discharge ?Cervix:  Normal no lesions ?Uterus: 12 weeks size  ?Adnexa: ovaries:present,  normal adnexa in size, nontender and no masses ? ? ?Pertinent ROS ?No burning with urination, frequency or urgency ?No nausea, vomiting or diarrhea ?Nor fever chills or other constitutional symptoms ? ? ?Labs or studies ?Reviewed her sonogram and recents labs ? ? ? ?Impression ?Diagnoses this Encounter:: ?  ICD-10-CM   ?1.  Menorrhagia with regular cycle  N92.0   ?  ?2. Symptomatic anemia  D64.9   ?  ?3. Dysmenorrhea  N94.6   ?  ?4. Encounter for cervical Pap smear with pelvic exam  Z01.419 Cytology - PAP( [place])  ?  ? ? ?Established relevant diagnosis(es): ? ? ?Plan/Recommendations: ?Meds ordered this encounter  ?Medications  ? megestrol (MEGACE) 40 MG tablet  ?  Sig: 3 tablets a day for 5 days, 2 tablets a day for 5 days then 1 tablet daily  ?  Dispense:  45 tablet  ?  Refill:  3  ? ? ?Labs or Scans Ordered: ?No orders of the defined types were placed in this encounter. ? ? ?Management:: ?Megestrol trial ?Recheck 6 weeks fo response ? ?Follow up ?No follow-ups on file. ? ? ? ? ? ? All questions were answered. ? ? ?

## 2022-02-26 NOTE — Patient Instructions (Signed)
[facility]  Discharge Instructions: ?Thank you for choosing [facility] to provide your oncology and hematology care.  ?If you have a lab appointment with the Cancer Center, please go directly to the Cancer Center and check in at the registration area. ? ?Wear comfortable clothing and clothing appropriate for easy access to any Portacath or PICC line.  ? ?We strive to give you quality time with your provider. You may need to reschedule your appointment if you arrive late (15 or more minutes).  Arriving late affects you and other patients whose appointments are after yours.  Also, if you miss three or more appointments without notifying the office, you may be dismissed from the clinic at the provider?s discretion.    ?  ?For prescription refill requests, have your pharmacy contact our office and allow 72 hours for refills to be completed.   ? ?Today you received the following chemotherapy and/or immunotherapy agents VENOFER    ?  ?To help prevent nausea and vomiting after your treatment, we encourage you to take your nausea medication as directed. ? ?BELOW ARE SYMPTOMS THAT SHOULD BE REPORTED IMMEDIATELY: ?*FEVER GREATER THAN 100.4 F (38 ?C) OR HIGHER ?*CHILLS OR SWEATING ?*NAUSEA AND VOMITING THAT IS NOT CONTROLLED WITH YOUR NAUSEA MEDICATION ?*UNUSUAL SHORTNESS OF BREATH ?*UNUSUAL BRUISING OR BLEEDING ?*URINARY PROBLEMS (pain or burning when urinating, or frequent urination) ?*BOWEL PROBLEMS (unusual diarrhea, constipation, pain near the anus) ?TENDERNESS IN MOUTH AND THROAT WITH OR WITHOUT PRESENCE OF ULCERS (sore throat, sores in mouth, or a toothache) ?UNUSUAL RASH, SWELLING OR PAIN  ?UNUSUAL VAGINAL DISCHARGE OR ITCHING  ? ?Items with * indicate a potential emergency and should be followed up as soon as possible or go to the Emergency Department if any problems should occur. ? ?Please show the CHEMOTHERAPY ALERT CARD or IMMUNOTHERAPY ALERT CARD at check-in to the  Emergency Department and triage nurse. ? ?Should you have questions after your visit or need to cancel or reschedule your appointment, please contact [facility]  [phone] and follow the prompts.  Office hours are 8:00 a.m. to 4:30 p.m. Monday - Friday. Please note that voicemails left after 4:00 p.m. may not be returned until the following business day.  We are closed weekends and major holidays. You have access to a nurse at all times for urgent questions. Please call the main number to the clinic [phone] and follow the prompts. ? ?For any non-urgent questions, you may also contact your provider using MyChart. We now offer e-Visits for anyone 18 and older to request care online for non-urgent symptoms. For details visit [email or web address]. ?  ?Also download the MyChart app! Go to the app store, search "MyChart", open the app, select [facility], and log in with your MyChart username and password. ? ?Due to Covid, a mask is required upon entering the hospital/clinic. If you do not have a mask, one will be given to you upon arrival. For doctor visits, patients may have 1 support person aged 18 or older with them. For treatment visits, patients cannot have anyone with them due to current Covid guidelines and our immunocompromised population.  ? ?Iron Sucrose Injection ?What is this medication? ?IRON SUCROSE (EYE ern SOO krose) treats low levels of iron (iron deficiency anemia) in people with kidney disease. Iron is a mineral that plays an important role in making red blood cells, which carry oxygen from your lungs to the rest of your body. ?This medicine may   be used for other purposes; ask your health care provider or pharmacist if you have questions. ?COMMON BRAND NAME(S): Venofer ?What should I tell my care team before I take this medication? ?They need to know if you have any of these conditions: ?Anemia not caused by low iron levels ?Heart disease ?High levels of  iron in the blood ?Kidney disease ?Liver disease ?An unusual or allergic reaction to iron, other medications, foods, dyes, or preservatives ?Pregnant or trying to get pregnant ?Breast-feeding ?How should I use this medication? ?This medication is for infusion into a vein. It is given in a hospital or clinic setting. ?Talk to your care team about the use of this medication in children. While this medication may be prescribed for children as young as 2 years for selected conditions, precautions do apply. ?Overdosage: If you think you have taken too much of this medicine contact a poison control center or emergency room at once. ?NOTE: This medicine is only for you. Do not share this medicine with others. ?What if I miss a dose? ?It is important not to miss your dose. Call your care team if you are unable to keep an appointment. ?What may interact with this medication? ?Do not take this medication with any of the following: ?Deferoxamine ?Dimercaprol ?Other iron products ?This medication may also interact with the following: ?Chloramphenicol ?Deferasirox ?This list may not describe all possible interactions. Give your health care provider a list of all the medicines, herbs, non-prescription drugs, or dietary supplements you use. Also tell them if you smoke, drink alcohol, or use illegal drugs. Some items may interact with your medicine. ?What should I watch for while using this medication? ?Visit your care team regularly. Tell your care team if your symptoms do not start to get better or if they get worse. You may need blood work done while you are taking this medication. ?You may need to follow a special diet. Talk to your care team. Foods that contain iron include: whole grains/cereals, dried fruits, beans, or peas, leafy green vegetables, and organ meats (liver, kidney). ?What side effects may I notice from receiving this medication? ?Side effects that you should report to your care team as soon as  possible: ?Allergic reactions--skin rash, itching, hives, swelling of the face, lips, tongue, or throat ?Low blood pressure--dizziness, feeling faint or lightheaded, blurry vision ?Shortness of breath ?Side effects that usually do not require medical attention (report to your care team if they continue or are bothersome): ?Flushing ?Headache ?Joint pain ?Muscle pain ?Nausea ?Pain, redness, or irritation at injection site ?This list may not describe all possible side effects. Call your doctor for medical advice about side effects. You may report side effects to FDA at 1-800-FDA-1088. ?Where should I keep my medication? ?This medication is given in a hospital or clinic and will not be stored at home. ?NOTE: This sheet is a summary. It may not cover all possible information. If you have questions about this medicine, talk to your doctor, pharmacist, or health care provider. ?? 2022 Elsevier/Gold Standard (2021-04-25 00:00:00) ? ?

## 2022-03-02 LAB — CYTOLOGY - PAP
Chlamydia: NEGATIVE
Comment: NEGATIVE
Comment: NEGATIVE
Comment: NORMAL
Diagnosis: NEGATIVE
High risk HPV: NEGATIVE
Neisseria Gonorrhea: NEGATIVE

## 2022-03-04 ENCOUNTER — Inpatient Hospital Stay: Payer: 59

## 2022-03-19 ENCOUNTER — Encounter: Payer: Medicaid Other | Admitting: Family Medicine

## 2022-04-09 ENCOUNTER — Ambulatory Visit: Payer: 59 | Admitting: Obstetrics & Gynecology

## 2022-04-10 ENCOUNTER — Telehealth (INDEPENDENT_AMBULATORY_CARE_PROVIDER_SITE_OTHER): Payer: PRIVATE HEALTH INSURANCE | Admitting: Physician Assistant

## 2022-04-10 ENCOUNTER — Ambulatory Visit: Payer: Self-pay

## 2022-04-10 DIAGNOSIS — J069 Acute upper respiratory infection, unspecified: Secondary | ICD-10-CM

## 2022-04-10 MED ORDER — BENZONATATE 100 MG PO CAPS: 100.0000 mg | ORAL_CAPSULE | Freq: Two times a day (BID) | ORAL | 0 refills | Status: AC | PRN

## 2022-04-10 NOTE — Progress Notes (Signed)
? ?  Virtual Visit via Video Note ? ?I connected with Lynn Wilson on 04/10/22 at  9:00 AM EDT by a video enabled telemedicine application and verified that I am speaking with the correct person using two identifiers. ? ?Today's Provider: Talitha Givens, MHS, PA-C ?Introduced myself to the patient as a Journalist, newspaper and provided education on APPs in clinical practice.  ? ? ?Location: ?Patient: At home in [place] ?Provider: [place], [place] ?  ?I discussed the limitations of evaluation and management by telemedicine and the availability of in person appointments. The patient expressed understanding and agreed to proceed. ? ?History of Present Illness: ?States she thinks she has a cold ?Has had congestion, rhinorrhea, productive cough,body aches, fatigue  ?Denies, sore throat, fever, nausea, diarrhea, vomiting, ear pain, SOB,  ?States it started yesterday ?Intervention: Cold and flu ?States the Nyquil seems to be more beneficial ? ?Reports recent sick contacts in home ?Home COVID testing - performed and negative ? ? ?  ?Observations/Objective: ? ?Due to the nature of the virtual visit, physical exam and observations are limited. ?Able to obtain the following observations: ? ?Alert, oriented female in no apparent distress ?Coughing on video and during conversation  ?Appears comfortable, in no acute distress.  ?No scleral injection, no appreciated hoarseness, tachypnea, wheeze or strider. Able to maintain conversation without signs/ indications of distress or strain  ? ? ?Assessment and Plan: ? ?Visit with patient indicates symptoms headaches, productive cough, nasal congestion since yesterday congruent with acute URI that is likely viral in nature  ?Patient has tested negative for COVID  ?Reports recent sick contacts in home with similar symptoms  ?Due to nature and duration of symptoms recommended treatment regimen is symptomatic relief and follow up if needed ?Discussed with patient the various  viral and bacterial etiologies of current illness and appropriate course of treatment ?Discussed OTC medication options for multisymptom relief such as Dayquil/Nyquil, Theraflu, AlkaSeltzer, etc. ?Will provide Tessalon pearls 100 mg PO BID PRN to assist with cough ?Discussed return precautions if symptoms are not improving or worsen over next 5-7 days.  ? ? ?Follow Up Instructions: ? ?  ?I discussed the assessment and treatment plan with the patient. The patient was provided an opportunity to ask questions and all were answered. The patient agreed with the plan and demonstrated an understanding of the instructions. ?  ?The patient was advised to call back or seek an in-person evaluation if the symptoms worsen or if the condition fails to improve as anticipated. ? ?I provided 10 minutes of non-face-to-face time during this encounter. ? ?The entirety of the information documented in the History of Present Illness, Review of Systems and Physical Exam were personally obtained by me. Portions of this information were initially documented by the CMA and reviewed by me for thoroughness and accuracy.  ? ?Kirstina Leinweber, MHS, PA-C ?[facility] ?[facility]  ? ? ?

## 2022-04-10 NOTE — Patient Instructions (Addendum)
Based on your described symptoms and the duration of symptoms it is likely that you have a viral upper respiratory infection (often called a "cold")  Symptoms can last for 3-10 days with lingering cough and intermittent symptoms lasting weeks after that.  The goal of treatment at this time is to reduce your symptoms and discomfort   I have sent in Tessalon pearls for you to take twice per day to help with your cough  You can use over the counter medications such as Dayquil/Nyquil, AlkaSeltzer formulations, etc to provide further relief of symptoms according to the manufacturer's instructions  If preferred you can use Coricidin to manage your symptoms rather than those medications mentioned above.    If your symptoms do not improve or become worse in the next 5-7 days please make an apt at the office so we can see you  Go to the ER if you begin to have more serious symptoms such as shortness of breath, trouble breathing, loss of consciousness, swelling around the eyes, high fever, severe lasting headaches, vision changes or neck pain/stiffness.   

## 2022-04-10 NOTE — Telephone Encounter (Signed)
? ? ? ?  Chief Complaint: Productive cough with green mucus,runny nose,body aches, Chest pain with coughing ?Symptoms: Above ?Frequency: Started yesterday ?Pertinent Negatives: Patient denies fever,SOB ?Disposition: '[]'$ ED /'[]'$ Urgent Care (no appt availability in office) / '[x]'$ Appointment(In office/virtual)/ '[]'$  [facility]/ '[]'$ Home Care/ '[]'$ Refused Recommended Disposition /'[]'$ [facility]/ '[]'$  Follow-up with PCP ?Additional Notes: Go to ED for worsening of symptoms.  ?Reason for Disposition ? [1] Continuous (nonstop) coughing interferes with work or school AND [2] no improvement using cough treatment per Care Advice ? ?Answer Assessment - Initial Assessment Questions ?1. ONSET: "When did the cough begin?"  ?    Yesterday ?2. SEVERITY: "How bad is the cough today?"  ?    Severe ?3. SPUTUM: "Describe the color of your sputum" (none, dry cough; clear, white, yellow, green) ?    Green ?4. HEMOPTYSIS: "Are you coughing up any blood?" If so ask: "How much?" (flecks, streaks, tablespoons, etc.) ?    No ?5. DIFFICULTY BREATHING: "Are you having difficulty breathing?" If Yes, ask: "How bad is it?" (e.g., mild, moderate, severe)  ?  - MILD: No SOB at rest, mild SOB with walking, speaks normally in sentences, can lie down, no retractions, pulse < 100.  ?  - MODERATE: SOB at rest, SOB with minimal exertion and prefers to sit, cannot lie down flat, speaks in phrases, mild retractions, audible wheezing, pulse 100-120.  ?  - SEVERE: Very SOB at rest, speaks in single words, struggling to breathe, sitting hunched forward, retractions, pulse > 120  ?    No ?6. FEVER: "Do you have a fever?" If Yes, ask: "What is your temperature, how was it measured, and when did it start?" ?    No ?7. CARDIAC HISTORY: "Do you have any history of heart disease?" (e.g., heart attack, congestive heart failure)  ?    No ?8. LUNG HISTORY: "Do you have any history of lung disease?"  (e.g., pulmonary embolus, asthma, emphysema) ?    No ?9. PE  RISK FACTORS: "Do you have a history of blood clots?" (or: recent major surgery, recent prolonged travel, bedridden) ?    No ?10. OTHER SYMPTOMS: "Do you have any other symptoms?" (e.g., runny nose, wheezing, chest pain) ?      Runny nose, chest pain, body aches ?11. PREGNANCY: "Is there any chance you are pregnant?" "When was your last menstrual period?" ?      No ?12. TRAVEL: "Have you traveled out of the country in the last month?" (e.g., travel history, exposures) ?      No ? ?Protocols used: Cough - Acute Productive-A-AH ? ?

## 2022-04-16 ENCOUNTER — Ambulatory Visit: Payer: 59 | Admitting: Obstetrics & Gynecology

## 2022-04-16 ENCOUNTER — Encounter: Payer: Self-pay | Admitting: Obstetrics & Gynecology

## 2022-04-16 VITALS — BP 137/95 | HR 102 | Ht 65.0 in | Wt 238.0 lb

## 2022-04-16 DIAGNOSIS — N946 Dysmenorrhea, unspecified: Secondary | ICD-10-CM | POA: Diagnosis not present

## 2022-04-16 DIAGNOSIS — D219 Benign neoplasm of connective and other soft tissue, unspecified: Secondary | ICD-10-CM | POA: Diagnosis not present

## 2022-04-16 DIAGNOSIS — D649 Anemia, unspecified: Secondary | ICD-10-CM | POA: Diagnosis not present

## 2022-04-16 DIAGNOSIS — N920 Excessive and frequent menstruation with regular cycle: Secondary | ICD-10-CM | POA: Diagnosis not present

## 2022-04-16 LAB — POCT HEMOGLOBIN: Hemoglobin: 11.5 g/dL (ref 11–14.6)

## 2022-04-16 MED ORDER — MEGESTROL ACETATE 40 MG PO TABS: ORAL_TABLET | ORAL | 11 refills | Status: DC

## 2022-04-16 NOTE — Progress Notes (Deleted)
Name: Lynn Wilson   MRN: [id number]    DOB: 1981-01-18   Date:04/16/2022       Progress Note  Subjective  Chief Complaint  Follow up   HPI  Migraine: she states only had a couple of episodes in 2020, she takes muscle relaxer prn for migraine , she states if she avoids chocolate, a lot of sweets symptoms are controlled. She states baclofen works well for her. She asked to see neurologist since she has noticed a new type of headache, nuchal area, usually in the morning dull but can shoot down her spine and it causes her to stop what she is doing do to intensity. Not associated with weakness, nausea or vomiting. She is having pica , we will check labs. Since she also has a history of migraines we will refer her to neurologist    HTN: she is back on HCTZ 25 mg  and norvasc 5 mg, bp today is at goal. She has intermittent chest pain and has gone to [facility] in the past, usually triggered by stress. She was supposed to follow up with cardiologist, referred by [facility] in the past  Advised to correct iron and if symptoms than we can refer her to cardiologist    Smoker's cough : Smoking 1 pack daily. She has episodes of bronchitis , she has also noticed a cough that is productive in am's about three times a week, discussed importance of quitting. She is willing to try Wellbutrin    Hypothyroidism: she is compliant with thyroid medication. No change in bowel movements she has chronic dry skin, no dysphagia. We will recheck labs    Iron deficiency anemia: regular but heavy cycles even clots,  used to get iron infusion, we placed her on Depo in January 22 however she stopped last fall and started having cycles again two months ago and they are worse, heavier, still with clots but now has severe cramping.  She also has hemorrhoids without rectal bleeding, chronic gastritis per EGD, seen by Dr. Marius Ditch , she has been taking pantoprazole twice daily, but advised her to try going down to once a day She has noticed pica  lately   Pre-diabetes/Metabolic Syndrome: she has episodes of polyphagia, but denies polydipsia or polyuria   Dysmenorrhea secondary and past couple of months very heavy cycles with clots: she was on depo for a few months to control periods but she continue to have break through bleeding and she felt worse. She states she never had cramps like she is now    Morbid obesity: BMI above 35 with co-morbidities such as Pre-diabetes , HTN. Discussed importance of following a healthy diet  Patient Active Problem List   Diagnosis Date Noted   Symptomatic anemia 01/30/2022   Chronic left shoulder pain 02/22/2019   Chronic gastritis without bleeding 02/22/2019   Menorrhagia with regular cycle 05/10/2018   Internal bleeding hemorrhoids 05/10/2018   Migraine without aura and responsive to treatment 02/16/2018   Iron deficiency anemia due to chronic blood loss 02/16/2018   Hyperglycemia 83/41/9622   Metabolic syndrome 29/79/8921   Hypertension 01/25/2018   GERD (gastroesophageal reflux disease) 01/25/2018   Hypothyroid 01/25/2018   Morbid obesity ([place]) 01/25/2018    Past Surgical History:  Procedure Laterality Date   COLONOSCOPY WITH PROPOFOL N/A 09/08/2018   Procedure: COLONOSCOPY WITH PROPOFOL;  Surgeon: Lin Landsman, MD;  Location: [facility];  Service: Gastroenterology;  Laterality: N/A;   ESOPHAGOGASTRODUODENOSCOPY (EGD) WITH PROPOFOL N/A 09/08/2018   Procedure: ESOPHAGOGASTRODUODENOSCOPY (  EGD) WITH PROPOFOL;  Surgeon: Lin Landsman, MD;  Location: [facility] ENDOSCOPY;  Service: Gastroenterology;  Laterality: N/A;    Family History  Problem Relation Age of Onset   Hypertension Mother    Hypertension Father    Stroke Father     Social History   Tobacco Use   Smoking status: Every Day    Packs/day: 0.75    Years: 20.00    Pack years: 15.00    Types: Cigarettes    Start date: 01/26/2000    Passive exposure: Never   Smokeless tobacco: Never  Substance Use Topics    Alcohol use: Yes    Alcohol/week: 2.0 standard drinks    Types: 2 Cans of beer per week     Current Outpatient Medications:    amLODipine (NORVASC) 5 MG tablet, Take 1 tablet (5 mg total) by mouth daily., Disp: 90 tablet, Rfl: 0   baclofen (LIORESAL) 10 MG tablet, Take 1 tablet (10 mg total) by mouth 3 (three) times daily. For headaches, Disp: 30 each, Rfl: 0   benzonatate (TESSALON) 100 MG capsule, Take 1 capsule (100 mg total) by mouth 2 (two) times daily as needed for up to 7 days for cough., Disp: 14 capsule, Rfl: 0   buPROPion (WELLBUTRIN XL) 150 MG 24 hr tablet, Take 1 tablet (150 mg total) by mouth in the morning., Disp: 90 tablet, Rfl: 0   dicyclomine (BENTYL) 20 MG tablet, Take 1 tablet (20 mg total) by mouth 4 (four) times daily -  before meals and at bedtime., Disp: 40 tablet, Rfl: 0   hydrochlorothiazide (HYDRODIURIL) 25 MG tablet, Take 1 tablet (25 mg total) by mouth daily., Disp: 90 tablet, Rfl: 0   Iron, Ferrous Sulfate, 325 (65 Fe) MG TABS, Take 325 mg by mouth daily., Disp: 180 tablet, Rfl: 0   levothyroxine (SYNTHROID) 100 MCG tablet, Take 1 tablet (100 mcg total) by mouth every morning., Disp: 90 tablet, Rfl: 1   megestrol (MEGACE) 40 MG tablet, 3 tablets a day for 5 days, 2 tablets a day for 5 days then 1 tablet daily, Disp: 45 tablet, Rfl: 3   pantoprazole (PROTONIX) 40 MG tablet, Take 1 tablet (40 mg total) by mouth daily., Disp: 90 tablet, Rfl: 1  Allergies  Allergen Reactions   Celebrex [Celecoxib]     "shakes/tremors"   Sesame Seed (Diagnostic) Itching   Lisinopril Rash    I personally reviewed {Reviewed:14835} with the patient/caregiver today.   ROS  ***  Objective  There were no vitals filed for this visit.  There is no height or weight on file to calculate BMI.  Physical Exam ***  Recent Results (from the past 2160 hour(s))  Lipid panel     Status: Abnormal   Collection Time: 01/20/22  2:53 PM  Result Value Ref Range   Cholesterol 191 <200  mg/dL   HDL 54 > OR = 50 mg/dL   Triglycerides 127 <150 mg/dL   LDL Cholesterol (Calc) 113 (H) mg/dL (calc)    Comment: Reference range: <100 . Desirable range <100 mg/dL for primary prevention;   <70 mg/dL for patients with CHD or diabetic patients  with > or = 2 CHD risk factors. Marland Kitchen LDL-C is now calculated using the Martin-Hopkins  calculation, which is a validated novel method providing  better accuracy than the Friedewald equation in the  estimation of LDL-C.  Cresenciano Genre et al. Annamaria Helling. 2353;614(43): 2061-2068  (http://education.[facility].com/faq/FAQ164)    Total CHOL/HDL Ratio 3.5 <5.0 (calc)   Non-HDL  Cholesterol (Calc) 137 (H) <130 mg/dL (calc)    Comment: For patients with diabetes plus 1 major ASCVD risk  factor, treating to a non-HDL-C goal of <100 mg/dL  (LDL-C of <70 mg/dL) is considered a therapeutic  option.   TSH     Status: None   Collection Time: 01/20/22  2:53 PM  Result Value Ref Range   TSH 3.23 mIU/L    Comment:           Reference Range .           > or = 20 Years  0.40-4.50 .                Pregnancy Ranges           First trimester    0.26-2.66           Second trimester   0.55-2.73           Third trimester    0.43-2.91   Hemoglobin A1c     Status: Abnormal   Collection Time: 01/20/22  2:53 PM  Result Value Ref Range   Hgb A1c MFr Bld 5.8 (H) <5.7 % of total Hgb    Comment: For someone without known diabetes, a hemoglobin  A1c value between 5.7% and 6.4% is consistent with prediabetes and should be confirmed with a  follow-up test. . For someone with known diabetes, a value <7% indicates that their diabetes is well controlled. A1c targets should be individualized based on duration of diabetes, age, comorbid conditions, and other considerations. . This assay result is consistent with an increased risk of diabetes. . Currently, no consensus exists regarding use of hemoglobin A1c for diagnosis of diabetes for children. .    Mean  Plasma Glucose 120 mg/dL   eAG (mmol/L) 6.6 mmol/L  CBC with Differential/Platelet     Status: Abnormal   Collection Time: 01/20/22  2:53 PM  Result Value Ref Range   WBC 6.9 3.8 - 10.8 Thousand/uL   RBC 4.63 3.80 - 5.10 Million/uL   Hemoglobin 11.1 (L) 11.7 - 15.5 g/dL   HCT 35.8 35.0 - 45.0 %   MCV 77.3 (L) 80.0 - 100.0 fL   MCH 24.0 (L) 27.0 - 33.0 pg   MCHC 31.0 (L) 32.0 - 36.0 g/dL   RDW 15.9 (H) 11.0 - 15.0 %   Platelets 423 (H) 140 - 400 Thousand/uL   MPV 10.1 7.5 - 12.5 fL   Neutro Abs 3,919 1,500 - 7,800 cells/uL   Lymphs Abs 2,443 850 - 3,900 cells/uL   Absolute Monocytes 421 200 - 950 cells/uL   Eosinophils Absolute 110 15 - 500 cells/uL   Basophils Absolute 7 0 - 200 cells/uL   Neutrophils Relative % 56.8 %   Total Lymphocyte 35.4 %   Monocytes Relative 6.1 %   Eosinophils Relative 1.6 %   Basophils Relative 0.1 %  COMPLETE METABOLIC PANEL WITH GFR     Status: Abnormal   Collection Time: 01/20/22  2:53 PM  Result Value Ref Range   Glucose, Bld 113 (H) 65 - 99 mg/dL    Comment: .            Fasting reference interval . For someone without known diabetes, a glucose value between 100 and 125 mg/dL is consistent with prediabetes and should be confirmed with a follow-up test. .    BUN 12 7 - 25 mg/dL   Creat 0.85 0.50 - 0.97 mg/dL   eGFR 90 > OR =  60 mL/min/1.27m    Comment: The eGFR is based on the CKD-EPI 2021 equation. To calculate  the new eGFR from a previous Creatinine or Cystatin C result, go to https://www.kidney.org/professionals/ kdoqi/gfr%5Fcalculator    BUN/Creatinine Ratio NOT APPLICABLE 6 - 22 (calc)   Sodium 137 135 - 146 mmol/L   Potassium 3.8 3.5 - 5.3 mmol/L   Chloride 102 98 - 110 mmol/L   CO2 27 20 - 32 mmol/L   Calcium 9.2 8.6 - 10.2 mg/dL   Total Protein 7.8 6.1 - 8.1 g/dL   Albumin 4.1 3.6 - 5.1 g/dL   Globulin 3.7 1.9 - 3.7 g/dL (calc)   AG Ratio 1.1 1.0 - 2.5 (calc)   Total Bilirubin 0.3 0.2 - 1.2 mg/dL   Alkaline phosphatase  (APISO) 93 31 - 125 U/L   AST 42 (H) 10 - 30 U/L   ALT 36 (H) 6 - 29 U/L  Iron, TIBC and Ferritin Panel     Status: Abnormal   Collection Time: 01/20/22  2:53 PM  Result Value Ref Range   Iron 27 (L) 40 - 190 mcg/dL   TIBC 451 (H) 250 - 450 mcg/dL (calc)   %SAT 6 (L) 16 - 45 % (calc)   Ferritin 7 (L) 16 - 154 ng/mL  Cytology - PAP( [place])     Status: None   Collection Time: 02/26/22 11:11 AM  Result Value Ref Range   High risk HPV Negative    Neisseria Gonorrhea Negative    Chlamydia Negative    Adequacy      Satisfactory for evaluation; transformation zone component PRESENT.   Diagnosis      - Negative for intraepithelial lesion or malignancy (NILM)   Comment Normal Reference Range HPV - Negative    Comment Normal Reference [place] Chlamydia - Negative    Comment      Normal Reference Range Neisseria Gonorrhea - Negative    Diabetic Foot Exam: Diabetic Foot Exam - Simple   No data filed    ***  PHQ2/9:    04/10/2022    8:35 AM 01/20/2022    2:01 PM 09/26/2021   11:23 AM 07/16/2021    1:35 PM 04/11/2021    2:46 PM  Depression screen PHQ 2/9  Decreased Interest 0 0 0 0 0  Down, Depressed, Hopeless 0 0 0 0 0  PHQ - 2 Score 0 0 0 0 0  Altered sleeping 0 0     Tired, decreased energy 0 3     Change in appetite 0 0     Feeling bad or failure about yourself  0 0     Trouble concentrating 0 0     Moving slowly or fidgety/restless 0 0     Suicidal thoughts 0 0     PHQ-9 Score 0 3       phq 9 is {gen pos [id number]}***  Fall Risk:    04/10/2022    8:35 AM 01/20/2022    2:00 PM 09/26/2021   11:22 AM 07/16/2021    1:34 PM 04/11/2021    2:46 PM  Fall Risk   Falls in the past year? 0 0 0 0 0  Number falls in past yr: 0 0 0 0 0  Injury with Fall? 0 0 0 0 0  Risk for fall due to : No Fall Risks No Fall Risks     Follow up Falls prevention discussed Falls prevention discussed Falls evaluation completed Falls evaluation completed Falls evaluation  completed    ***   Functional Status Survey:   ***   Assessment & Plan  *** There are no diagnoses linked to this encounter.

## 2022-04-16 NOTE — Progress Notes (Signed)
Follow up appointment for results: Megestrol response  Chief Complaint  Patient presents with   Follow-up    Blood pressure (!) 137/95, pulse (!) 102, height '5\' 5"'$  (1.651 m), weight 238 lb (108 kg).  Hemoglobin 11.5  Minimal spotting since starting megestrol  Pleased with the response  MEDS ordered this encounter: Meds ordered this encounter  Medications   megestrol (MEGACE) 40 MG tablet    Sig: 24 days on 4 days off    Dispense:  24 tablet    Refill:  11    Orders for this encounter: No orders of the defined types were placed in this encounter.   Impression + Management Plan   ICD-10-CM   1. Menorrhagia with regular cycle  N92.0    minimal bleeding on megestrol, feels much better, happy with the result    2. Dysmenorrhea  N94.6    minimal pain on megestrol    3. Symptomatic anemia  D64.9    hemoglobin 11.5 today, up from 9.8 in February    4. 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     All questions were answered.  Past Medical History:  Diagnosis Date   GERD (gastroesophageal reflux disease)    Hypertension    Hypothyroid    Hypothyroid 01/25/2018   Iron deficiency anemia due to chronic blood loss 02/16/2018    Past Surgical History:  Procedure Laterality Date   COLONOSCOPY WITH PROPOFOL N/A 09/08/2018   Procedure: COLONOSCOPY WITH PROPOFOL;  Surgeon: Lin Landsman, MD;  Location: [facility] ENDOSCOPY;  Service: Gastroenterology;  Laterality: N/A;   ESOPHAGOGASTRODUODENOSCOPY (EGD) WITH PROPOFOL N/A 09/08/2018   Procedure: ESOPHAGOGASTRODUODENOSCOPY (EGD) WITH PROPOFOL;  Surgeon: Lin Landsman, MD;  Location: [facility] ENDOSCOPY;  Service: Gastroenterology;  Laterality: N/A;    OB History     Gravida  0   Para  0   Term  0    Preterm  0   AB  0   Living  0      SAB  0   IAB  0   Ectopic  0   Multiple  0   Live Births  0           Allergies  Allergen Reactions   Celebrex [Celecoxib]     "shakes/tremors"   Sesame Seed (Diagnostic) Itching   Lisinopril Rash    Social History   Socioeconomic History   Marital status: Married    Spouse name: Curator   Number of children: 0   Years of education: Not on file   Highest education level: Associate degree: occupational, Hotel manager, or vocational program  Occupational History   Occupation: Education officer, museum: [email or web address]  Tobacco Use   Smoking status: Every Day    Packs/day: 0.75    Years: 20.00    Pack years: 15.00    Types: Cigarettes    Start date: 01/26/2000    Passive exposure: Never   Smokeless tobacco: Never  Vaping Use   Vaping Use: Never used  Substance and Sexual Activity   Alcohol use: Yes    Alcohol/week: 2.0 standard drinks    Types: 2 Cans of beer per week   Drug use: No   Sexual  activity: Yes    Birth control/protection: Other-see comments    Comment: homosexual, injection due to heavy vag. bleeding  Other Topics Concern   Not on file  Social History Narrative   She moved to [place] from [place], [place] in the Summer of 2018; She lives with her wife She is works at Librarian, academic, [employer]. wife had BKA of right leg in 2020   Social Determinants of Health   Financial Resource Strain: Not on file  Food Insecurity: Not on file  Transportation Needs: Not on file  Physical Activity: Not on file  Stress: Not on file  Social Connections: Not on file    Family History  Problem Relation Age of Onset   Hypertension Mother    Hypertension Father    Stroke Father

## 2022-04-17 ENCOUNTER — Encounter: Payer: Self-pay | Admitting: Family Medicine

## 2022-04-17 ENCOUNTER — Ambulatory Visit: Payer: Medicaid Other | Admitting: Family Medicine

## 2022-05-01 ENCOUNTER — Inpatient Hospital Stay: Payer: 59 | Admitting: Internal Medicine

## 2022-05-01 ENCOUNTER — Inpatient Hospital Stay: Payer: 59

## 2022-05-01 ENCOUNTER — Encounter: Payer: Self-pay | Admitting: Internal Medicine

## 2022-05-01 ENCOUNTER — Inpatient Hospital Stay: Payer: 59 | Attending: Internal Medicine

## 2022-05-02 ENCOUNTER — Other Ambulatory Visit: Payer: Self-pay | Admitting: Family Medicine

## 2022-05-02 DIAGNOSIS — I10 Essential (primary) hypertension: Secondary | ICD-10-CM

## 2022-05-04 ENCOUNTER — Other Ambulatory Visit: Payer: Self-pay

## 2022-05-04 DIAGNOSIS — I10 Essential (primary) hypertension: Secondary | ICD-10-CM

## 2022-05-04 MED ORDER — HYDROCHLOROTHIAZIDE 25 MG PO TABS: 25.0000 mg | ORAL_TABLET | Freq: Every day | ORAL | 0 refills | Status: DC

## 2022-05-12 ENCOUNTER — Encounter: Payer: Self-pay | Admitting: Obstetrics & Gynecology

## 2022-05-12 MED ORDER — MEGESTROL ACETATE 40 MG PO TABS: ORAL_TABLET | ORAL | 11 refills | Status: DC

## 2022-07-03 NOTE — Patient Instructions (Signed)

## 2022-07-03 NOTE — Progress Notes (Unsigned)
Name: Lynn Wilson   MRN: [id number]    DOB: 1981-08-20   Date:07/06/2022       Progress Note  Subjective  Chief Complaint  Annual Exam   HPI  Patient presents for annual CPE and leg pain  She has iron deficiency anemia, however over the past few months noticing pain on left calf, initially was at night only but now constant, and affecting her gait, no trauma, no redness, pain is aching , 7/10, worse when still.   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 [employer] from 09/26/2021 in [facility]  AUDIT-C Score 0      Depression: Phq 9 is  negative    07/06/2022    9:51 AM 04/10/2022    8:35 AM 01/20/2022    2:01 PM 09/26/2021   11:23 AM 07/16/2021    1:35 PM  Depression screen PHQ 2/9  Decreased Interest 0 0 0 0 0  Down, Depressed, Hopeless 0 0 0 0 0  PHQ - 2 Score 0 0 0 0 0  Altered sleeping 0 0 0    Tired, decreased energy 0 0 3    Change in appetite 0 0 0    Feeling bad or failure about yourself  0 0 0    Trouble concentrating 0 0 0    Moving slowly or fidgety/restless 0 0 0    Suicidal thoughts 0 0 0    PHQ-9 Score 0 0 3     Hypertension: BP Readings from Last 3 Encounters:  07/06/22 134/86  04/16/22 (!) 137/95  02/26/22 (!) 135/91   Obesity: Wt Readings from Last 3 Encounters:  07/06/22 236 lb (107 kg)  04/16/22 238 lb (108 kg)  02/26/22 238 lb (108 kg)   BMI Readings from Last 3 Encounters:  07/06/22 40.51 kg/m  04/16/22 39.61 kg/m  02/26/22 39.61 kg/m     Vaccines:   HPV: N/A Tdap: up to date Shingrix: N/A Pneumonia: refused  Flu: up to date COVID-74: discussed bivalent booster    Hep C Screening: 11/16/18 STD testing and prevention (HIV/chl/gon/syphilis): 07/12/19 Intimate partner violence: negative screen  Sexual History : female partner for the past 7 years  Menstrual History/LMP/Abnormal Bleeding:  Discussed importance of follow up if any  post-menopausal bleeding: not applicable  Incontinence Symptoms: negative for symptoms   Breast cancer:  - Last Mammogram: N/A - BRCA gene screening: N/A  Osteoporosis Prevention : Discussed high calcium and vitamin D supplementation, weight bearing exercises Bone density :not applicable   Cervical cancer screening: 02/26/22 normal   Skin cancer: Discussed monitoring for atypical lesions  Colorectal cancer: N/A   Lung cancer:  Low Dose CT Chest recommended if Age 75-80 years, 20 pack-year currently smoking OR have quit w/in 15years. Patient does not qualify for screen   ECG: 03/26/21  Advanced Care Planning: A voluntary discussion about advance care planning including the explanation and discussion of advance directives.  Discussed health care proxy and Living will, and the patient was able to identify a health care proxy as spouse.  Patient does not have a living will and power of attorney of health care   Lipids: Lab Results  Component Value Date   CHOL 191 01/20/2022   CHOL 168 07/12/2019   CHOL 160 01/26/2018   Lab Results  Component Value Date   HDL 54 01/20/2022   HDL 48 (L) 07/12/2019   HDL  48 (L) 01/26/2018   Lab Results  Component Value Date   LDLCALC 113 (H) 01/20/2022   LDLCALC 104 (H) 07/12/2019   LDLCALC 97 01/26/2018   Lab Results  Component Value Date   TRIG 127 01/20/2022   TRIG 69 07/12/2019   TRIG 68 01/26/2018   Lab Results  Component Value Date   CHOLHDL 3.5 01/20/2022   CHOLHDL 3.5 07/12/2019   CHOLHDL 3.3 01/26/2018   No results found for: "LDLDIRECT"  Glucose: Glucose, Bld  Date Value Ref Range Status  01/20/2022 113 (H) 65 - 99 mg/dL Final    Comment:    .            Fasting reference interval . For someone without known diabetes, a glucose value between 100 and 125 mg/dL is consistent with prediabetes and should be confirmed with a follow-up test. .   03/26/2021 118 (H) 70 - 99 mg/dL Final    Comment:    Glucose reference  range applies only to samples taken after fasting for at least 8 hours.  02/16/2021 108 (H) 70 - 99 mg/dL Final    Comment:    Glucose reference range applies only to samples taken after fasting for at least 8 hours.    Patient Active Problem List   Diagnosis Date Noted   Symptomatic anemia 01/30/2022   Chronic left shoulder pain 02/22/2019   Chronic gastritis without bleeding 02/22/2019   Menorrhagia with regular cycle 05/10/2018   Internal bleeding hemorrhoids 05/10/2018   Migraine without aura and responsive to treatment 02/16/2018   Iron deficiency anemia due to chronic blood loss 02/16/2018   Hyperglycemia 45/36/4680   Metabolic syndrome 32/11/2481   Hypertension 01/25/2018   GERD (gastroesophageal reflux disease) 01/25/2018   Hypothyroid 01/25/2018   Morbid obesity ([place]) 01/25/2018    Past Surgical History:  Procedure Laterality Date   COLONOSCOPY WITH PROPOFOL N/A 09/08/2018   Procedure: COLONOSCOPY WITH PROPOFOL;  Surgeon: Lin Landsman, MD;  Location: [place];  Service: Gastroenterology;  Laterality: N/A;   ESOPHAGOGASTRODUODENOSCOPY (EGD) WITH PROPOFOL N/A 09/08/2018   Procedure: ESOPHAGOGASTRODUODENOSCOPY (EGD) WITH PROPOFOL;  Surgeon: Lin Landsman, MD;  Location: [facility] ENDOSCOPY;  Service: Gastroenterology;  Laterality: N/A;    Family History  Problem Relation Age of Onset   Hypertension Mother    Hypertension Father    Stroke Father     Social History   Socioeconomic History   Marital status: Married    Spouse name: Windy Canny   Number of children: 0   Years of education: Not on file   Highest education level: Associate degree: occupational, Hotel manager, or vocational program  Occupational History   Occupation: Education officer, museum: [email or web address]  Tobacco Use   Smoking status: Every Day    Packs/day: 0.75    Years: 20.00    Total pack years: 15.00    Types: Cigarettes    Start date: 01/26/2000    Passive exposure: Never   Smokeless  tobacco: Never  Vaping Use   Vaping Use: Never used  Substance and Sexual Activity   Alcohol use: Yes    Alcohol/week: 2.0 standard drinks of alcohol    Types: 2 Cans of beer per week   Drug use: No   Sexual activity: Yes    Birth control/protection: Other-see comments    Comment: homosexual, injection due to heavy vag. bleeding  Other Topics Concern   Not on file  Social History Narrative   She moved to [place] from [place],  [place] in the Summer of 2018; She lives with her wife She is works at Librarian, academic, [employer]. wife had BKA of right leg in 2020   Social Determinants of Health   Financial Resource Strain: Low Risk  (07/06/2022)   Overall Financial Resource Strain (CARDIA)    Difficulty of Paying Living Expenses: Not hard at all  Food Insecurity: No Food Insecurity (07/06/2022)   Hunger Vital Sign    Worried About Running Out of Food in the Last Year: Never true    Ran Out of Food in the Last Year: Never true  Transportation Needs: No Transportation Needs (07/06/2022)   PRAPARE - Hydrologist (Medical): No    Lack of Transportation (Non-Medical): No  Physical Activity: Inactive (07/06/2022)   Exercise Vital Sign    Days of Exercise per Week: 0 days    Minutes of Exercise per Session: 0 min  Stress: No Stress Concern Present (07/06/2022)   [place]    Feeling of Stress : Not at all  Social Connections: Moderately Integrated (07/06/2022)   Social Connection and Isolation Panel [NHANES]    Frequency of Communication with Friends and Family: More than three times a week    Frequency of Social Gatherings with Friends and Family: Once a week    Attends Religious Services: More than 4 times per year    Active Member of [employer] or Organizations: No    Attends Archivist Meetings: Never    Marital Status: Married  Human resources officer Violence: Not At Risk (07/06/2022)    Humiliation, Afraid, Rape, and Kick questionnaire    Fear of Current or Ex-Partner: No    Emotionally Abused: No    Physically Abused: No    Sexually Abused: No     Current Outpatient Medications:    amLODipine (NORVASC) 5 MG tablet, Take 1 tablet (5 mg total) by mouth daily., Disp: 90 tablet, Rfl: 0   baclofen (LIORESAL) 10 MG tablet, Take 1 tablet (10 mg total) by mouth 3 (three) times daily. For headaches, Disp: 30 each, Rfl: 0   buPROPion (WELLBUTRIN XL) 150 MG 24 hr tablet, Take 1 tablet (150 mg total) by mouth in the morning., Disp: 90 tablet, Rfl: 0   dicyclomine (BENTYL) 20 MG tablet, Take 1 tablet (20 mg total) by mouth 4 (four) times daily -  before meals and at bedtime., Disp: 40 tablet, Rfl: 0   hydrochlorothiazide (HYDRODIURIL) 25 MG tablet, Take 1 tablet (25 mg total) by mouth daily., Disp: 90 tablet, Rfl: 0   Iron, Ferrous Sulfate, 325 (65 Fe) MG TABS, Take 325 mg by mouth daily., Disp: 180 tablet, Rfl: 0   levothyroxine (SYNTHROID) 100 MCG tablet, Take 1 tablet (100 mcg total) by mouth every morning., Disp: 90 tablet, Rfl: 1   megestrol (MEGACE) 40 MG tablet, 3 tablets a day for 5 days, 2 tablets a day for 5 days then 1 tablet daily, Disp: 45 tablet, Rfl: 11   pantoprazole (PROTONIX) 40 MG tablet, Take 1 tablet (40 mg total) by mouth daily., Disp: 90 tablet, Rfl: 1  Allergies  Allergen Reactions   Celecoxib     "shakes/tremors" Other reaction(s): other   Sesame Seed (Diagnostic) Itching   Lisinopril Rash     ROS  Constitutional: Negative for fever or weight change.  Respiratory: Negative for cough and shortness of breath.   Cardiovascular: Negative for chest pain but had  palpitations about one month  ago .  Gastrointestinal: Negative for abdominal pain, no bowel changes.  Musculoskeletal: positive  for gait problem but no  joint swelling.  Skin: Negative for rash.  Neurological: Negative for dizziness , positive for intermittent  headache.  No other specific  complaints in a complete review of systems (except as listed in HPI above).   Objective  Vitals:   07/06/22 0951  BP: 134/86  Pulse: 92  Resp: 16  SpO2: 97%  Weight: 236 lb (107 kg)  Height: '5\' 4"'  (1.626 m)    Body mass index is 40.51 kg/m.  Physical Exam  Constitutional: Patient appears well-developed and well-nourished. No distress.  HENT: Head: Normocephalic and atraumatic. Ears: B TMs ok, no erythema or effusion; Nose: Nose normal. Mouth/Throat: Oropharynx is clear and moist. No oropharyngeal exudate.  Eyes: Conjunctivae and EOM are normal. Pupils are equal, round, and reactive to light. No scleral icterus.  Neck: Normal range of motion. Neck supple. No JVD present. No thyromegaly present.  Cardiovascular: Normal rate, regular rhythm and normal heart sounds.  No murmur heard. No BLE edema. Left calf pain , worse with compression and feels tight  Pulmonary/Chest: Effort normal and breath sounds normal. No respiratory distress. Abdominal: Soft. Bowel sounds are normal, no distension. There is no tenderness. no masses Breast: no lumps or masses, no nipple discharge or rashes FEMALE GENITALIA:  Not done, seen by gyn recently  RECTAL: not done  Musculoskeletal: Normal range of motion, no joint effusions. No gross deformities Neurological: he is alert and oriented to person, place, and time. No cranial nerve deficit. Coordination, balance, strength, speech and gait are normal.  Skin: Skin is warm and dry. No rash noted. No erythema. She has some acne and milia on face  Psychiatric: Patient has a normal mood and affect. behavior is normal. Judgment and thought content normal.   Recent Results (from the past 2160 hour(s))  POCT hemoglobin     Status: None   Collection Time: 04/16/22  5:03 PM  Result Value Ref Range   Hemoglobin 11.5 11 - 14.6 g/dL     Fall Risk:    07/06/2022    9:51 AM 04/10/2022    8:35 AM 01/20/2022    2:00 PM 09/26/2021   11:22 AM 07/16/2021    1:34 PM   Fall Risk   Falls in the past year? 0 0 0 0 0  Number falls in past yr: 0 0 0 0 0  Injury with Fall? 0 0 0 0 0  Risk for fall due to : No Fall Risks No Fall Risks No Fall Risks    Follow up Falls prevention discussed Falls prevention discussed Falls prevention discussed Falls evaluation completed Falls evaluation completed     Functional Status Survey: Is the patient deaf or have difficulty hearing?: Yes Does the patient have difficulty seeing, even when wearing glasses/contacts?: No Does the patient have difficulty concentrating, remembering, or making decisions?: No Does the patient have difficulty walking or climbing stairs?: Yes Does the patient have difficulty dressing or bathing?: No Does the patient have difficulty doing errands alone such as visiting a doctor's office or shopping?: No   Assessment & Plan  1. Well adult exam   2. Pre-diabetes  - Hemoglobin A1c  3. Iron deficiency anemia due to chronic blood loss  - CBC with Differential/Platelet - Iron, TIBC and Ferritin Panel  4. Hypothyroidism, adult  - TSH   5. Pain of left calf  - VAS [place] LOWER EXTREMITY VENOUS (DVT);  Future if negative we will refer her to ortho   -USPSTF grade A and B recommendations reviewed with patient; age-appropriate recommendations, preventive care, screening tests, etc discussed and encouraged; healthy living encouraged; see AVS for patient education given to patient -Discussed importance of 150 minutes of physical activity weekly, eat two servings of fish weekly, eat one serving of tree nuts ( cashews, pistachios, pecans, almonds.Marland Kitchen) every other day, eat 6 servings of fruit/vegetables daily and drink plenty of water and avoid sweet beverages.   -Reviewed Health Maintenance: Yes.

## 2022-07-06 ENCOUNTER — Ambulatory Visit (INDEPENDENT_AMBULATORY_CARE_PROVIDER_SITE_OTHER): Payer: Commercial Managed Care - HMO | Admitting: Family Medicine

## 2022-07-06 ENCOUNTER — Ambulatory Visit
Admission: RE | Admit: 2022-07-06 | Discharge: 2022-07-06 | Disposition: A | Discharge status: Home / Self Care | Payer: Commercial Managed Care - HMO | Source: Ambulatory Visit | Attending: Family Medicine | Admitting: Family Medicine

## 2022-07-06 ENCOUNTER — Encounter: Payer: Self-pay | Admitting: Internal Medicine

## 2022-07-06 ENCOUNTER — Other Ambulatory Visit: Payer: Self-pay

## 2022-07-06 ENCOUNTER — Ambulatory Visit: Payer: Self-pay | Admitting: *Deleted

## 2022-07-06 ENCOUNTER — Other Ambulatory Visit: Payer: Self-pay | Admitting: Family Medicine

## 2022-07-06 ENCOUNTER — Encounter: Payer: Self-pay | Admitting: Family Medicine

## 2022-07-06 VITALS — BP 134/86 | HR 92 | Resp 16 | Ht 64.0 in | Wt 236.0 lb

## 2022-07-06 DIAGNOSIS — R7303 Prediabetes: Secondary | ICD-10-CM | POA: Diagnosis not present

## 2022-07-06 DIAGNOSIS — M79662 Pain in left lower leg: Secondary | ICD-10-CM

## 2022-07-06 DIAGNOSIS — D500 Iron deficiency anemia secondary to blood loss (chronic): Secondary | ICD-10-CM

## 2022-07-06 DIAGNOSIS — E039 Hypothyroidism, unspecified: Secondary | ICD-10-CM | POA: Diagnosis not present

## 2022-07-06 DIAGNOSIS — M79604 Pain in right leg: Secondary | ICD-10-CM

## 2022-07-06 DIAGNOSIS — Z0000 Encounter for general adult medical examination without abnormal findings: Secondary | ICD-10-CM

## 2022-07-06 NOTE — Telephone Encounter (Signed)
Call received by Jerline Pain from [facility] regarding ultrasound. Left leg doppler read by J. Pascal Lux, MD results negative for DVT.

## 2022-07-07 LAB — CBC WITH DIFFERENTIAL/PLATELET
Absolute Monocytes: 485 cells/uL (ref 200–950)
Basophils Absolute: 23 cells/uL (ref 0–200)
Basophils Relative: 0.3 %
Eosinophils Absolute: 108 cells/uL (ref 15–500)
Eosinophils Relative: 1.4 %
HCT: 40.7 % (ref 35.0–45.0)
Hemoglobin: 13.5 g/dL (ref 11.7–15.5)
Lymphs Abs: 2472 cells/uL (ref 850–3900)
MCH: 27.1 pg (ref 27.0–33.0)
MCHC: 33.2 g/dL (ref 32.0–36.0)
MCV: 81.6 fL (ref 80.0–100.0)
MPV: 9.7 fL (ref 7.5–12.5)
Monocytes Relative: 6.3 %
Neutro Abs: 4612 cells/uL (ref 1500–7800)
Neutrophils Relative %: 59.9 %
Platelets: 360 10*3/uL (ref 140–400)
RBC: 4.99 10*6/uL (ref 3.80–5.10)
RDW: 14.7 % (ref 11.0–15.0)
Total Lymphocyte: 32.1 %
WBC: 7.7 10*3/uL (ref 3.8–10.8)

## 2022-07-07 LAB — HEMOGLOBIN A1C
Hgb A1c MFr Bld: 5.9 % of total Hgb — ABNORMAL HIGH (ref <5.7)
Mean Plasma Glucose: 123 mg/dL
eAG (mmol/L): 6.8 mmol/L

## 2022-07-07 LAB — TSH: TSH: 2.67 mIU/L

## 2022-07-07 LAB — IRON,TIBC AND FERRITIN PANEL
%SAT: 15 % (calc) — ABNORMAL LOW (ref 16–45)
Ferritin: 21 ng/mL (ref 16–154)
Iron: 62 ug/dL (ref 40–190)
TIBC: 427 mcg/dL (calc) (ref 250–450)

## 2022-07-16 ENCOUNTER — Encounter: Payer: Self-pay | Admitting: Obstetrics & Gynecology

## 2022-07-20 MED ORDER — MEGESTROL ACETATE 40 MG PO TABS: ORAL_TABLET | ORAL | 3 refills | Status: DC

## 2022-07-20 NOTE — Addendum Note (Signed)
Addended by: Florian Buff on: 07/20/2022 04:04 PM   Modules accepted: Orders

## 2022-07-28 ENCOUNTER — Ambulatory Visit: Payer: Commercial Managed Care - HMO | Admitting: Orthopedic Surgery

## 2022-07-29 ENCOUNTER — Ambulatory Visit: Payer: Commercial Managed Care - HMO | Admitting: Orthopedic Surgery

## 2022-08-04 ENCOUNTER — Other Ambulatory Visit: Payer: Self-pay | Admitting: Family Medicine

## 2022-08-04 DIAGNOSIS — I10 Essential (primary) hypertension: Secondary | ICD-10-CM

## 2022-08-05 ENCOUNTER — Ambulatory Visit: Payer: Commercial Managed Care - HMO | Admitting: Orthopedic Surgery

## 2022-08-05 MED ORDER — HYDROCHLOROTHIAZIDE 25 MG PO TABS: 25.0000 mg | ORAL_TABLET | Freq: Every day | ORAL | 0 refills | Status: DC

## 2022-08-12 ENCOUNTER — Ambulatory Visit (INDEPENDENT_AMBULATORY_CARE_PROVIDER_SITE_OTHER): Payer: Commercial Managed Care - HMO

## 2022-08-12 ENCOUNTER — Ambulatory Visit (INDEPENDENT_AMBULATORY_CARE_PROVIDER_SITE_OTHER): Payer: Commercial Managed Care - HMO | Admitting: Orthopedic Surgery

## 2022-08-12 VITALS — Ht 60.0 in | Wt 234.0 lb

## 2022-08-12 DIAGNOSIS — G8929 Other chronic pain: Secondary | ICD-10-CM | POA: Diagnosis not present

## 2022-08-12 DIAGNOSIS — M25561 Pain in right knee: Secondary | ICD-10-CM

## 2022-08-12 DIAGNOSIS — Z6841 Body Mass Index (BMI) 40.0 and over, adult: Secondary | ICD-10-CM | POA: Diagnosis not present

## 2022-08-12 MED ORDER — PREDNISONE 10 MG (21) PO TBPK: ORAL_TABLET | ORAL | 0 refills | Status: DC

## 2022-08-12 NOTE — Progress Notes (Deleted)
Name: Lynn Wilson   MRN: [id number]    DOB: February 06, 1981   Date:08/12/2022       Progress Note  Subjective  Chief Complaint  Follow Up  HPI  Migraine: she states only had a couple of episodes in 2020, she takes muscle relaxer prn for migraine , she states if she avoids chocolate, a lot of sweets symptoms are controlled. She states baclofen works well for her. She asked to see neurologist since she has noticed a new type of headache, nuchal area, usually in the morning dull but can shoot down her spine and it causes her to stop what she is doing do to intensity. Not associated with weakness, nausea or vomiting. She is having pica , we will check labs. Since she also has a history of migraines we will refer her to neurologist    HTN: she is back on HCTZ 25 mg  and norvasc 5 mg, bp today is at goal. She has intermittent chest pain and has gone to [facility] in the past, usually triggered by stress. She was supposed to follow up with cardiologist, referred by [facility] in the past  Advised to correct iron and if symptoms than we can refer her to cardiologist    Smoker's cough : Smoking 1 pack daily. She has episodes of bronchitis , she has also noticed a cough that is productive in am's about three times a week, discussed importance of quitting. She is willing to try Wellbutrin    Hypothyroidism: she is compliant with thyroid medication. No change in bowel movements she has chronic dry skin, no dysphagia. We will recheck labs    Iron deficiency anemia: regular but heavy cycles even clots,  used to get iron infusion, we placed her on Depo in January 22 however she stopped last fall and started having cycles again two months ago and they are worse, heavier, still with clots but now has severe cramping.  She also has hemorrhoids without rectal bleeding, chronic gastritis per EGD, seen by Dr. Marius Ditch , she has been taking pantoprazole twice daily, but advised her to try going down to once a day She has noticed pica  lately.  Over the past few months noticing pain on left calf, initially was at night only but now constant, and affecting her gait, no trauma, no redness, pain is aching , 7/10, worse when still.   Pre-diabetes/Metabolic Syndrome: she has episodes of polyphagia, but denies polydipsia or polyuria   Dysmenorrhea secondary and past couple of months very heavy cycles with clots: she was on depo for a few months to control periods but she continue to have break through bleeding and she felt worse. She states she never had cramps like she is now    Morbid obesity: BMI above 35 with co-morbidities such as Pre-diabetes , HTN. Discussed importance of following a healthy diet   Patient Active Problem List   Diagnosis Date Noted   Symptomatic anemia 01/30/2022   Chronic left shoulder pain 02/22/2019   Chronic gastritis without bleeding 02/22/2019   Menorrhagia with regular cycle 05/10/2018   Internal bleeding hemorrhoids 05/10/2018   Migraine without aura and responsive to treatment 02/16/2018   Iron deficiency anemia due to chronic blood loss 02/16/2018   Hyperglycemia 76/28/3151   Metabolic syndrome 76/16/0737   Hypertension 01/25/2018   GERD (gastroesophageal reflux disease) 01/25/2018   Hypothyroid 01/25/2018   Morbid obesity ([place]) 01/25/2018    Past Surgical History:  Procedure Laterality Date   COLONOSCOPY WITH PROPOFOL N/A  09/08/2018   Procedure: COLONOSCOPY WITH PROPOFOL;  Surgeon: Lin Landsman, MD;  Location: [facility] ENDOSCOPY;  Service: Gastroenterology;  Laterality: N/A;   ESOPHAGOGASTRODUODENOSCOPY (EGD) WITH PROPOFOL N/A 09/08/2018   Procedure: ESOPHAGOGASTRODUODENOSCOPY (EGD) WITH PROPOFOL;  Surgeon: Lin Landsman, MD;  Location: [facility] ENDOSCOPY;  Service: Gastroenterology;  Laterality: N/A;    Family History  Problem Relation Age of Onset   Hypertension Mother    Hypertension Father    Stroke Father     Social History   Tobacco Use   Smoking status: Every Day     Packs/day: 0.75    Years: 20.00    Total pack years: 15.00    Types: Cigarettes    Start date: 01/26/2000    Passive exposure: Never   Smokeless tobacco: Never  Substance Use Topics   Alcohol use: Yes    Alcohol/week: 2.0 standard drinks of alcohol    Types: 2 Cans of beer per week     Current Outpatient Medications:    amLODipine (NORVASC) 5 MG tablet, Take 1 tablet (5 mg total) by mouth daily., Disp: 90 tablet, Rfl: 0   baclofen (LIORESAL) 10 MG tablet, Take 1 tablet (10 mg total) by mouth 3 (three) times daily. For headaches, Disp: 30 each, Rfl: 0   buPROPion (WELLBUTRIN XL) 150 MG 24 hr tablet, Take 1 tablet (150 mg total) by mouth in the morning., Disp: 90 tablet, Rfl: 0   dicyclomine (BENTYL) 20 MG tablet, Take 1 tablet (20 mg total) by mouth 4 (four) times daily -  before meals and at bedtime., Disp: 40 tablet, Rfl: 0   hydrochlorothiazide (HYDRODIURIL) 25 MG tablet, Take 1 tablet (25 mg total) by mouth daily., Disp: 30 tablet, Rfl: 0   Iron, Ferrous Sulfate, 325 (65 Fe) MG TABS, Take 325 mg by mouth daily., Disp: 180 tablet, Rfl: 0   levothyroxine (SYNTHROID) 100 MCG tablet, Take 1 tablet (100 mcg total) by mouth every morning., Disp: 90 tablet, Rfl: 1   megestrol (MEGACE) 40 MG tablet, 3 tablets a day for 5 days, 2 tablets a day for 5 days then 1 tablet daily, Disp: 45 tablet, Rfl: 11   megestrol (MEGACE) 40 MG tablet, 3 tablets a day for 5 days, 2 tablets a day for 5 days then 1 tablet daily, Disp: 45 tablet, Rfl: 3   pantoprazole (PROTONIX) 40 MG tablet, Take 1 tablet (40 mg total) by mouth daily., Disp: 90 tablet, Rfl: 1   predniSONE (STERAPRED UNI-PAK 21 TAB) 10 MG (21) TBPK tablet, 10 mg DS 12 as directed, Disp: 48 tablet, Rfl: 0  Allergies  Allergen Reactions   Celecoxib     "shakes/tremors" Other reaction(s): other   Sesame Seed (Diagnostic) Itching   Lisinopril Rash    I personally reviewed active problem list, medication list, allergies, family history, social  history, health maintenance with the patient/caregiver today.   ROS  ***  Objective  There were no vitals filed for this visit.  There is no height or weight on file to calculate BMI.  Physical Exam ***  Recent Results (from the past 2160 hour(s))  CBC with Differential/Platelet     Status: None   Collection Time: 07/06/22 10:32 AM  Result Value Ref Range   WBC 7.7 3.8 - 10.8 Thousand/uL   RBC 4.99 3.80 - 5.10 Million/uL   Hemoglobin 13.5 11.7 - 15.5 g/dL   HCT 40.7 35.0 - 45.0 %   MCV 81.6 80.0 - 100.0 fL   MCH 27.1 27.0 -  33.0 pg   MCHC 33.2 32.0 - 36.0 g/dL   RDW 14.7 11.0 - 15.0 %   Platelets 360 140 - 400 Thousand/uL   MPV 9.7 7.5 - 12.5 fL   Neutro Abs 4,612 1,500 - 7,800 cells/uL   Lymphs Abs 2,472 850 - 3,900 cells/uL   Absolute Monocytes 485 200 - 950 cells/uL   Eosinophils Absolute 108 15 - 500 cells/uL   Basophils Absolute 23 0 - 200 cells/uL   Neutrophils Relative % 59.9 %   Total Lymphocyte 32.1 %   Monocytes Relative 6.3 %   Eosinophils Relative 1.4 %   Basophils Relative 0.3 %  Iron, TIBC and Ferritin Panel     Status: Abnormal   Collection Time: 07/06/22 10:32 AM  Result Value Ref Range   Iron 62 40 - 190 mcg/dL   TIBC 427 250 - 450 mcg/dL (calc)   %SAT 15 (L) 16 - 45 % (calc)   Ferritin 21 16 - 154 ng/mL  TSH     Status: None   Collection Time: 07/06/22 10:32 AM  Result Value Ref Range   TSH 2.67 mIU/L    Comment:           Reference Range .           > or = 20 Years  0.40-4.50 .                Pregnancy Ranges           First trimester    0.26-2.66           Second trimester   0.55-2.73           Third trimester    0.43-2.91   Hemoglobin A1c     Status: Abnormal   Collection Time: 07/06/22 10:32 AM  Result Value Ref Range   Hgb A1c MFr Bld 5.9 (H) <5.7 % of total Hgb    Comment: For someone without known diabetes, a hemoglobin  A1c value between 5.7% and 6.4% is consistent with prediabetes and should be confirmed with a  follow-up  test. . For someone with known diabetes, a value <7% indicates that their diabetes is well controlled. A1c targets should be individualized based on duration of diabetes, age, comorbid conditions, and other considerations. . This assay result is consistent with an increased risk of diabetes. . Currently, no consensus exists regarding use of hemoglobin A1c for diagnosis of diabetes for children. .    Mean Plasma Glucose 123 mg/dL   eAG (mmol/L) 6.8 mmol/L    PHQ2/9:    07/06/2022    9:51 AM 04/10/2022    8:35 AM 01/20/2022    2:01 PM 09/26/2021   11:23 AM 07/16/2021    1:35 PM  Depression screen PHQ 2/9  Decreased Interest 0 0 0 0 0  Down, Depressed, Hopeless 0 0 0 0 0  PHQ - 2 Score 0 0 0 0 0  Altered sleeping 0 0 0    Tired, decreased energy 0 0 3    Change in appetite 0 0 0    Feeling bad or failure about yourself  0 0 0    Trouble concentrating 0 0 0    Moving slowly or fidgety/restless 0 0 0    Suicidal thoughts 0 0 0    PHQ-9 Score 0 0 3      phq 9 is {gen pos [id number]}   Fall Risk:    07/06/2022    9:51 AM 04/10/2022  8:35 AM 01/20/2022    2:00 PM 09/26/2021   11:22 AM 07/16/2021    1:34 PM  Fall Risk   Falls in the past year? 0 0 0 0 0  Number falls in past yr: 0 0 0 0 0  Injury with Fall? 0 0 0 0 0  Risk for fall due to : No Fall Risks No Fall Risks No Fall Risks    Follow up Falls prevention discussed Falls prevention discussed Falls prevention discussed Falls evaluation completed Falls evaluation completed      Functional Status Survey:      Assessment & Plan  *** There are no diagnoses linked to this encounter.

## 2022-08-13 ENCOUNTER — Encounter: Payer: Self-pay | Admitting: Orthopedic Surgery

## 2022-08-13 ENCOUNTER — Ambulatory Visit: Payer: PRIVATE HEALTH INSURANCE | Admitting: Family Medicine

## 2022-08-13 NOTE — Progress Notes (Signed)
New Patient Visit  Assessment: Lynn Wilson is a 41 y.o. female with the following: 1. Chronic pain of right knee  Plan: Lynn Wilson has pain in her right knee.  No specific injury.  She has tried NSAIDs, with limited improvement in her symptoms.  Radiographs look good.  Do not think it is appropriate to proceed with an injection.  Provided her with a Dosepak of prednisone.  We will refer her to PT.  Follow-up as needed.  The patient meets the AMA guidelines for Morbid obesity with BMI > 40.  The patient has been counseled on weight loss.    Follow-up: Return if symptoms worsen or fail to improve.  Subjective:  Chief Complaint  Patient presents with   Knee Pain    Rt knee pain for 2 mos    History of Present Illness: Lynn Wilson is a 41 y.o. female who presents for evaluation of right knee pain.  She has been having pain in the right knee for the past 2 months.  No specific injury.  She has tried NSAIDs, with limited improvement in her symptoms.  Pain is getting better.  She has not worked with physical therapy.  No history of injuries to her right knee.   Review of Systems: No fevers or chills No numbness or tingling No chest pain No shortness of breath No bowel or bladder dysfunction No GI distress No headaches   Medical History:  Past Medical History:  Diagnosis Date   GERD (gastroesophageal reflux disease)    Hypertension    Hypothyroid    Hypothyroid 01/25/2018   Iron deficiency anemia due to chronic blood loss 02/16/2018    Past Surgical History:  Procedure Laterality Date   COLONOSCOPY WITH PROPOFOL N/A 09/08/2018   Procedure: COLONOSCOPY WITH PROPOFOL;  Surgeon: Lin Landsman, MD;  Location: [facility] ENDOSCOPY;  Service: Gastroenterology;  Laterality: N/A;   ESOPHAGOGASTRODUODENOSCOPY (EGD) WITH PROPOFOL N/A 09/08/2018   Procedure: ESOPHAGOGASTRODUODENOSCOPY (EGD) WITH PROPOFOL;  Surgeon: Lin Landsman, MD;  Location: [facility] ENDOSCOPY;   Service: Gastroenterology;  Laterality: N/A;    Family History  Problem Relation Age of Onset   Hypertension Mother    Hypertension Father    Stroke Father    Social History   Tobacco Use   Smoking status: Every Day    Packs/day: 0.75    Years: 20.00    Total pack years: 15.00    Types: Cigarettes    Start date: 01/26/2000    Passive exposure: Never   Smokeless tobacco: Never  Vaping Use   Vaping Use: Never used  Substance Use Topics   Alcohol use: Yes    Alcohol/week: 2.0 standard drinks of alcohol    Types: 2 Cans of beer per week   Drug use: No    Allergies  Allergen Reactions   Celecoxib     "shakes/tremors" Other reaction(s): other   Sesame Seed (Diagnostic) Itching   Lisinopril Rash    Current Meds  Medication Sig   predniSONE (STERAPRED UNI-PAK 21 TAB) 10 MG (21) TBPK tablet 10 mg DS 12 as directed    Objective: Ht 5' (1.524 m)   Wt 234 lb (106.1 kg)   BMI 45.70 kg/m   Physical Exam:  General: Alert and oriented. and No acute distress. Gait: Normal gait.  Right knee without swelling.  No bruising.  No deformity.  Full range of motion.  She has good strength.  No increased laxity varus valgus stress.  Negative Lachman.  Is warm and well-perfused.  IMAGING: I personally ordered and reviewed the following images  X-rays of the right knee were obtained in clinic today.  No acute injuries are noted.  Neutral overall alignment.  Well-maintained joint space.  Mild bone spurring.  Patella is appropriately positioned within the trochlear groove.  Impression: Negative right knee x-ray   New Medications:  Meds ordered this encounter  Medications   predniSONE (STERAPRED UNI-PAK 21 TAB) 10 MG (21) TBPK tablet    Sig: 10 mg DS 12 as directed    Dispense:  48 tablet    Refill:  0      Mordecai Rasmussen, MD  08/13/2022 1:28 PM

## 2022-09-05 ENCOUNTER — Other Ambulatory Visit: Payer: Self-pay | Admitting: Family Medicine

## 2022-09-05 ENCOUNTER — Other Ambulatory Visit: Payer: Self-pay | Admitting: Nurse Practitioner

## 2022-09-05 DIAGNOSIS — I10 Essential (primary) hypertension: Secondary | ICD-10-CM

## 2022-09-05 DIAGNOSIS — E039 Hypothyroidism, unspecified: Secondary | ICD-10-CM

## 2022-09-07 NOTE — Telephone Encounter (Signed)
Requested Prescriptions  Pending Prescriptions Disp Refills  . levothyroxine (SYNTHROID) 100 MCG tablet [Pharmacy Med Name: Levothyroxine Sodium 100 MCG Oral Tablet] 90 tablet 0    Sig: TAKE 1 TABLET BY MOUTH IN THE MORNING     Endocrinology:  Hypothyroid Agents Passed - 09/05/2022 12:58 PM      Passed - TSH in normal range and within 360 days    TSH  Date Value Ref Range Status  07/06/2022 2.67 mIU/L Final    Comment:              Reference Range .           > or = 20 Years  0.40-4.50 .                Pregnancy Ranges           First trimester    0.26-2.66           Second trimester   0.55-2.73           Third trimester    0.43-2.91          Passed - Valid encounter within last 12 months    Recent Outpatient Visits          2 months ago Well adult exam   [facility] Steele Sizer, MD   5 months ago Upper respiratory tract infection, unspecified type   [place], PA-C   7 months ago Essential hypertension   [facility] Steele Sizer, MD   11 months ago Essential hypertension   [facility] Bo Merino, FNP   1 year ago COVID-19   [place], [place]

## 2022-09-10 ENCOUNTER — Other Ambulatory Visit: Payer: Self-pay | Admitting: Family Medicine

## 2022-09-10 DIAGNOSIS — I10 Essential (primary) hypertension: Secondary | ICD-10-CM

## 2022-09-16 ENCOUNTER — Other Ambulatory Visit: Payer: Self-pay | Admitting: Family Medicine

## 2022-09-16 DIAGNOSIS — I10 Essential (primary) hypertension: Secondary | ICD-10-CM

## 2022-09-17 ENCOUNTER — Ambulatory Visit: Payer: PRIVATE HEALTH INSURANCE | Admitting: Internal Medicine

## 2022-09-17 NOTE — Progress Notes (Deleted)
   Acute Office Visit  Subjective:     Patient ID: Lynn Wilson, female    DOB: 11-26-1981, 41 y.o.   MRN: [id number]  No chief complaint on file.   HPI Patient is in today for chest pain and headaches.  CHEST PAIN Time since onset: Duration:{Blank single:19197::"days","weeks","months"} Onset: {Blank single:19197::"sudden","gradual"} Quality: {Blank multiple:19196::"sharp","dull","aching","burning","cramping","ill-defined","itchy","pressure-like","pulling","shooting","sore","stabbing","tender","tearing","throbbing"} Severity: {Blank single:19197::"mild","moderate","severe","1/10","2/10","3/10","4/10","5/10","6/10","7/10","8/10","9/10","10/10"} Location: {Blank single:19197::"substernal","left para substernal","right para substernal","subxiphoid","upper right","upper left","upper sternum"} Radiation: {Blank single:19197::"none","neck","jaw","left arm","right arm","back"} Episode duration:  Frequency: {Blank single:19197::"constant","intermittent","occasional","rare","every few minutes","a few times a hour","a few times a day","a few times a week","a few times a month","a few times a year"} Related to exertion: {Blank single:19197::"yes","no"} Activity when pain started:  Trauma: {Blank single:19197::"yes","no"} Anxiety/recent stressors: {Blank single:19197::"yes","no"} Aggravating factors:  Alleviating factors:  Status: {Blank multiple:19196::"better","worse","stable","fluctuating"} Treatments attempted: {Blank multiple:19196::"nothing","APAP","ibuprofen","antacids"}  Current pain status: {Blank single:19197::"pain free","in pain","chest wall tender"} Shortness of breath: {Blank single:19197::"yes","no"} Cough: {Blank single:19197::"yes","no","yes, productive","yes, non-productive"} Nausea: {Blank single:19197::"yes","no"} Diaphoresis: {Blank single:19197::"yes","no"} Heartburn: {Blank single:19197::"yes","no"} Palpitations: {Blank  single:19197::"yes","no"}  Headaches: Duration: {Blank single:19197::"chronic","days","weeks","months","years"} Onset: {Blank single:19197::"sudden","gradual"} Severity: {Blank single:19197::"mild","moderate","severe","1/10","2/10","3/10","4/10","5/10","6/10","7/10","8/10","9/10","10/10"} Quality: {Blank multiple:19196::"sharp","dull","aching","burning","cramping","ill-defined","itchy","pressure-like","pulling","shooting","sore","stabbing","tender","tearing","throbbing"} Frequency: {Blank single:19197::"constant","intermittent","occasional","rare","every few minutes","a few times a hour","a few times a day","a few times a week","a few times a month","a few times a year"} Location:  Headache duration: Radiation: {Blank single:19197::"yes","no"} Time of day headache occurs:  Alleviating factors:  Aggravating factors:  Headache status at time of visit: {Blank single:19197::"current headache","asymptomatic"} Treatments attempted: Treatments attempted: {Blank multiple:19196::"none","rest","ice","heat","APAP","ibuprofen","aleve", excedrine","triptans","propranolol","topamax","amitriptyline"}   Aura: {Blank single:19197::"yes","no"} Nausea:  {Blank single:19197::"yes","no"} Vomiting: {Blank single:19197::"yes","no"} Photophobia:  {Blank single:19197::"yes","no"} Phonophobia:  {Blank single:19197::"yes","no"} Effect on social functioning:  {Blank single:19197::"yes","no"} Numbers of missed days of school/work each month:  Confusion:  {Blank single:19197::"yes","no"} Gait disturbance/ataxia:  {Blank single:19197::"yes","no"} Behavioral changes:  {Blank single:19197::"yes","no"} Fevers:  {Blank single:19197::"yes","no"}   ROS      Objective:    There were no vitals taken for this visit. {Vitals History (Optional):23777}  Physical Exam  No results found for any visits on 09/17/22.      Assessment & Plan:   Problem List Items Addressed This Visit   None   No orders of the  defined types were placed in this encounter.   No follow-ups on file.  Teodora Medici, DO

## 2022-09-21 NOTE — Progress Notes (Deleted)
Name: Lynn Wilson   MRN: [id number]    DOB: Jul 21, 1981   Date:09/21/2022       Progress Note  Subjective  Chief Complaint  Headache/ Chest Pains  HPI  *** Patient Active Problem List   Diagnosis Date Noted   Symptomatic anemia 01/30/2022   Chronic left shoulder pain 02/22/2019   Chronic gastritis without bleeding 02/22/2019   Menorrhagia with regular cycle 05/10/2018   Internal bleeding hemorrhoids 05/10/2018   Migraine without aura and responsive to treatment 02/16/2018   Iron deficiency anemia due to chronic blood loss 02/16/2018   Hyperglycemia 50/93/2671   Metabolic syndrome 24/58/0998   Hypertension 01/25/2018   GERD (gastroesophageal reflux disease) 01/25/2018   Hypothyroid 01/25/2018   Morbid obesity ([place]) 01/25/2018    Past Surgical History:  Procedure Laterality Date   COLONOSCOPY WITH PROPOFOL N/A 09/08/2018   Procedure: COLONOSCOPY WITH PROPOFOL;  Surgeon: Lin Landsman, MD;  Location: [place];  Service: Gastroenterology;  Laterality: N/A;   ESOPHAGOGASTRODUODENOSCOPY (EGD) WITH PROPOFOL N/A 09/08/2018   Procedure: ESOPHAGOGASTRODUODENOSCOPY (EGD) WITH PROPOFOL;  Surgeon: Lin Landsman, MD;  Location: [facility] ENDOSCOPY;  Service: Gastroenterology;  Laterality: N/A;    Family History  Problem Relation Age of Onset   Hypertension Mother    Hypertension Father    Stroke Father     Social History   Tobacco Use   Smoking status: Every Day    Packs/day: 0.75    Years: 20.00    Total pack years: 15.00    Types: Cigarettes    Start date: 01/26/2000    Passive exposure: Never   Smokeless tobacco: Never  Substance Use Topics   Alcohol use: Yes    Alcohol/week: 2.0 standard drinks of alcohol    Types: 2 Cans of beer per week     Current Outpatient Medications:    amLODipine (NORVASC) 5 MG tablet, Take 1 tablet (5 mg total) by mouth daily., Disp: 90 tablet, Rfl: 0   baclofen (LIORESAL) 10 MG tablet, Take 1 tablet (10 mg total) by mouth  3 (three) times daily. For headaches, Disp: 30 each, Rfl: 0   buPROPion (WELLBUTRIN XL) 150 MG 24 hr tablet, Take 1 tablet (150 mg total) by mouth in the morning., Disp: 90 tablet, Rfl: 0   dicyclomine (BENTYL) 20 MG tablet, Take 1 tablet (20 mg total) by mouth 4 (four) times daily -  before meals and at bedtime., Disp: 40 tablet, Rfl: 0   hydrochlorothiazide (HYDRODIURIL) 25 MG tablet, Take 1 tablet (25 mg total) by mouth daily., Disp: 30 tablet, Rfl: 0   Iron, Ferrous Sulfate, 325 (65 Fe) MG TABS, Take 325 mg by mouth daily., Disp: 180 tablet, Rfl: 0   levothyroxine (SYNTHROID) 100 MCG tablet, TAKE 1 TABLET BY MOUTH IN THE MORNING, Disp: 90 tablet, Rfl: 0   megestrol (MEGACE) 40 MG tablet, 3 tablets a day for 5 days, 2 tablets a day for 5 days then 1 tablet daily, Disp: 45 tablet, Rfl: 11   megestrol (MEGACE) 40 MG tablet, 3 tablets a day for 5 days, 2 tablets a day for 5 days then 1 tablet daily, Disp: 45 tablet, Rfl: 3   pantoprazole (PROTONIX) 40 MG tablet, Take 1 tablet (40 mg total) by mouth daily., Disp: 90 tablet, Rfl: 1   predniSONE (STERAPRED UNI-PAK 21 TAB) 10 MG (21) TBPK tablet, 10 mg DS 12 as directed, Disp: 48 tablet, Rfl: 0  Allergies  Allergen Reactions   Celecoxib     "shakes/tremors" Other  reaction(s): other   Sesame Seed (Diagnostic) Itching   Lisinopril Rash    I personally reviewed active problem list, medication list, allergies, family history, social history, health maintenance with the patient/caregiver today.   ROS  ***  Objective  There were no vitals filed for this visit.  There is no height or weight on file to calculate BMI.  Physical Exam ***  Recent Results (from the past 2160 hour(s))  CBC with Differential/Platelet     Status: None   Collection Time: 07/06/22 10:32 AM  Result Value Ref Range   WBC 7.7 3.8 - 10.8 Thousand/uL   RBC 4.99 3.80 - 5.10 Million/uL   Hemoglobin 13.5 11.7 - 15.5 g/dL   HCT 40.7 35.0 - 45.0 %   MCV 81.6 80.0 -  100.0 fL   MCH 27.1 27.0 - 33.0 pg   MCHC 33.2 32.0 - 36.0 g/dL   RDW 14.7 11.0 - 15.0 %   Platelets 360 140 - 400 Thousand/uL   MPV 9.7 7.5 - 12.5 fL   Neutro Abs 4,612 1,500 - 7,800 cells/uL   Lymphs Abs 2,472 850 - 3,900 cells/uL   Absolute Monocytes 485 200 - 950 cells/uL   Eosinophils Absolute 108 15 - 500 cells/uL   Basophils Absolute 23 0 - 200 cells/uL   Neutrophils Relative % 59.9 %   Total Lymphocyte 32.1 %   Monocytes Relative 6.3 %   Eosinophils Relative 1.4 %   Basophils Relative 0.3 %  Iron, TIBC and Ferritin Panel     Status: Abnormal   Collection Time: 07/06/22 10:32 AM  Result Value Ref Range   Iron 62 40 - 190 mcg/dL   TIBC 427 250 - 450 mcg/dL (calc)   %SAT 15 (L) 16 - 45 % (calc)   Ferritin 21 16 - 154 ng/mL  TSH     Status: None   Collection Time: 07/06/22 10:32 AM  Result Value Ref Range   TSH 2.67 mIU/L    Comment:           Reference Range .           > or = 20 Years  0.40-4.50 .                Pregnancy Ranges           First trimester    0.26-2.66           Second trimester   0.55-2.73           Third trimester    0.43-2.91   Hemoglobin A1c     Status: Abnormal   Collection Time: 07/06/22 10:32 AM  Result Value Ref Range   Hgb A1c MFr Bld 5.9 (H) <5.7 % of total Hgb    Comment: For someone without known diabetes, a hemoglobin  A1c value between 5.7% and 6.4% is consistent with prediabetes and should be confirmed with a  follow-up test. . For someone with known diabetes, a value <7% indicates that their diabetes is well controlled. A1c targets should be individualized based on duration of diabetes, age, comorbid conditions, and other considerations. . This assay result is consistent with an increased risk of diabetes. . Currently, no consensus exists regarding use of hemoglobin A1c for diagnosis of diabetes for children. .    Mean Plasma Glucose 123 mg/dL   eAG (mmol/L) 6.8 mmol/L    PHQ2/9:    07/06/2022    9:51 AM 04/10/2022     8:35 AM 01/20/2022  2:01 PM 09/26/2021   11:23 AM 07/16/2021    1:35 PM  Depression screen PHQ 2/9  Decreased Interest 0 0 0 0 0  Down, Depressed, Hopeless 0 0 0 0 0  PHQ - 2 Score 0 0 0 0 0  Altered sleeping 0 0 0    Tired, decreased energy 0 0 3    Change in appetite 0 0 0    Feeling bad or failure about yourself  0 0 0    Trouble concentrating 0 0 0    Moving slowly or fidgety/restless 0 0 0    Suicidal thoughts 0 0 0    PHQ-9 Score 0 0 3      phq 9 is {gen pos [id number]}   Fall Risk:    07/06/2022    9:51 AM 04/10/2022    8:35 AM 01/20/2022    2:00 PM 09/26/2021   11:22 AM 07/16/2021    1:34 PM  Fall Risk   Falls in the past year? 0 0 0 0 0  Number falls in past yr: 0 0 0 0 0  Injury with Fall? 0 0 0 0 0  Risk for fall due to : No Fall Risks No Fall Risks No Fall Risks    Follow up Falls prevention discussed Falls prevention discussed Falls prevention discussed Falls evaluation completed Falls evaluation completed      Functional Status Survey:      Assessment & Plan  *** There are no diagnoses linked to this encounter.

## 2022-09-22 ENCOUNTER — Ambulatory Visit: Payer: PRIVATE HEALTH INSURANCE | Admitting: Family Medicine

## 2022-09-28 NOTE — Progress Notes (Signed)
Name: Lynn Wilson   MRN: [id number]    DOB: [birth year]   Date:09/29/2022       Progress Note  Subjective  Chief Complaint  Headaches and Chest Pain   HPI  Left side chest pain: she states over the past month she has noticed intermittent chest pain, described as fluttering, sometimes a burning sensation/stinging pain  , usually when sitting or walking. Not when laying down. Does not seemed to be triggered by food. She states it can last up to 5-10 minutes , she rubs her left breast or when at work she stops and takes some deep breaths. Episodes are happening about 2 times a week, not associated with diaphoresis, SOB or nausea .Fluttering has resolved, only one episode about one month ago  HTN: she has been out HCTZ, we will send a refill today, bp is up, having intermittent chest pain, seems atypical.   Headaches: she has daily headaches, sometimes they are migraine type, throbbing, all over her head and intense. Sometimes dull ache that can be in nuchal area. She states for migraines are triggered by skipping meals, chocolate and stress. She has been taking Excedrin daily for headaches, discussed rebound headaches and it may also be the reason for left side chest pain/dyspepsia   Smokers cough: she is still smoking but states has improved, no wheezing or sob  She was at the hospital this past weekend, states she went due to LUQ pain, nausea, she was admitted and told her liver enzymes was high, she states she does not drink alcohol   Patient Active Problem List   Diagnosis Date Noted   Pre-diabetes 09/29/2022   Smokers' cough (HCC) 09/29/2022   Symptomatic anemia 01/30/2022   Chronic left shoulder pain 02/22/2019   Chronic gastritis without bleeding 02/22/2019   Menorrhagia with regular cycle 05/10/2018   Internal bleeding hemorrhoids 05/10/2018   Migraine without aura and responsive to treatment 02/16/2018   Iron deficiency anemia due to chronic blood loss 02/16/2018    Hyperglycemia 01/29/2018   Metabolic syndrome 01/29/2018   Essential hypertension 01/25/2018   GERD (gastroesophageal reflux disease) 01/25/2018   Hypothyroidism, adult 01/25/2018   Morbid obesity (HCC) 01/25/2018    Past Surgical History:  Procedure Laterality Date   COLONOSCOPY WITH PROPOFOL N/A 09/08/2018   Procedure: COLONOSCOPY WITH PROPOFOL;  Surgeon: Toney Reil, MD;  Location: [facility] ENDOSCOPY;  Service: Gastroenterology;  Laterality: N/A;   ESOPHAGOGASTRODUODENOSCOPY (EGD) WITH PROPOFOL N/A 09/08/2018   Procedure: ESOPHAGOGASTRODUODENOSCOPY (EGD) WITH PROPOFOL;  Surgeon: Toney Reil, MD;  Location: [facility] ENDOSCOPY;  Service: Gastroenterology;  Laterality: N/A;    Family History  Problem Relation Age of Onset   Hypertension Mother    Hypertension Father    Stroke Father     Social History   Tobacco Use   Smoking status: Every Day    Packs/day: 0.75    Years: 20.00    Total pack years: 15.00    Types: Cigarettes    Start date: 01/26/2000    Passive exposure: Never   Smokeless tobacco: Never  Substance Use Topics   Alcohol use: Yes    Alcohol/week: 2.0 standard drinks of alcohol    Types: 2 Cans of beer per week     Current Outpatient Medications:    dicyclomine (BENTYL) 20 MG tablet, Take 1 tablet (20 mg total) by mouth 4 (four) times daily -  before meals and at bedtime., Disp: 40 tablet, Rfl: 0   Iron, Ferrous Sulfate, 325 (65 Fe) MG  TABS, Take 325 mg by mouth daily., Disp: 180 tablet, Rfl: 0   levothyroxine (SYNTHROID) 100 MCG tablet, TAKE 1 TABLET BY MOUTH IN THE MORNING, Disp: 90 tablet, Rfl: 0   Rimegepant Sulfate (NURTEC) 75 MG TBDP, Take 1 each by mouth every other day., Disp: 16 tablet, Rfl: 2   amLODipine (NORVASC) 5 MG tablet, Take 1 tablet (5 mg total) by mouth daily., Disp: 90 tablet, Rfl: 0   baclofen (LIORESAL) 10 MG tablet, Take 1 tablet (10 mg total) by mouth 3 (three) times daily. For headaches, Disp: 30 each, Rfl: 0    hydrochlorothiazide (HYDRODIURIL) 25 MG tablet, Take 1 tablet (25 mg total) by mouth daily., Disp: 90 tablet, Rfl: 0   pantoprazole (PROTONIX) 40 MG tablet, Take 1 tablet (40 mg total) by mouth daily., Disp: 90 tablet, Rfl: 0  Allergies  Allergen Reactions   Celecoxib     "shakes/tremors" Other reaction(s): other   Sesame Seed (Diagnostic) Itching   Lisinopril Rash    I personally reviewed active problem list, medication list, allergies, family history, social history, health maintenance with the patient/caregiver today.   ROS  Ten systems reviewed and is negative except as mentioned in HPI   Objective  Vitals:   09/29/22 1054  BP: (!) 140/90  Pulse: 100  Resp: 16  Temp: 97.6 F (36.4 C)  TempSrc: Oral  SpO2: 98%  Weight: 241 lb 14.4 oz (109.7 kg)  Height: 5\' 5"  (1.651 m)    Body mass index is 40.25 kg/m.  Physical Exam  Constitutional: Patient appears well-developed and well-nourished. Obese  No distress.  HEENT: head atraumatic, normocephalic, pupils equal and reactive to light, neck supple Cardiovascular: Normal rate, regular rhythm and normal heart sounds.  No murmur heard. No BLE edema. Pulmonary/Chest: Effort normal and breath sounds normal. No respiratory distress. Abdominal: Soft.  There is no tenderness. Psychiatric: Patient has a normal mood and affect. behavior is normal. Judgment and thought content normal.   Recent Results (from the past 2160 hour(s))  CBC with Differential/Platelet     Status: None   Collection Time: 07/06/22 10:32 AM  Result Value Ref Range   WBC 7.7 3.8 - 10.8 Thousand/uL   RBC 4.99 3.80 - 5.10 Million/uL   Hemoglobin 13.5 11.7 - 15.5 g/dL   HCT 78.2 95.6 - 21.3 %   MCV 81.6 80.0 - 100.0 fL   MCH 27.1 27.0 - 33.0 pg   MCHC 33.2 32.0 - 36.0 g/dL   RDW 08.6 57.8 - 46.9 %   Platelets 360 140 - 400 Thousand/uL   MPV 9.7 7.5 - 12.5 fL   Neutro Abs 4,612 1,500 - 7,800 cells/uL   Lymphs Abs 2,472 850 - 3,900 cells/uL   Absolute  Monocytes 485 200 - 950 cells/uL   Eosinophils Absolute 108 15 - 500 cells/uL   Basophils Absolute 23 0 - 200 cells/uL   Neutrophils Relative % 59.9 %   Total Lymphocyte 32.1 %   Monocytes Relative 6.3 %   Eosinophils Relative 1.4 %   Basophils Relative 0.3 %  Iron, TIBC and Ferritin Panel     Status: Abnormal   Collection Time: 07/06/22 10:32 AM  Result Value Ref Range   Iron 62 40 - 190 mcg/dL   TIBC 629 528 - 413 mcg/dL (calc)   %SAT 15 (L) 16 - 45 % (calc)   Ferritin 21 16 - 154 ng/mL  TSH     Status: None   Collection Time: 07/06/22 10:32 AM  Result  Value Ref Range   TSH 2.67 mIU/L    Comment:           Reference Range .           > or = 20 Years  0.40-4.50 .                Pregnancy Ranges           First trimester    0.26-2.66           Second trimester   0.55-2.73           Third trimester    0.43-2.91   Hemoglobin A1c     Status: Abnormal   Collection Time: 07/06/22 10:32 AM  Result Value Ref Range   Hgb A1c MFr Bld 5.9 (H) <5.7 % of total Hgb    Comment: For someone without known diabetes, a hemoglobin  A1c value between 5.7% and 6.4% is consistent with prediabetes and should be confirmed with a  follow-up test. . For someone with known diabetes, a value <7% indicates that their diabetes is well controlled. A1c targets should be individualized based on duration of diabetes, age, comorbid conditions, and other considerations. . This assay result is consistent with an increased risk of diabetes. . Currently, no consensus exists regarding use of hemoglobin A1c for diagnosis of diabetes for children. .    Mean Plasma Glucose 123 mg/dL   eAG (mmol/L) 6.8 mmol/L    PHQ2/9:    09/29/2022   11:01 AM 07/06/2022    9:51 AM 04/10/2022    8:35 AM 01/20/2022    2:01 PM 09/26/2021   11:23 AM  Depression screen PHQ 2/9  Decreased Interest 0 0 0 0 0  Down, Depressed, Hopeless 0 0 0 0 0  PHQ - 2 Score 0 0 0 0 0  Altered sleeping 0 0 0 0   Tired, decreased  energy 0 0 0 3   Change in appetite 0 0 0 0   Feeling bad or failure about yourself  0 0 0 0   Trouble concentrating 0 0 0 0   Moving slowly or fidgety/restless 0 0 0 0   Suicidal thoughts 0 0 0 0   PHQ-9 Score 0 0 0 3     phq 9 is negative   Fall Risk:    09/29/2022   11:01 AM 07/06/2022    9:51 AM 04/10/2022    8:35 AM 01/20/2022    2:00 PM 09/26/2021   11:22 AM  Fall Risk   Falls in the past year? 0 0 0 0 0  Number falls in past yr:  0 0 0 0  Injury with Fall?  0 0 0 0  Risk for fall due to : No Fall Risks No Fall Risks No Fall Risks No Fall Risks   Follow up Falls prevention discussed;Education provided;Falls evaluation completed Falls prevention discussed Falls prevention discussed Falls prevention discussed Falls evaluation completed      Functional Status Survey: Is the patient deaf or have difficulty hearing?: Yes Does the patient have difficulty seeing, even when wearing glasses/contacts?: No Does the patient have difficulty concentrating, remembering, or making decisions?: No Does the patient have difficulty walking or climbing stairs?: No Does the patient have difficulty dressing or bathing?: No Does the patient have difficulty doing errands alone such as visiting a doctor's office or shopping?: No    Assessment & Plan  1. Essential hypertension  - amLODipine (NORVASC) 5 MG tablet; Take  1 tablet (5 mg total) by mouth daily.  Dispense: 90 tablet; Refill: 0 - hydrochlorothiazide (HYDRODIURIL) 25 MG tablet; Take 1 tablet (25 mg total) by mouth daily.  Dispense: 90 tablet; Refill: 0  2. Smokers' cough (HCC)  She is not ready to quit smoking at this time   3. Gastroesophageal reflux disease without esophagitis  - pantoprazole (PROTONIX) 40 MG tablet; Take 1 tablet (40 mg total) by mouth daily.  Dispense: 90 tablet; Refill: 0 - Ambulatory referral to Gastroenterology  4. Hypothyroidism, adult   5. Pre-diabetes   6. Need for immunization against  influenza  - Flu Vaccine QUAD 43mo+IM (Fluarix, Fluzone & Alfiuria Quad PF)  7. Migraine without aura and responsive to treatment  - Rimegepant Sulfate (NURTEC) 75 MG TBDP; Take 1 each by mouth every other day.  Dispense: 16 tablet; Refill: 2 - baclofen (LIORESAL) 10 MG tablet; Take 1 tablet (10 mg total) by mouth 3 (three) times daily. For headaches  Dispense: 30 each; Refill: 0  8. Palpitation  - Ambulatory referral to Cardiology  9. Atypical chest pain  - Ambulatory referral to Cardiology  10. Elevated liver enzymes  - COMPLETE METABOLIC PANEL WITH GFR - Ambulatory referral to Gastroenterology

## 2022-09-29 ENCOUNTER — Encounter: Payer: Self-pay | Admitting: Internal Medicine

## 2022-09-29 ENCOUNTER — Encounter: Payer: Self-pay | Admitting: Family Medicine

## 2022-09-29 ENCOUNTER — Ambulatory Visit (INDEPENDENT_AMBULATORY_CARE_PROVIDER_SITE_OTHER): Payer: Commercial Managed Care - HMO | Admitting: Family Medicine

## 2022-09-29 VITALS — BP 140/90 | HR 100 | Temp 97.6°F | Resp 16 | Ht 65.0 in | Wt 241.9 lb

## 2022-09-29 DIAGNOSIS — J410 Simple chronic bronchitis: Secondary | ICD-10-CM | POA: Diagnosis not present

## 2022-09-29 DIAGNOSIS — G43009 Migraine without aura, not intractable, without status migrainosus: Secondary | ICD-10-CM

## 2022-09-29 DIAGNOSIS — R748 Abnormal levels of other serum enzymes: Secondary | ICD-10-CM

## 2022-09-29 DIAGNOSIS — R0789 Other chest pain: Secondary | ICD-10-CM

## 2022-09-29 DIAGNOSIS — K219 Gastro-esophageal reflux disease without esophagitis: Secondary | ICD-10-CM | POA: Diagnosis not present

## 2022-09-29 DIAGNOSIS — Z23 Encounter for immunization: Secondary | ICD-10-CM

## 2022-09-29 DIAGNOSIS — E039 Hypothyroidism, unspecified: Secondary | ICD-10-CM | POA: Diagnosis not present

## 2022-09-29 DIAGNOSIS — R002 Palpitations: Secondary | ICD-10-CM

## 2022-09-29 DIAGNOSIS — I10 Essential (primary) hypertension: Secondary | ICD-10-CM

## 2022-09-29 DIAGNOSIS — R7303 Prediabetes: Secondary | ICD-10-CM

## 2022-09-29 MED ORDER — HYDROCHLOROTHIAZIDE 25 MG PO TABS: 25.0000 mg | ORAL_TABLET | Freq: Every day | ORAL | 0 refills | Status: DC

## 2022-09-29 MED ORDER — BACLOFEN 10 MG PO TABS: 10.0000 mg | ORAL_TABLET | Freq: Three times a day (TID) | ORAL | 0 refills | Status: DC

## 2022-09-29 MED ORDER — AMLODIPINE BESYLATE 5 MG PO TABS: 5.0000 mg | ORAL_TABLET | Freq: Every day | ORAL | 0 refills | Status: DC

## 2022-09-29 MED ORDER — PANTOPRAZOLE SODIUM 40 MG PO TBEC: 40.0000 mg | DELAYED_RELEASE_TABLET | Freq: Every day | ORAL | 0 refills | Status: DC

## 2022-09-29 MED ORDER — NURTEC 75 MG PO TBDP: 1.0000 | ORAL_TABLET | ORAL | 2 refills | Status: DC

## 2022-09-30 ENCOUNTER — Encounter: Payer: Self-pay | Admitting: *Deleted

## 2022-09-30 LAB — COMPLETE METABOLIC PANEL WITH GFR
AG Ratio: 1.3 (calc) (ref 1.0–2.5)
ALT: 34 U/L — ABNORMAL HIGH (ref 6–29)
AST: 24 U/L (ref 10–30)
Albumin: 4.1 g/dL (ref 3.6–5.1)
Alkaline phosphatase (APISO): 157 U/L — ABNORMAL HIGH (ref 31–125)
BUN: 14 mg/dL (ref 7–25)
CO2: 26 mmol/L (ref 20–32)
Calcium: 9.3 mg/dL (ref 8.6–10.2)
Chloride: 101 mmol/L (ref 98–110)
Creat: 0.8 mg/dL (ref 0.50–0.97)
Globulin: 3.2 g/dL (calc) (ref 1.9–3.7)
Glucose, Bld: 127 mg/dL — ABNORMAL HIGH (ref 65–99)
Potassium: 4.1 mmol/L (ref 3.5–5.3)
Sodium: 136 mmol/L (ref 135–146)
Total Bilirubin: 0.3 mg/dL (ref 0.2–1.2)
Total Protein: 7.3 g/dL (ref 6.1–8.1)
eGFR: 96 mL/min/{1.73_m2} (ref >=60)

## 2022-10-01 ENCOUNTER — Encounter: Payer: Self-pay | Admitting: Family Medicine

## 2022-10-18 NOTE — Progress Notes (Unsigned)
Cardiology Office Note:    Date:  10/20/2022   ID:  Lynn Wilson, DOB 1981/11/02, MRN [id number]  PCP:  Steele Sizer, MD   [facility] Prov[facility] Cardiologist:  Freada Bergeron, MD {  Referring MD: Steele Sizer, MD    History of Present Illness:    Lynn Wilson is a 41 y.o. female with a hx of HTN, iron deficiency anemia, migraines and GERD who was referred by Dr. Ancil Boozer for further evaluation of palpitations and chest pain.   Patient seen by Dr. Ancil Boozer on 09/29/22 where she reported intermittent chest pain and fluttering in her chest. Symptoms occurred mainly with sitting or walking. Symptoms occurring about 2x/week, No associated SOB, diaphoresis, lightheadedness. Given symptoms, she is now referred to Cardiology for further evaluation.  Today, the patient states that she has been having flutters mainly when she lays down at night. Symptoms lasted about 1 minute before resolving. Symptoms felt like her heart "stopped for a minute" and then went back to normal. No associated SOB, chest pain, lightheadedness. Also has palpitations with exertion where she feels like her heart is racing, which improves with rest. She admits that she does not drink much water. Also has recently increased her smoking to 1ppd due to stressors in her life.    Also reports intermittent chest pain that is described as a pressure in her chest. Symptoms would last 1 hour before resolving. No known triggers. Symptoms are not exertional or positional. Has had multiple ER visits for this pain where work-up was reassuring and she was told she has "chest wall pain" as pain would worsen with pressing on her chest. She has taken ibuprofen with improvement. Denies any exertional symptoms.  Lastly, she states she had a period with significant LE edema. This has since improved but she was concerned this may be related to my heart.  No known family history of CV disease.   Blood pressures mainly  controlled at home. Compliant with medicaitons.    Past Medical History:  Diagnosis Date   GERD (gastroesophageal reflux disease)    Hypertension    Hypothyroid    Hypothyroid 01/25/2018   Iron deficiency anemia due to chronic blood loss 02/16/2018    Past Surgical History:  Procedure Laterality Date   COLONOSCOPY WITH PROPOFOL N/A 09/08/2018   Procedure: COLONOSCOPY WITH PROPOFOL;  Surgeon: Lin Landsman, MD;  Location: [facility] ENDOSCOPY;  Service: Gastroenterology;  Laterality: N/A;   ESOPHAGOGASTRODUODENOSCOPY (EGD) WITH PROPOFOL N/A 09/08/2018   Procedure: ESOPHAGOGASTRODUODENOSCOPY (EGD) WITH PROPOFOL;  Surgeon: Lin Landsman, MD;  Location: [facility] ENDOSCOPY;  Service: Gastroenterology;  Laterality: N/A;    Current Medications: Current Meds  Medication Sig   amLODipine (NORVASC) 5 MG tablet Take 1 tablet (5 mg total) by mouth daily.   baclofen (LIORESAL) 10 MG tablet Take 1 tablet (10 mg total) by mouth 3 (three) times daily. For headaches   dicyclomine (BENTYL) 20 MG tablet Take 1 tablet (20 mg total) by mouth 4 (four) times daily -  before meals and at bedtime.   hydrochlorothiazide (HYDRODIURIL) 25 MG tablet Take 1 tablet (25 mg total) by mouth daily.   Iron, Ferrous Sulfate, 325 (65 Fe) MG TABS Take 325 mg by mouth daily.   levothyroxine (SYNTHROID) 100 MCG tablet TAKE 1 TABLET BY MOUTH IN THE MORNING   pantoprazole (PROTONIX) 40 MG tablet Take 1 tablet (40 mg total) by mouth daily.   Rimegepant Sulfate (NURTEC) 75 MG TBDP Take 1 each by mouth every  other day.     Allergies:   Celecoxib, Sesame seed (diagnostic), and Lisinopril   Social History   Socioeconomic History   Marital status: Married    Spouse name: Windy Canny   Number of children: 0   Years of education: Not on file   Highest education level: Associate degree: occupational, Hotel manager, or vocational program  Occupational History   Occupation: Education officer, museum: [email or web address]  Tobacco Use    Smoking status: Every Day    Packs/day: 0.75    Years: 20.00    Total pack years: 15.00    Types: Cigarettes    Start date: 01/26/2000    Passive exposure: Never   Smokeless tobacco: Never  Vaping Use   Vaping Use: Never used  Substance and Sexual Activity   Alcohol use: Yes    Alcohol/week: 2.0 standard drinks of alcohol    Types: 2 Cans of beer per week   Drug use: No   Sexual activity: Yes    Birth control/protection: Other-see comments    Comment: homosexual, injection due to heavy vag. bleeding  Other Topics Concern   Not on file  Social History Narrative   She moved to [place] from [place], [place] in the Summer of 2018; She lives with her wife She is works at Librarian, academic, [employer]. wife had BKA of right leg in 2020   Social Determinants of Health   Financial Resource Strain: Low Risk  (07/06/2022)   Overall Financial Resource Strain (CARDIA)    Difficulty of Paying Living Expenses: Not hard at all  Food Insecurity: No Food Insecurity (07/06/2022)   Hunger Vital Sign    Worried About Running Out of Food in the Last Year: Never true    Ran Out of Food in the Last Year: Never true  Transportation Needs: No Transportation Needs (07/06/2022)   PRAPARE - Hydrologist (Medical): No    Lack of Transportation (Non-Medical): No  Physical Activity: Inactive (07/06/2022)   Exercise Vital Sign    Days of Exercise per Week: 0 days    Minutes of Exercise per Session: 0 min  Stress: No Stress Concern Present (07/06/2022)   [place]    Feeling of Stress : Not at all  Social Connections: Moderately Integrated (07/06/2022)   Social Connection and Isolation Panel [NHANES]    Frequency of Communication with Friends and Family: More than three times a week    Frequency of Social Gatherings with Friends and Family: Once a week    Attends Religious Services: More than 4 times per year    Active  Member of [employer] or Organizations: No    Attends Archivist Meetings: Never    Marital Status: Married     Family History: The patient's family history includes Hypertension in her father and mother; Stroke in her father.  ROS:   Please see the history of present illness.     All other systems reviewed and are negative.  EKGs/Labs/Other Studies Reviewed:    The following studies were reviewed today: No new CV studies  EKG:  EKG is  ordered today.  The ekg ordered today demonstrates NSR with HR 74  Recent Labs: 07/06/2022: Hemoglobin 13.5; Platelets 360; TSH 2.67 09/29/2022: ALT 34; BUN 14; Creat 0.80; Potassium 4.1; Sodium 136  Recent Lipid Panel    Component Value Date/Time   CHOL 191 01/20/2022 1453   TRIG 127 01/20/2022 1453  HDL 54 01/20/2022 1453   CHOLHDL 3.5 01/20/2022 1453   LDLCALC 113 (H) 01/20/2022 1453     Risk Assessment/Calculations:            Physical Exam:    VS:  BP (!) 132/90   Pulse 74   Ht '5\' 5"'$  (1.651 m)   Wt 242 lb 12.8 oz (110.1 kg)   SpO2 98%   BMI 40.40 kg/m     Wt Readings from Last 3 Encounters:  10/20/22 242 lb 12.8 oz (110.1 kg)  09/29/22 241 lb 14.4 oz (109.7 kg)  08/12/22 234 lb (106.1 kg)     GEN:  Well nourished, well developed in no acute distress HEENT: Normal NECK: No JVD; No carotid bruits CARDIAC: RRR, 1/6 systolic murmur. No rubs, gallops RESPIRATORY:  Clear to auscultation without rales, wheezing or rhonchi  ABDOMEN: Obese, soft, non-tender, non-distended MUSCULOSKELETAL:  No edema; No deformity  SKIN: Warm and dry NEUROLOGIC:  Alert and oriented x 3 PSYCHIATRIC:  Normal affect   ASSESSMENT:    1. Essential hypertension   2. Palpitations   3. Lower extremity edema    PLAN:    In order of problems listed above:  #Palpitations: Has intermittent chest discomfort with a "fluttering" in her chest for the past month. Symptoms occur with exertion and when laying down at night.  Denies associated  SOB, lightheadedness, dizziness or syncope. Will check zio monitor and TTE for further evaluation. -Check 14 day zio -Check TTE  #Chest Pain: Atypical. Worse with palpation of her chest. No exertional symptoms. No further work-up needed at this time.  #LE edema: Has since resolved. LE dopplers negative for DVT. Will check echo as above. -Check TTE  #HTN: States it is mainly controlled at home.  -Continue amlodipine '5mg'$  daily -Continue HCTZ '25mg'$  daily  #Tobacco Abuse: -Encourage cessation  #Morbid Obesity: -Lifestyle modifications as below  Exercise recommendations: Goal of exercising for at least 30 minutes a day, at least 5 times per week.  Please exercise to a moderate exertion.  This means that while exercising it is difficult to speak in full sentences, however you are not so short of breath that you feel you must stop, and not so comfortable that you can carry on a full conversation.  Exertion level should be approximately a 5/10, if 10 is the most exertion you can perform.  Diet recommendations: Recommend a heart healthy diet such as the Mediterranean diet.  This diet consists of plant based foods, healthy fats, lean meats, olive oil.  It suggests limiting the intake of simple carbohydrates such as white breads, pastries, and pastas.  It also limits the amount of red meat, wine, and dairy products such as cheese that one should consume on a daily basis.            Medication Adjustments/Labs and Tests Ordered: Current medicines are reviewed at length with the patient today.  Concerns regarding medicines are outlined above.  Orders Placed This Encounter  Procedures   LONG TERM MONITOR (3-14 DAYS)   EKG 12-Lead   ECHOCARDIOGRAM COMPLETE   No orders of the defined types were placed in this encounter.   Patient Instructions  Medication Instructions:  Your physician recommends that you continue on your current medications as directed. Please refer to the Current  Medication list given to you today.  *If you need a refill on your cardiac medications before your next appointment, please call your pharmacy*   Lab Work: NONE   If you  have labs (blood work) drawn today and your tests are completely normal, you will receive your results only by: [place] (if you have MyChart) OR A paper copy in the mail If you have any lab test that is abnormal or we need to change your treatment, we will call you to review the results.   Testing/Procedures: Your physician has requested that you have an echocardiogram. Echocardiography is a painless test that uses sound waves to create images of your heart. It provides your doctor with information about the size and shape of your heart and how well your heart's chambers and valves are working. This procedure takes approximately one hour. There are no restrictions for this procedure. Please do NOT wear cologne, perfume, aftershave, or lotions (deodorant is allowed). Please arrive 15 minutes prior to your appointment time.  ZIO XT- Long Term Monitor Instructions   Your physician has requested you wear your ZIO patch monitor___14__days.   This is a single patch monitor.  [employer] supplies one patch monitor per enrollment.  Additional stickers are not available.   Please do not apply patch if you will be having a Nuclear Stress Test, Echocardiogram, Cardiac CT, MRI, or Chest Xray during the time frame you would be wearing the monitor. The patch cannot be worn during these tests.  You cannot remove and re-apply the ZIO XT patch monitor.   Your ZIO patch monitor will be sent USPS Priority mail from [facility] directly to your home address. The monitor may also be mailed to a PO BOX if home delivery is not available.   It may take 3-5 days to receive your monitor after you have been enrolled.   Once you have received you monitor, please review enclosed instructions.  Your monitor has already been registered  assigning a specific monitor serial # to you.   Applying the monitor   Shave hair from upper left chest.   Hold abrader disc by orange tab.  Rub abrader in 40 strokes over left upper chest as indicated in your monitor instructions.   Clean area with 4 enclosed alcohol pads .  Use all pads to assure are is cleaned thoroughly.  Let dry.   Apply patch as indicated in monitor instructions.  Patch will be place under collarbone on left side of chest with arrow pointing upward.   Rub patch adhesive wings for 2 minutes.Remove white label marked "1".  Remove white label marked "2".  Rub patch adhesive wings for 2 additional minutes.   While looking in a mirror, press and release button in center of patch.  A small green light will flash 3-4 times .  This will be your only indicator the monitor has been turned on.     Do not shower for the first 24 hours.  You may shower after the first 24 hours.   Press button if you feel a symptom. You will hear a small click.  Record Date, Time and Symptom in the Patient Log Book.   When you are ready to remove patch, follow instructions on last 2 pages of Patient Log Book.  Stick patch monitor onto last page of Patient Log Book.   Place Patient Log Book in [place] box.  Use locking tab on box and tape box closed securely.  The [employer] and [employer] has [employer] on it.  Please place in mailbox as soon as possible.  Your physician should have your test results approximately 7 days after the monitor has been mailed back  to [employer].   Call [place] at [phone] if you have questions regarding your ZIO XT patch monitor.  Call them immediately if you see an orange light blinking on your monitor.   If your monitor falls off in less than 4 days contact our Monitor department at [phone].  If your monitor becomes loose or falls off after 4 days call [employer] at [phone] for suggestions on securing your monitor.      Follow-Up: At [facility], you and your health needs are our priority.  As part of our continuing mission to provide you with exceptional heart care, we have created designated Provider Care Teams.  These Care Teams include your primary Cardiologist (physician) and Advanced Practice Providers (APPs -  Physician Assistants and Nurse Practitioners) who all work together to provide you with the care you need, when you need it.  We recommend signing up for the patient portal called "MyChart".  Sign up information is provided on this After Visit Summary.  MyChart is used to connect with patients for Virtual Visits (Telemedicine).  Patients are able to view lab/test results, encounter notes, upcoming appointments, etc.  Non-urgent messages can be sent to your provider as well.   To learn more about what you can do with MyChart, go to [email or web address].    Your next appointment:   6 month(s)  The format for your next appointment:   In Person  Provider:   You may see Freada Bergeron, MD or one of the following Advanced Practice Providers on your designated Care Team:   Bernerd Pho, PA-C  Ermalinda Barrios, PA-C     Other Instructions Thank you for choosing [facility]!    Important Information About Sugar         Signed, Freada Bergeron, MD  10/20/2022 10:05 AM    [place]

## 2022-10-20 ENCOUNTER — Ambulatory Visit: Payer: Commercial Managed Care - HMO | Attending: Cardiology | Admitting: Cardiology

## 2022-10-20 ENCOUNTER — Encounter: Payer: Self-pay | Admitting: Cardiology

## 2022-10-20 ENCOUNTER — Ambulatory Visit: Payer: Commercial Managed Care - HMO | Attending: Cardiology

## 2022-10-20 VITALS — BP 132/90 | HR 74 | Ht 65.0 in | Wt 242.8 lb

## 2022-10-20 DIAGNOSIS — I10 Essential (primary) hypertension: Secondary | ICD-10-CM

## 2022-10-20 DIAGNOSIS — R002 Palpitations: Secondary | ICD-10-CM

## 2022-10-20 DIAGNOSIS — R600 Localized edema: Secondary | ICD-10-CM | POA: Diagnosis not present

## 2022-10-20 NOTE — Patient Instructions (Signed)
Medication Instructions:  Your physician recommends that you continue on your current medications as directed. Please refer to the Current Medication list given to you today.  *If you need a refill on your cardiac medications before your next appointment, please call your pharmacy*   Lab Work: NONE   If you have labs (blood work) drawn today and your tests are completely normal, you will receive your results only by: [place] (if you have MyChart) OR A paper copy in the mail If you have any lab test that is abnormal or we need to change your treatment, we will call you to review the results.   Testing/Procedures: Your physician has requested that you have an echocardiogram. Echocardiography is a painless test that uses sound waves to create images of your heart. It provides your doctor with information about the size and shape of your heart and how well your heart's chambers and valves are working. This procedure takes approximately one hour. There are no restrictions for this procedure. Please do NOT wear cologne, perfume, aftershave, or lotions (deodorant is allowed). Please arrive 15 minutes prior to your appointment time.  ZIO XT- Long Term Monitor Instructions   Your physician has requested you wear your ZIO patch monitor___14__days.   This is a single patch monitor.  [employer] supplies one patch monitor per enrollment.  Additional stickers are not available.   Please do not apply patch if you will be having a Nuclear Stress Test, Echocardiogram, Cardiac CT, MRI, or Chest Xray during the time frame you would be wearing the monitor. The patch cannot be worn during these tests.  You cannot remove and re-apply the ZIO XT patch monitor.   Your ZIO patch monitor will be sent USPS Priority mail from [facility] directly to your home address. The monitor may also be mailed to a PO BOX if home delivery is not available.   It may take 3-5 days to receive your monitor after you  have been enrolled.   Once you have received you monitor, please review enclosed instructions.  Your monitor has already been registered assigning a specific monitor serial # to you.   Applying the monitor   Shave hair from upper left chest.   Hold abrader disc by orange tab.  Rub abrader in 40 strokes over left upper chest as indicated in your monitor instructions.   Clean area with 4 enclosed alcohol pads .  Use all pads to assure are is cleaned thoroughly.  Let dry.   Apply patch as indicated in monitor instructions.  Patch will be place under collarbone on left side of chest with arrow pointing upward.   Rub patch adhesive wings for 2 minutes.Remove white label marked "1".  Remove white label marked "2".  Rub patch adhesive wings for 2 additional minutes.   While looking in a mirror, press and release button in center of patch.  A small green light will flash 3-4 times .  This will be your only indicator the monitor has been turned on.     Do not shower for the first 24 hours.  You may shower after the first 24 hours.   Press button if you feel a symptom. You will hear a small click.  Record Date, Time and Symptom in the Patient Log Book.   When you are ready to remove patch, follow instructions on last 2 pages of Patient Log Book.  Stick patch monitor onto last page of Patient Log Book.   Place Patient Log  Book in [facility] box.  Use locking tab on box and tape box closed securely.  The [employer] has [employer] on it.  Please place in mailbox as soon as possible.  Your physician should have your test results approximately 7 days after the monitor has been mailed back to [facility].   Call [place] at [phone] if you have questions regarding your ZIO XT patch monitor.  Call them immediately if you see an orange light blinking on your monitor.   If your monitor falls off in less than 4 days contact our Monitor department at [phone].  If your  monitor becomes loose or falls off after 4 days call [employer] at [phone] for suggestions on securing your monitor.     Follow-Up: At [facility], you and your health needs are our priority.  As part of our continuing mission to provide you with exceptional heart care, we have created designated Provider Care Teams.  These Care Teams include your primary Cardiologist (physician) and Advanced Practice Providers (APPs -  Physician Assistants and Nurse Practitioners) who all work together to provide you with the care you need, when you need it.  We recommend signing up for the patient portal called "MyChart".  Sign up information is provided on this After Visit Summary.  MyChart is used to connect with patients for Virtual Visits (Telemedicine).  Patients are able to view lab/test results, encounter notes, upcoming appointments, etc.  Non-urgent messages can be sent to your provider as well.   To learn more about what you can do with MyChart, go to [email or web address].    Your next appointment:   6 month(s)  The format for your next appointment:   In Person  Provider:   You may see Freada Bergeron, MD or one of the following Advanced Practice Providers on your designated Care Team:   Bernerd Pho, PA-C  Ermalinda Barrios, PA-C     Other Instructions Thank you for choosing [facility]!    Important Information About Sugar

## 2022-10-31 NOTE — Progress Notes (Deleted)
GI Office Note    Referring Provider: Steele Sizer, MD Primary Care Physician:  Steele Sizer, MD  Primary Gastroenterologist: ***  Chief Complaint   No chief complaint on file.    History of Present Illness   Lynn Wilson is a 41 y.o. female presenting today at the request of Steele Sizer, MD for ***GERD and elevated liver enzymes.    Labs 07/06/22: hgb 13.5, Iron sat 15%, iron 62, ferritin 21 (previously iron 27, sat 6% and ferriitn 7), A1c 5.9, TSH wnl.  ED visit 09/18/2022 for abdominal pain that woke her up. 10/10 in LUQ that radiates to her back and goes down to her rectum and vagina. Reported recent diagnosis of uterine fibroids and worsening menstrual cycles. BP elevated. Nausea. Labs with stable  CBC and UA with RBCs. AST 711, ALT 409, alk phos 212, Lipase 107, albumin 3.7. Had a CT A/P revealing fibroid uterus and diverticulosis without any acute abnormality. RUQ U/S normal. Hepatitis panel reported as negative. Salicylates and acetaminophen level normal.   Labs 09/29/22: Alk phos 157, AST 24, ALT 34, albumin 4.1, bili 0.3. AST and ALT previously elevated in February 2023.   Today: GERD - currently on pantoprazole 40 mg daily.   Elevated LFTs -    Current Outpatient Medications  Medication Sig Dispense Refill   amLODipine (NORVASC) 5 MG tablet Take 1 tablet (5 mg total) by mouth daily. 90 tablet 0   baclofen (LIORESAL) 10 MG tablet Take 1 tablet (10 mg total) by mouth 3 (three) times daily. For headaches 30 each 0   dicyclomine (BENTYL) 20 MG tablet Take 1 tablet (20 mg total) by mouth 4 (four) times daily -  before meals and at bedtime. 40 tablet 0   hydrochlorothiazide (HYDRODIURIL) 25 MG tablet Take 1 tablet (25 mg total) by mouth daily. 90 tablet 0   Iron, Ferrous Sulfate, 325 (65 Fe) MG TABS Take 325 mg by mouth daily. 180 tablet 0   levothyroxine (SYNTHROID) 100 MCG tablet TAKE 1 TABLET BY MOUTH IN THE MORNING 90 tablet 0   pantoprazole  (PROTONIX) 40 MG tablet Take 1 tablet (40 mg total) by mouth daily. 90 tablet 0   Rimegepant Sulfate (NURTEC) 75 MG TBDP Take 1 each by mouth every other day. 16 tablet 2   No current facility-administered medications for this visit.    Past Medical History:  Diagnosis Date   GERD (gastroesophageal reflux disease)    Hypertension    Hypothyroid    Hypothyroid 01/25/2018   Iron deficiency anemia due to chronic blood loss 02/16/2018    Past Surgical History:  Procedure Laterality Date   COLONOSCOPY WITH PROPOFOL N/A 09/08/2018   Procedure: COLONOSCOPY WITH PROPOFOL;  Surgeon: Lin Landsman, MD;  Location: [facility] ENDOSCOPY;  Service: Gastroenterology;  Laterality: N/A;   ESOPHAGOGASTRODUODENOSCOPY (EGD) WITH PROPOFOL N/A 09/08/2018   Procedure: ESOPHAGOGASTRODUODENOSCOPY (EGD) WITH PROPOFOL;  Surgeon: Lin Landsman, MD;  Location: [facility] ENDOSCOPY;  Service: Gastroenterology;  Laterality: N/A;    Family History  Problem Relation Age of Onset   Hypertension Mother    Hypertension Father    Stroke Father     Allergies as of 11/02/2022 - Review Complete 10/20/2022  Allergen Reaction Noted   Celecoxib  03/08/2012   Sesame seed (diagnostic) Itching 01/25/2018   Lisinopril Rash 03/08/2012    Social History   Socioeconomic History   Marital status: Married    Spouse name: Lynn Wilson   Number of children: 0  Years of education: Not on file   Highest education level: Associate degree: occupational, Hotel manager, or vocational program  Occupational History   Occupation: Education officer, museum: [email or web address]  Tobacco Use   Smoking status: Every Day    Packs/day: 0.75    Years: 20.00    Total pack years: 15.00    Types: Cigarettes    Start date: 01/26/2000    Passive exposure: Never   Smokeless tobacco: Never  Vaping Use   Vaping Use: Never used  Substance and Sexual Activity   Alcohol use: Yes    Alcohol/week: 2.0 standard drinks of alcohol    Types: 2 Cans of beer per  week   Drug use: No   Sexual activity: Yes    Birth control/protection: Other-see comments    Comment: homosexual, injection due to heavy vag. bleeding  Other Topics Concern   Not on file  Social History Narrative   She moved to [place] from [place], [place] in the Summer of 2018; She lives with her wife She is works at Librarian, academic, [employer]. wife had BKA of right leg in 2020   Social Determinants of Health   Financial Resource Strain: Low Risk  (07/06/2022)   Overall Financial Resource Strain (CARDIA)    Difficulty of Paying Living Expenses: Not hard at all  Food Insecurity: No Food Insecurity (07/06/2022)   Hunger Vital Sign    Worried About Running Out of Food in the Last Year: Never true    Ran Out of Food in the Last Year: Never true  Transportation Needs: No Transportation Needs (07/06/2022)   PRAPARE - Hydrologist (Medical): No    Lack of Transportation (Non-Medical): No  Physical Activity: Inactive (07/06/2022)   Exercise Vital Sign    Days of Exercise per Week: 0 days    Minutes of Exercise per Session: 0 min  Stress: No Stress Concern Present (07/06/2022)   [place]    Feeling of Stress : Not at all  Social Connections: Moderately Integrated (07/06/2022)   Social Connection and Isolation Panel [NHANES]    Frequency of Communication with Friends and Family: More than three times a week    Frequency of Social Gatherings with Friends and Family: Once a week    Attends Religious Services: More than 4 times per year    Active Member of [employer] or Organizations: No    Attends Archivist Meetings: Never    Marital Status: Married  Human resources officer Violence: Not At Risk (07/06/2022)   Humiliation, Afraid, Rape, and Kick questionnaire    Fear of Current or Ex-Partner: No    Emotionally Abused: No    Physically Abused: No    Sexually Abused: No     Review of Systems    Gen: Denies any fever, chills, fatigue, weight loss, lack of appetite.  CV: Denies chest pain, heart palpitations, peripheral edema, syncope.  Resp: Denies shortness of breath at rest or with exertion. Denies wheezing or cough.  GI: see HPI GU : Denies urinary burning, urinary frequency, urinary hesitancy MS: Denies joint pain, muscle weakness, cramps, or limitation of movement.  Derm: Denies rash, itching, dry skin Psych: Denies depression, anxiety, memory loss, and confusion Heme: Denies bruising, bleeding, and enlarged lymph nodes.   Physical Exam   There were no vitals taken for this visit.  General:   Alert and oriented. Pleasant and cooperative. Well-nourished and well-developed.  Head:  Normocephalic and atraumatic. Eyes:  Without icterus, sclera clear and conjunctiva pink.  Ears:  Normal auditory acuity. Mouth:  No deformity or lesions, oral mucosa pink.  Lungs:  Clear to auscultation bilaterally. No wheezes, rales, or rhonchi. No distress.  Heart:  S1, S2 present without murmurs appreciated.  Abdomen:  +BS, soft, non-tender and non-distended. No HSM noted. No guarding or rebound. No masses appreciated.  Rectal:  Deferred  Msk:  Symmetrical without gross deformities. Normal posture. Extremities:  Without edema. Neurologic:  Alert and  oriented x4;  grossly normal neurologically. Skin:  Intact without significant lesions or rashes. Psych:  Alert and cooperative. Normal mood and affect.   Assessment   Lynn Wilson is a 41 y.o. female with a history of GERD, HTN, hypothyroidism, Iron deficiency anemia*** presenting today for evaluation of GERD and elevated LFTs.  GERD:   Elevated LFTs:  Iron deficiency:    PLAN   ***    Venetia Night, MSN, FNP-BC, AGACNP-BC [facility]

## 2022-11-02 ENCOUNTER — Ambulatory Visit: Payer: Commercial Managed Care - HMO | Admitting: Gastroenterology

## 2022-11-03 ENCOUNTER — Ambulatory Visit (HOSPITAL_COMMUNITY): Admission: RE | Admit: 2022-11-03 | Payer: Commercial Managed Care - HMO | Source: Ambulatory Visit

## 2022-11-09 ENCOUNTER — Ambulatory Visit (HOSPITAL_COMMUNITY): Admission: RE | Admit: 2022-11-09 | Payer: Commercial Managed Care - HMO | Source: Ambulatory Visit

## 2022-11-12 ENCOUNTER — Ambulatory Visit (HOSPITAL_COMMUNITY)
Admission: RE | Admit: 2022-11-12 | Discharge: 2022-11-12 | Disposition: A | Discharge status: Home / Self Care | Payer: Commercial Managed Care - HMO | Source: Ambulatory Visit | Attending: Cardiology | Admitting: Cardiology

## 2022-11-12 DIAGNOSIS — R002 Palpitations: Secondary | ICD-10-CM | POA: Diagnosis not present

## 2022-11-12 DIAGNOSIS — R600 Localized edema: Secondary | ICD-10-CM | POA: Insufficient documentation

## 2022-11-12 LAB — ECHOCARDIOGRAM COMPLETE
Area-P 1/2: 3.48 cm2
S' Lateral: 2.7 cm

## 2022-11-12 NOTE — Progress Notes (Signed)
*  PRELIMINARY RESULTS* Echocardiogram 2D Echocardiogram has been performed.  Lynn Wilson 11/12/2022, 12:30 PM

## 2022-12-09 NOTE — Progress Notes (Deleted)
GI Office Note    Referring Provider: Steele Sizer, MD Primary Care Physician:  Steele Sizer, MD  Primary Gastroenterologist: ***, previously Dr. Marius Ditch with [facility]  Chief Complaint   No chief complaint on file.   History of Present Illness   Lynn Wilson is a 41 y.o. female presenting today at the request of Steele Sizer, MD for ***elevated LFTs.   RUQ [place] in December 2019 with possible fatty liver. Normal CBD. No gallstones.   Labs 07/06/22: A1c 5.9, TSH 2.67, Iron 62, Sat 15%, ferritin 21, Hgb 13.5, MCV 81  ED visit 09/17/22 at [facility], [place]. Admitted to fibroids and being on menstrual cycle at the time. Abdominal pain that woke her up. Pain different from menstrual cycle. Alk phos 212 (H), AST 711 (H), ALT 409(H), lipase 107, albumin 3.7, K 3.8, Hgb 13.4, plts 338. CT with no acute pathology. Fibroid uterus. RUQ read as normal. Advised MRCP if continued rise in LFTs. Hepatitis panel negative. Negative salicylates and acetaminophen. Advised to follow up with GI.   Seen PCP 09/29/22. Having HTN and left sided chest pain. Also headaches. Reported recent hospital visit for LUQ pain and nausea, told liver enzymes elevated. Denied alcohol use. CMP ordered and referred to GI. -Labs 09/29/22: alk phos 157 (H), ALT 34(H), AST 24, T. Bili 0.3    Today:    Current Outpatient Medications  Medication Sig Dispense Refill   amLODipine (NORVASC) 5 MG tablet Take 1 tablet (5 mg total) by mouth daily. 90 tablet 0   baclofen (LIORESAL) 10 MG tablet Take 1 tablet (10 mg total) by mouth 3 (three) times daily. For headaches 30 each 0   dicyclomine (BENTYL) 20 MG tablet Take 1 tablet (20 mg total) by mouth 4 (four) times daily -  before meals and at bedtime. 40 tablet 0   hydrochlorothiazide (HYDRODIURIL) 25 MG tablet Take 1 tablet (25 mg total) by mouth daily. 90 tablet 0   Iron, Ferrous Sulfate, 325 (65 Fe) MG TABS Take 325 mg by mouth daily. 180 tablet 0    levothyroxine (SYNTHROID) 100 MCG tablet TAKE 1 TABLET BY MOUTH IN THE MORNING 90 tablet 0   pantoprazole (PROTONIX) 40 MG tablet Take 1 tablet (40 mg total) by mouth daily. 90 tablet 0   Rimegepant Sulfate (NURTEC) 75 MG TBDP Take 1 each by mouth every other day. 16 tablet 2   No current facility-administered medications for this visit.    Past Medical History:  Diagnosis Date   GERD (gastroesophageal reflux disease)    Hypertension    Hypothyroid    Hypothyroid 01/25/2018   Iron deficiency anemia due to chronic blood loss 02/16/2018    Past Surgical History:  Procedure Laterality Date   COLONOSCOPY WITH PROPOFOL N/A 09/08/2018   Procedure: COLONOSCOPY WITH PROPOFOL;  Surgeon: Lin Landsman, MD;  Location: [facility] ENDOSCOPY;  Service: Gastroenterology;  Laterality: N/A;   ESOPHAGOGASTRODUODENOSCOPY (EGD) WITH PROPOFOL N/A 09/08/2018   Procedure: ESOPHAGOGASTRODUODENOSCOPY (EGD) WITH PROPOFOL;  Surgeon: Lin Landsman, MD;  Location: [facility] ENDOSCOPY;  Service: Gastroenterology;  Laterality: N/A;    Family History  Problem Relation Age of Onset   Hypertension Mother    Hypertension Father    Stroke Father     Allergies as of 12/10/2022 - Review Complete 10/20/2022  Allergen Reaction Noted   Celecoxib  03/08/2012   Sesame seed (diagnostic) Itching 01/25/2018   Lisinopril Rash 03/08/2012    Social History   Socioeconomic History  Marital status: Married    Spouse name: Windy Canny   Number of children: 0   Years of education: Not on file   Highest education level: Associate degree: occupational, Hotel manager, or vocational program  Occupational History   Occupation: Education officer, museum: [email or web address]  Tobacco Use   Smoking status: Every Day    Packs/day: 0.75    Years: 20.00    Total pack years: 15.00    Types: Cigarettes    Start date: 01/26/2000    Passive exposure: Never   Smokeless tobacco: Never  Vaping Use   Vaping Use: Never used  Substance and Sexual  Activity   Alcohol use: Yes    Alcohol/week: 2.0 standard drinks of alcohol    Types: 2 Cans of beer per week   Drug use: No   Sexual activity: Yes    Birth control/protection: Other-see comments    Comment: homosexual, injection due to heavy vag. 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   Social Determinants of Health   Financial Resource Strain: Low Risk  (07/06/2022)   Overall Financial Resource Strain (CARDIA)    Difficulty of Paying Living Expenses: Not hard at all  Food Insecurity: No Food Insecurity (07/06/2022)   Hunger Vital Sign    Worried About Running Out of Food in the Last Year: Never true    Ran Out of Food in the Last Year: Never true  Transportation Needs: No Transportation Needs (07/06/2022)   PRAPARE - Hydrologist (Medical): No    Lack of Transportation (Non-Medical): No  Physical Activity: Inactive (07/06/2022)   Exercise Vital Sign    Days of Exercise per Week: 0 days    Minutes of Exercise per Session: 0 min  Stress: No Stress Concern Present (07/06/2022)   [place]    Feeling of Stress : Not at all  Social Connections: Moderately Integrated (07/06/2022)   Social Connection and Isolation Panel [NHANES]    Frequency of Communication with Friends and Family: More than three times a week    Frequency of Social Gatherings with Friends and Family: Once a week    Attends Religious Services: More than 4 times per year    Active Member of [employer] or Organizations: No    Attends Archivist Meetings: Never    Marital Status: Married  Human resources officer Violence: Not At Risk (07/06/2022)   Humiliation, Afraid, Rape, and Kick questionnaire    Fear of Current or Ex-Partner: No     Emotionally Abused: No    Physically Abused: No    Sexually Abused: No     Review of Systems   Gen: Denies any fever, chills, fatigue, weight loss, lack of appetite.  CV: Denies chest pain, heart palpitations, peripheral edema, syncope.  Resp: Denies shortness of breath at rest or with exertion. Denies wheezing or cough.  GI: see HPI GU : Denies urinary burning, urinary frequency, urinary hesitancy MS: Denies joint pain, muscle weakness, cramps, or limitation of movement.  Derm: Denies rash, itching, dry skin Psych: Denies depression, anxiety, memory loss, and confusion Heme: Denies bruising, bleeding, and enlarged lymph nodes.   Physical Exam   There were no vitals taken for  this visit.  General:   Alert and oriented. Pleasant and cooperative. Well-nourished and well-developed.  Head:  Normocephalic and atraumatic. Eyes:  Without icterus, sclera clear and conjunctiva pink.  Ears:  Normal auditory acuity. Mouth:  No deformity or lesions, oral mucosa pink.  Lungs:  Clear to auscultation bilaterally. No wheezes, rales, or rhonchi. No distress.  Heart:  S1, S2 present without murmurs appreciated.  Abdomen:  +BS, soft, non-tender and non-distended. No HSM noted. No guarding or rebound. No masses appreciated.  Rectal:  Deferred *** Msk:  Symmetrical without gross deformities. Normal posture. Extremities:  Without edema. Neurologic:  Alert and  oriented x4;  grossly normal neurologically. Skin:  Intact without significant lesions or rashes. Psych:  Alert and cooperative. Normal mood and affect.   Assessment   Lynn Wilson is a 41 y.o. female with a history of hemorrhoids s/p banding in 2019, IDA, GERD, HTN, hypothyroidism *** presenting today for evaluation of elevated LFTs.   Elevated LFTs: OSH admission early October with significant transaminitis with AST >ALT and elevated alk phos. Most recent labs 09/29/22 2 weeks after hospitalization with improved LFTs (alk phos  157, ALT 34, AST 24, T. Bili 0.3). OSH RUQ [place] and CT scan reported as normal without any acute process.    PLAN   *** HFP, GGT?, ANA, ASMA, AMA, IgG, IgA, IgM, CBC, Iron panel, INR    Venetia Night, MSN, FNP-BC, AGACNP-BC [facility]

## 2022-12-10 ENCOUNTER — Ambulatory Visit: Payer: Commercial Managed Care - HMO | Admitting: Gastroenterology

## 2023-01-16 ENCOUNTER — Other Ambulatory Visit: Payer: Self-pay | Admitting: Nurse Practitioner

## 2023-01-16 DIAGNOSIS — E039 Hypothyroidism, unspecified: Secondary | ICD-10-CM

## 2023-01-18 NOTE — Telephone Encounter (Signed)
Requested Prescriptions  Pending Prescriptions Disp Refills   levothyroxine (SYNTHROID) 100 MCG tablet [Pharmacy Med Name: Levothyroxine Sodium 100 MCG Oral Tablet] 90 tablet 0    Sig: TAKE 1 TABLET BY MOUTH IN THE MORNING     Endocrinology:  Hypothyroid Agents Passed - 01/16/2023 12:33 PM      Passed - TSH in normal range and within 360 days    TSH  Date Value Ref Range Status  07/06/2022 2.67 mIU/L Final    Comment:              Reference Range .           > or = 20 Years  0.40-4.50 .                Pregnancy Ranges           First trimester    0.26-2.66           Second trimester   0.55-2.73           Third trimester    0.43-2.91          Passed - Valid encounter within last 12 months    Recent Outpatient Visits           3 months ago Essential hypertension   [facility] Steele Sizer, MD   6 months ago Well adult exam   [facility] Steele Sizer, MD   9 months ago Upper respiratory tract infection, unspecified type   [place], PA-C   12 months ago Essential hypertension   [facility] Steele Sizer, MD   1 year ago Essential hypertension   [facility] Bo Merino, FNP       Future Appointments             In 2 months Strader, Fransisco Hertz, PA-C [place] [facility] [place]

## 2023-02-11 ENCOUNTER — Encounter: Payer: Self-pay | Admitting: Radiology

## 2023-04-09 ENCOUNTER — Encounter: Payer: Self-pay | Admitting: Internal Medicine

## 2023-04-14 ENCOUNTER — Encounter: Payer: Self-pay | Admitting: Internal Medicine

## 2023-04-16 ENCOUNTER — Ambulatory Visit: Payer: Commercial Managed Care - HMO | Admitting: Student

## 2023-05-07 ENCOUNTER — Encounter: Payer: Self-pay | Admitting: Internal Medicine

## 2023-05-17 ENCOUNTER — Encounter: Payer: Self-pay | Admitting: Internal Medicine

## 2023-05-18 ENCOUNTER — Other Ambulatory Visit: Payer: Self-pay | Admitting: Nurse Practitioner

## 2023-05-18 DIAGNOSIS — E039 Hypothyroidism, unspecified: Secondary | ICD-10-CM

## 2023-05-18 NOTE — Telephone Encounter (Signed)
Requested Prescriptions  Pending Prescriptions Disp Refills   levothyroxine (SYNTHROID) 100 MCG tablet [Pharmacy Med Name: Levothyroxine Sodium 100 MCG Oral Tablet] 90 tablet 0    Sig: TAKE 1 TABLET BY MOUTH IN THE MORNING     Endocrinology:  Hypothyroid Agents Passed - 05/18/2023  1:09 PM      Passed - TSH in normal range and within 360 days    TSH  Date Value Ref Range Status  07/06/2022 2.67 mIU/L Final    Comment:              Reference Range .           > or = 20 Years  0.40-4.50 .                Pregnancy Ranges           First trimester    0.26-2.66           Second trimester   0.55-2.73           Third trimester    0.43-2.91          Passed - Valid encounter within last 12 months    Recent Outpatient Visits           7 months ago Essential hypertension   [facility] Alba Cory, MD   10 months ago Well adult exam   [facility] Alba Cory, MD   1 year ago Upper respiratory tract infection, unspecified type   [facility] Mecum, Oswaldo [place], PA-C   1 year ago Essential hypertension   [facility] Alba Cory, MD   1 year ago Essential hypertension   [facility] Berniece Salines, FNP       Future Appointments             In 6 days Danelle Berry, PA-C [facility]

## 2023-05-24 ENCOUNTER — Other Ambulatory Visit
Admission: RE | Admit: 2023-05-24 | Discharge: 2023-05-24 | Disposition: A | Discharge status: Home / Self Care | Payer: Medicaid Other | Attending: Family Medicine | Admitting: Family Medicine

## 2023-05-24 ENCOUNTER — Ambulatory Visit (INDEPENDENT_AMBULATORY_CARE_PROVIDER_SITE_OTHER): Payer: Self-pay | Admitting: Family Medicine

## 2023-05-24 VITALS — BP 128/76 | HR 83 | Resp 16 | Ht 65.0 in | Wt 233.0 lb

## 2023-05-24 DIAGNOSIS — R109 Unspecified abdominal pain: Secondary | ICD-10-CM

## 2023-05-24 DIAGNOSIS — R748 Abnormal levels of other serum enzymes: Secondary | ICD-10-CM | POA: Insufficient documentation

## 2023-05-24 DIAGNOSIS — K219 Gastro-esophageal reflux disease without esophagitis: Secondary | ICD-10-CM

## 2023-05-24 DIAGNOSIS — I10 Essential (primary) hypertension: Secondary | ICD-10-CM

## 2023-05-24 DIAGNOSIS — R1012 Left upper quadrant pain: Secondary | ICD-10-CM

## 2023-05-24 LAB — COMPREHENSIVE METABOLIC PANEL
ALT: 27 U/L (ref 0–44)
AST: 35 U/L (ref 15–41)
Albumin: 4.2 g/dL (ref 3.5–5.0)
Alkaline Phosphatase: 76 U/L (ref 38–126)
Anion gap: 10 (ref 5–15)
BUN: 14 mg/dL (ref 6–20)
CO2: 24 mmol/L (ref 22–32)
Calcium: 9.1 mg/dL (ref 8.9–10.3)
Chloride: 101 mmol/L (ref 98–111)
Creatinine, Ser: 0.83 mg/dL (ref 0.44–1.00)
GFR, Estimated: >60 mL/min (ref >60)
Glucose, Bld: 97 mg/dL (ref 70–99)
Potassium: 3.4 mmol/L — ABNORMAL LOW (ref 3.5–5.1)
Sodium: 135 mmol/L (ref 135–145)
Total Bilirubin: 0.5 mg/dL (ref 0.3–1.2)
Total Protein: 8.2 g/dL — ABNORMAL HIGH (ref 6.5–8.1)

## 2023-05-24 LAB — CBC WITH DIFFERENTIAL/PLATELET
Abs Immature Granulocytes: 0.01 10*3/uL (ref 0.00–0.07)
Basophils Absolute: 0 10*3/uL (ref 0.0–0.1)
Basophils Relative: 0 %
Eosinophils Absolute: 0.1 10*3/uL (ref 0.0–0.5)
Eosinophils Relative: 2 %
HCT: 34.9 % — ABNORMAL LOW (ref 36.0–46.0)
Hemoglobin: 11 g/dL — ABNORMAL LOW (ref 12.0–15.0)
Immature Granulocytes: 0 %
Lymphocytes Relative: 45 %
Lymphs Abs: 2.4 10*3/uL (ref 0.7–4.0)
MCH: 22.6 pg — ABNORMAL LOW (ref 26.0–34.0)
MCHC: 31.5 g/dL (ref 30.0–36.0)
MCV: 71.8 fL — ABNORMAL LOW (ref 80.0–100.0)
Monocytes Absolute: 0.4 10*3/uL (ref 0.1–1.0)
Monocytes Relative: 8 %
Neutro Abs: 2.4 10*3/uL (ref 1.7–7.7)
Neutrophils Relative %: 45 %
Platelets: 415 10*3/uL — ABNORMAL HIGH (ref 150–400)
RBC: 4.86 MIL/uL (ref 3.87–5.11)
RDW: 18 % — ABNORMAL HIGH (ref 11.5–15.5)
WBC: 5.3 10*3/uL (ref 4.0–10.5)
nRBC: 0 % (ref 0.0–0.2)

## 2023-05-24 LAB — LIPASE, BLOOD: Lipase: 33 U/L (ref 11–51)

## 2023-05-24 MED ORDER — AMLODIPINE BESYLATE 5 MG PO TABS: 5.0000 mg | ORAL_TABLET | Freq: Every day | ORAL | 0 refills | Status: DC

## 2023-05-24 MED ORDER — PANTOPRAZOLE SODIUM 40 MG PO TBEC: 40.0000 mg | DELAYED_RELEASE_TABLET | Freq: Two times a day (BID) | ORAL | 0 refills | Status: AC

## 2023-05-24 MED ORDER — HYDROCHLOROTHIAZIDE 25 MG PO TABS
25.0000 mg | ORAL_TABLET | Freq: Every day | ORAL | 0 refills | Status: AC
Start: 2023-05-24 — End: ?

## 2023-05-24 MED ORDER — DICYCLOMINE HCL 20 MG PO TABS: 20.0000 mg | ORAL_TABLET | Freq: Three times a day (TID) | ORAL | 1 refills | Status: AC | PRN

## 2023-05-24 NOTE — Progress Notes (Signed)
Patient ID: Lynn Wilson, female    DOB: 01/24/1981, 42 y.o.   MRN: [id number]  PCP: Alba Cory, MD  Chief Complaint  Patient presents with   Abdominal Pain    Under L breast, x2 months. "Sharp/aching pain" intermittently     Subjective:   Lynn Wilson is a 42 y.o. female, presents to clinic with CC of the following:  HPI  Here with LUQ abd pain x 1 year with hx of ED visit and elevated liver enzymes Last labs were October increased Alk phos Sometimes pain is worse when she eats Crampy pain, sometimes sharp stabbing pain severe "stops her in her tracks" but baseline is dull achy constant pain She has pain associated previously with diarrhea but she also sometimes has some constipation  No past surgery She was on bentyl On protonix already for GERD   Patient Active Problem List   Diagnosis Date Noted   Pre-diabetes 09/29/2022   Smokers' cough (HCC) 09/29/2022   Symptomatic anemia 01/30/2022   Chronic left shoulder pain 02/22/2019   Chronic gastritis without bleeding 02/22/2019   Menorrhagia with regular cycle 05/10/2018   Internal bleeding hemorrhoids 05/10/2018   Migraine without aura and responsive to treatment 02/16/2018   Iron deficiency anemia due to chronic blood loss 02/16/2018   Hyperglycemia 01/29/2018   Metabolic syndrome 01/29/2018   Essential hypertension 01/25/2018   GERD (gastroesophageal reflux disease) 01/25/2018   Hypothyroidism, adult 01/25/2018   Morbid obesity (HCC) 01/25/2018      Current Outpatient Medications:    baclofen (LIORESAL) 10 MG tablet, Take 1 tablet (10 mg total) by mouth 3 (three) times daily. For headaches, Disp: 30 each, Rfl: 0   hydrochlorothiazide (HYDRODIURIL) 25 MG tablet, Take 1 tablet (25 mg total) by mouth daily., Disp: 90 tablet, Rfl: 0   Iron, Ferrous Sulfate, 325 (65 Fe) MG TABS, Take 325 mg by mouth daily., Disp: 180 tablet, Rfl: 0   levothyroxine (SYNTHROID) 100 MCG tablet, TAKE 1 TABLET BY MOUTH  IN THE MORNING, Disp: 90 tablet, Rfl: 0   pantoprazole (PROTONIX) 40 MG tablet, Take 1 tablet (40 mg total) by mouth daily., Disp: 90 tablet, Rfl: 0   Rimegepant Sulfate (NURTEC) 75 MG TBDP, Take 1 each by mouth every other day., Disp: 16 tablet, Rfl: 2   amLODipine (NORVASC) 5 MG tablet, Take 1 tablet (5 mg total) by mouth daily. (Patient not taking: Reported on 05/24/2023), Disp: 90 tablet, Rfl: 0   dicyclomine (BENTYL) 20 MG tablet, Take 1 tablet (20 mg total) by mouth 4 (four) times daily -  before meals and at bedtime. (Patient not taking: Reported on 05/24/2023), Disp: 40 tablet, Rfl: 0   Allergies  Allergen Reactions   Celecoxib     "shakes/tremors" Other reaction(s): other   Sesame Seed (Diagnostic) Itching   Lisinopril Rash     Social History   Tobacco Use   Smoking status: Every Day    Packs/day: 0.75    Years: 20.00    Additional pack years: 0.00    Total pack years: 15.00    Types: Cigarettes    Start date: 01/26/2000    Passive exposure: Never   Smokeless tobacco: Never  Vaping Use   Vaping Use: Never used  Substance Use Topics   Alcohol use: Yes    Alcohol/week: 2.0 standard drinks of alcohol    Types: 2 Cans of beer per week   Drug use: No      Chart Review Today: I personally  reviewed active problem list, medication list, allergies, family history, social history, health maintenance, notes from last encounter, lab results, imaging with the patient/caregiver today.   Review of Systems  Constitutional: Negative.   HENT: Negative.    Eyes: Negative.   Respiratory: Negative.    Cardiovascular: Negative.   Gastrointestinal: Negative.   Endocrine: Negative.   Genitourinary: Negative.   Musculoskeletal: Negative.   Skin: Negative.   Allergic/Immunologic: Negative.   Neurological: Negative.   Hematological: Negative.   Psychiatric/Behavioral: Negative.    All other systems reviewed and are negative.      Objective:   Vitals:   05/24/23 1116  BP:  128/76  Pulse: 83  Resp: 16  SpO2: 99%  Weight: 233 lb (105.7 kg)  Height: 5\' 5"  (1.651 m)    Body mass index is 38.77 kg/m.  Physical Exam Vitals and nursing note reviewed.  Constitutional:      General: She is not in acute distress.    Appearance: She is well-developed. She is obese. She is not ill-appearing, toxic-appearing or diaphoretic.  HENT:     Head: Normocephalic and atraumatic.     Right Ear: External ear normal.     Left Ear: External ear normal.     Nose: Nose normal.     Mouth/Throat:     Mouth: Mucous membranes are moist.     Pharynx: Oropharynx is clear.  Eyes:     General: No scleral icterus.       Right eye: No discharge.        Left eye: No discharge.     Conjunctiva/sclera: Conjunctivae normal.  Neck:     Trachea: No tracheal deviation.  Cardiovascular:     Rate and Rhythm: Normal rate and regular rhythm.     Pulses: Normal pulses.     Heart sounds: Normal heart sounds.  Pulmonary:     Effort: Pulmonary effort is normal. No respiratory distress.     Breath sounds: Normal breath sounds. No stridor.  Abdominal:     General: Bowel sounds are normal. There is no distension.     Palpations: Abdomen is soft. There is no fluid wave, hepatomegaly, splenomegaly, mass or pulsatile mass.     Tenderness: There is generalized abdominal tenderness. There is no right CVA tenderness, left CVA tenderness, guarding or rebound. Negative signs include Murphy's sign, Rovsing's sign and McBurney's sign.  Musculoskeletal:        General: Normal range of motion.  Skin:    General: Skin is warm and dry.     Coloration: Skin is not jaundiced.     Findings: No rash.  Neurological:     Mental Status: She is alert.     Motor: No abnormal muscle tone.     Coordination: Coordination normal.  Psychiatric:        Mood and Affect: Mood normal.        Behavior: Behavior normal.      Results for orders placed or performed during the hospital encounter of 11/12/22   ECHOCARDIOGRAM COMPLETE  Result Value Ref Range   Area-P 1/2 3.48 cm2   S' Lateral 2.70 cm       Assessment & Plan:   Pt presents with 1 year of abd pain, she also request bentyl refills which often help with abd/pelvic cramping She has been unable to f/up due to lack on insurance and cost   1. Left upper quadrant abdominal pain Left to epigastric ttp, mild, sometimes worse with eating, BM alternate  loose to constipated Will try tx with PPI Consider RUQ US/hida scan if no improvement, she has previously been referred to GI but unable to go - currently insurance is not active - which pt was not aware of - which will limit proceeding with some of work up or f/up She was encouraged to call [place] billing/for charity care She does want to do labs today - [facility]  - CBC with Differential/Platelet; Future - Comprehensive metabolic panel; Future - Lipase, blood; Future - Ambulatory referral to Gastroenterology  2. Gastroesophageal reflux disease without esophagitis Abd pain may be GERD/gastritis/Pud, trial of PPI BID for 2-4 weeks then can decrease to once daily F/up here or with GI in a month - pantoprazole (PROTONIX) 40 MG tablet; Take 1 tablet (40 mg total) by mouth 2 (two) times daily.  Dispense: 60 tablet; Refill: 0 - Ambulatory referral to Gastroenterology  3. Elevated liver enzymes Recheck labs - Comprehensive metabolic panel; Future - Lipase, blood; Future - Ambulatory referral to Gastroenterology  4. Elevated alkaline phosphatase level Recheck labs - Comprehensive metabolic panel; Future - Lipase, blood; Future - Ambulatory referral to Gastroenterology  5. Abdominal pain, unspecified abdominal location See #1 Refill on bentyl, explained its not usually for long term use but can use prn for cramping - dicyclomine (BENTYL) 20 MG tablet; Take 1 tablet (20 mg total) by mouth 3 (three) times daily as needed for spasms (abd cramping).  Dispense: 60  tablet; Refill: 1 - Ambulatory referral to Gastroenterology  6. Essential hypertension BP Readings from Last 3 Encounters:  05/24/23 128/76  10/20/22 (!) 132/90  09/29/22 (!) 140/90  She needs med refills, bp is well controlled and at goal today - labs will be checked today as well - amLODipine (NORVASC) 5 MG tablet; Take 1 tablet (5 mg total) by mouth daily.  Dispense: 30 tablet; Refill: 0 - hydrochlorothiazide (HYDRODIURIL) 25 MG tablet; Take 1 tablet (25 mg total) by mouth daily.  Dispense: 30 tablet; Refill: 0   Danelle Berry, PA-C 05/24/23 11:26 AM

## 2023-06-02 ENCOUNTER — Encounter: Payer: Self-pay | Admitting: Family Medicine

## 2023-06-08 ENCOUNTER — Ambulatory Visit: Payer: Self-pay | Admitting: Gastroenterology

## 2023-06-24 ENCOUNTER — Other Ambulatory Visit: Payer: Self-pay | Admitting: Family Medicine

## 2023-06-24 DIAGNOSIS — I10 Essential (primary) hypertension: Secondary | ICD-10-CM

## 2023-06-28 ENCOUNTER — Ambulatory Visit (INDEPENDENT_AMBULATORY_CARE_PROVIDER_SITE_OTHER): Payer: Self-pay | Admitting: Gastroenterology

## 2023-06-28 ENCOUNTER — Encounter: Payer: Self-pay | Admitting: *Deleted

## 2023-06-28 ENCOUNTER — Encounter: Payer: Self-pay | Admitting: Gastroenterology

## 2023-06-28 VITALS — BP 132/83 | HR 84 | Temp 98.6°F | Ht 65.0 in | Wt 238.4 lb

## 2023-06-28 DIAGNOSIS — D500 Iron deficiency anemia secondary to blood loss (chronic): Secondary | ICD-10-CM

## 2023-06-28 DIAGNOSIS — R1012 Left upper quadrant pain: Secondary | ICD-10-CM

## 2023-06-28 DIAGNOSIS — R7989 Other specified abnormal findings of blood chemistry: Secondary | ICD-10-CM

## 2023-06-28 DIAGNOSIS — K5900 Constipation, unspecified: Secondary | ICD-10-CM

## 2023-06-28 DIAGNOSIS — K219 Gastro-esophageal reflux disease without esophagitis: Secondary | ICD-10-CM

## 2023-06-28 NOTE — Progress Notes (Addendum)
GI Office Note    Referring provider: Danelle Berry PA-C Primary Care Physician:  Alba Cory, MD  Primary Gastroenterologist: formerly Dr. Lannette Donath  Chief Complaint   Chief Complaint  Patient presents with   Gastroesophageal Reflux    Also having issues with left upper quadrant pain and states that she has issues with constipation and diarrhea at times.     History of Present Illness   Lynn Wilson is a 42 y.o. female presenting today at te request of Danelle Berry, PA-C for further evaluation of LUQ pain, abnormal LFTs, GERD. Patient previously seen by Dr. Allegra Lai at [facility], last seen in 2019.   Patient reports history of chronic GERD.  Initially had EGD and flexible sigmoidoscopy by GI in [facility] for bleeding.  She states after her endoscopy she started having reflux symptoms.  Reflux aggravated by certain foods such as tomatoes, sodas, tea, anything with caffeine.  Also aggravated if she has not eaten in a while.  Tries to avoid trigger foods.  Heartburn symptoms fairly well-controlled currently but she does have pressure in the chest and epigastric region, intermittent burping/regurgitation.  Denies dysphagia.  She has had left upper quadrant pain for nearly a year.  Tender to touch around the lower rib cage.  Pain in the left upper quadrant sometimes worse with certain foods.  She has noted it being worse after drinks 4 cans of beer.  She does not drink daily but averages about 1-2 times per week.  Typically drinks 2-3 servings but sometimes more and that is when she notes more left upper quadrant pain.  Bowel movements typically mostly constipation but sometimes intermittent diarrhea.  She notes the more left upper quadrant pain she has some more issues she has with her bowels but this could be either constipation or diarrhea.  She also says she has a history of diverticulitis typically with left mid abdominal pain.  Usually if she goes more than a day without a  bowel movement she will start stool softeners.  Rarely goes more than 1 to 2 days without a stool.  No melena.  Occasional bright red blood per rectum with straining.  Sometimes she has to push on her abdomen to help stool come out or sit backwards on the toilet.  Has been on pantoprazole 40mg  once daily for awhile. Recently PCP increased her PPI to BID. Started on bentyl 20mg  TID prn. Helps little with luq pain and abdominal cramping.  History of IDA, has had intermittent iron infusions in the past.  Last iron infusions about two years ago, cancer center in [place].  She is followed by GYN for history of fibroids/heavy menstrual flow.  Typically has a cycle once per month, the first 3 to 4 days she has heavy bleeding associated with clots.  Has been advised hysterectomy but she is having to wait till she can get time off of work.    Recent labs May 24, 2023: White blood cell count 5300, hemoglobin 11, hematocrit 34.9, MCV 71.8, platelets 415,000, potassium 3.4, sodium 135, BUN 14, creatinine 0.83, total bilirubin 0.5, alkaline phosphatase 76, AST 35, ALT 27, lipase 33.  History of intermittently elevated AST/ALT less than 2 times normal.  In 09/2022, ALT 34, AP 157.  Recent CBC with microcytic anemia.  In July 2023, ferritin 21 up from 7, iron sats 15%.:  RUQ U/S 11/2018: for elevated LFTs showed fatty liver.  EGD and colonoscopy  09/08/2018 - Normal duodenal bulb and second  portion of the duodenum. - Erythematous mucosa in the antrum. - Normal gastric fundus, gastric body and incisura. Biopsied. - Esophagogastric landmarks identified. - Normal gastroesophageal junction and esophagus.   - The examined portion of the ileum was normal. - Severe diverticulosis in the sigmoid colon and in the descending colon. There was no evidence of diverticular Bleeding. - External and internal hemorrhoids. - Normal mucosa in the entire examined colon. - No specimens collected.     DIAGNOSIS:  A.  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  - IHC FOR HELICOBACTER IS NEGATIVE.  - NEGATIVE FOR DYSPLASIA AND MALIGNANCY.    Medications   Current Outpatient Medications  Medication Sig Dispense Refill   amLODipine (NORVASC) 5 MG tablet Take 1 tablet (5 mg total) by mouth daily. 30 tablet 0   baclofen (LIORESAL) 10 MG tablet Take 1 tablet (10 mg total) by mouth 3 (three) times daily. For headaches 30 each 0   dicyclomine (BENTYL) 20 MG tablet Take 1 tablet (20 mg total) by mouth 3 (three) times daily as needed for spasms (abd cramping). 60 tablet 1   hydrochlorothiazide (HYDRODIURIL) 25 MG tablet Take 1 tablet by mouth once daily 30 tablet 0   Iron, Ferrous Sulfate, 325 (65 Fe) MG TABS Take 325 mg by mouth daily. 180 tablet 0   levothyroxine (SYNTHROID) 100 MCG tablet TAKE 1 TABLET BY MOUTH IN THE MORNING 90 tablet 0   pantoprazole (PROTONIX) 40 MG tablet Take 1 tablet (40 mg total) by mouth 2 (two) times daily. 60 tablet 0   Rimegepant Sulfate (NURTEC) 75 MG TBDP Take 1 each by mouth every other day. 16 tablet 2   No current facility-administered medications for this visit.    Allergies   Allergies as of 06/28/2023 - Review Complete 06/28/2023  Allergen Reaction Noted   Celecoxib  03/08/2012   Sesame seed (diagnostic) Itching 01/25/2018   Lisinopril Rash 03/08/2012    Past Medical History   Past Medical History:  Diagnosis Date   GERD (gastroesophageal reflux disease)    Hypertension    Hypothyroid    Hypothyroid 01/25/2018   Iron deficiency anemia due to chronic blood loss 02/16/2018    Past Surgical History   Past Surgical History:  Procedure Laterality Date   COLONOSCOPY WITH PROPOFOL N/A 09/08/2018   Procedure: COLONOSCOPY WITH PROPOFOL;  Surgeon: Toney Reil, MD;  Location: [facility] ENDOSCOPY;  Service: Gastroenterology;  Laterality: N/A;    ESOPHAGOGASTRODUODENOSCOPY (EGD) WITH PROPOFOL N/A 09/08/2018   Procedure: ESOPHAGOGASTRODUODENOSCOPY (EGD) WITH PROPOFOL;  Surgeon: Toney Reil, MD;  Location: [facility] ENDOSCOPY;  Service: Gastroenterology;  Laterality: N/A;    Past Family History   Family History  Problem Relation Age of Onset   Hypertension Mother    Hypertension Father    Stroke Father     Past Social History   Social History   Socioeconomic History   Marital status: Married    Spouse name: [name]   Number of children: 0   Years of education: Not on file   Highest education level: Some college, no degree  Occupational History   Occupation: Pharmacologist: [id number]  Tobacco Use   Smoking status: Every Day    Current packs/day: 0.75    Average packs/day: 0.8 packs/day for 23.4 years (17.6 ttl pk-yrs)    Types: Cigarettes    Start date: 01/26/2000  Passive exposure: Never   Smokeless tobacco: Never  Vaping Use   Vaping status: Never Used  Substance and Sexual Activity   Alcohol use: Yes    Alcohol/week: 2.0 standard drinks of alcohol    Types: 2 Cans of beer per week   Drug use: No   Sexual activity: Yes    Birth control/protection: Other-see comments    Comment: homosexual, injection due to heavy vag. 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 wife had BKA of right leg in 2020   Social Determinants of Health   Financial Resource Strain: Low Risk  (05/24/2023)   Overall Financial Resource Strain (CARDIA)    Difficulty of Paying Living Expenses: Not very hard  Food Insecurity: No Food Insecurity (05/24/2023)   Hunger Vital Sign    Worried About Running Out of Food in the Last Year: Never true    Ran Out of Food in the Last Year: Never true  Transportation Needs: No Transportation Needs (05/24/2023)   PRAPARE -  Administrator, Civil Service (Medical): No    Lack of Transportation (Non-Medical): No  Physical Activity: Inactive (05/24/2023)   Exercise Vital Sign    Days of Exercise per Week: 0 days    Minutes of Exercise per Session: 0 min  Stress: Stress Concern Present (05/24/2023)   [employer] of Occupational Health - Occupational Stress Questionnaire    Feeling of Stress : To some extent  Social Connections: Moderately Integrated (05/24/2023)   Social Connection and Isolation Panel [NHANES]    Frequency of Communication with Friends and Family: Twice a week    Frequency of Social Gatherings with Friends and Family: Twice a week    Attends Religious Services: 1 to 4 times per year    Active Member of [employer] or Organizations: No    Attends Banker Meetings: Never    Marital Status: Married  Catering manager Violence: Not At Risk (07/06/2022)   Humiliation, Afraid, Rape, and Kick questionnaire    Fear of Current or Ex-Partner: No    Emotionally Abused: No    Physically Abused: No    Sexually Abused: No    Review of Systems   General: Negative for anorexia, weight loss, fever, chills, fatigue, weakness. Eyes: Negative for vision changes.  ENT: Negative for hoarseness, difficulty swallowing , nasal congestion. CV: Negative for chest pain, angina, palpitations, dyspnea on exertion, peripheral edema.  Respiratory: Negative for dyspnea at rest, dyspnea on exertion, cough, sputum, wheezing.  GI: See history of present illness. GU:  Negative for dysuria, hematuria, urinary incontinence, urinary frequency, nocturnal urination. See hpi MS: Negative for joint pain, low back pain.  Derm: Negative for rash or itching.  Neuro: Negative for weakness, abnormal sensation, seizure, frequent headaches, memory loss,  confusion.  Psych: Negative for anxiety, depression, suicidal ideation, hallucinations.  Endo: Negative for unusual weight change.  Heme: Negative for bruising or  bleeding. Allergy: Negative for rash or hives.  Physical Exam   BP 132/83 (BP Location: Right Arm, Patient Position: Sitting, Cuff Size: Large)   Pulse 84   Temp 98.6 F (37 C) (Oral)   Ht 5\' 5"  (1.651 m)   Wt 238 lb 6.4 oz (108.1 kg)   LMP 06/10/2023 (Approximate)   SpO2 98%   BMI 39.67 kg/m  General: Well-nourished, well-developed in no acute distress.  Head: Normocephalic, atraumatic.   Eyes: Conjunctiva pink, no icterus. Mouth: Oropharyngeal mucosa moist and pink  Neck: Supple without thyromegaly, masses, or lymphadenopathy.  Lungs: Clear to auscultation bilaterally.  Heart: Regular rate and rhythm, no murmurs rubs or gallops.  Abdomen: Bowel sounds are normal,  nondistended, no hepatosplenomegaly or masses,  no abdominal bruits or hernia, no rebound or guarding.  Epigastric/luq tenderness to deep palpation Rectal: not performed Extremities: No lower extremity edema. No clubbing or deformities.  Neuro: Alert and oriented x 4 , grossly normal neurologically.  Skin: Warm and dry, no rash or jaundice.   Psych: Alert and cooperative, normal mood and affect.  Labs   Lab Results  Component Value Date   NA 135 05/24/2023   CL 101 05/24/2023   K 3.4 (L) 05/24/2023   CO2 24 05/24/2023   BUN 14 05/24/2023   CREATININE 0.83 05/24/2023   GFRNONAA >60 05/24/2023   CALCIUM 9.1 05/24/2023   ALBUMIN 4.2 05/24/2023   GLUCOSE 97 05/24/2023   Lab Results  Component Value Date   ALT 27 05/24/2023   AST 35 05/24/2023   ALKPHOS 76 05/24/2023   BILITOT 0.5 05/24/2023   Lab Results  Component Value Date   WBC 5.3 05/24/2023   HGB 11.0 (L) 05/24/2023   HCT 34.9 (L) 05/24/2023   MCV 71.8 (L) 05/24/2023   PLT 415 (H) 05/24/2023   Lab Results  Component Value Date   LIPASE 33 05/24/2023    Imaging Studies   No results found.  Assessment   *LUQ/epigastric pain *GERD *constipation with intermittent diarrhea *Elevated LFTs *IDA  Etiology of abdominal pain unclear.  Mostly LUQ/epigastric, could be biliary/gastritis/PUD/pancreatic in nature. Recommend CT for further evaluation. Tendency towards constipation, poorly controlled. Occasional brbpr with straining. Likely benign anorectal source. Could consider updating colonoscopy especially if more frequent brbpr. IDA likely due to heavy menses in setting of fibroids, has been advised hysterectomy. Prior IV iron infusions as outlined. History of elevated lfts/fatty liver, most recent lfts normal. Fatty liver instructions provided.   PLAN   Start Miralax one capful BID until soft stool, then continue daily as needed. Bentyl as needed for abd cramping but limit due to constipation Pantoprazole BID. CT A/P with contrast. Cut back on etoh consumption in setting of fatty liver.    Leanna Battles. Melvyn Neth, MHS, PA-C [facility]

## 2023-06-28 NOTE — Patient Instructions (Signed)
Start miralax one capful twice daily until soft regular bowel movements, then continue once daily as needed to maintain soft stools. You may use bentyl (dicyclomine) as needed for abdominal cramping. Caution as this can contribute to constipation so use only as needed and watch for constipation.  Continue pantoprazole twice daily before breakfast and evening meal for now.  CT scan of your abdomen to evaluate your abdominal pain. Cut back on alcohol consumption. Given your fatty liver you should limit alcohol use. Guidelines do suggest no more than 2 drinks per day for a female, but in setting of fatty liver you should consider drinking less.  Instructions for fatty liver: Recommend 1-2# weight loss per week until ideal body weight through exercise & diet. Low fat/cholesterol diet.   Avoid sweets, sodas, fruit juices, sweetened beverages like tea, etc. Gradually increase exercise from 15 min daily up to 1 hr per day 5 days/week. Limit alcohol use.

## 2023-06-29 ENCOUNTER — Other Ambulatory Visit: Payer: Self-pay | Admitting: Family Medicine

## 2023-06-29 DIAGNOSIS — I10 Essential (primary) hypertension: Secondary | ICD-10-CM

## 2023-07-03 ENCOUNTER — Encounter: Payer: Self-pay | Admitting: Gastroenterology

## 2023-07-14 ENCOUNTER — Ambulatory Visit (HOSPITAL_COMMUNITY)
Admission: RE | Admit: 2023-07-14 | Discharge: 2023-07-14 | Disposition: A | Discharge status: Home / Self Care | Payer: Medicaid Other | Source: Ambulatory Visit | Attending: Gastroenterology | Admitting: Gastroenterology

## 2023-07-14 DIAGNOSIS — D500 Iron deficiency anemia secondary to blood loss (chronic): Secondary | ICD-10-CM | POA: Insufficient documentation

## 2023-07-14 DIAGNOSIS — K5900 Constipation, unspecified: Secondary | ICD-10-CM | POA: Insufficient documentation

## 2023-07-14 DIAGNOSIS — R1012 Left upper quadrant pain: Secondary | ICD-10-CM | POA: Insufficient documentation

## 2023-07-14 DIAGNOSIS — K219 Gastro-esophageal reflux disease without esophagitis: Secondary | ICD-10-CM | POA: Insufficient documentation

## 2023-07-14 DIAGNOSIS — R7989 Other specified abnormal findings of blood chemistry: Secondary | ICD-10-CM | POA: Insufficient documentation

## 2023-07-14 MED ORDER — IOHEXOL 300 MG/ML  SOLN
100.0000 mL | Freq: Once | INTRAMUSCULAR | Status: AC | PRN
  Administered 2023-07-14: 100 mL via INTRAVENOUS

## 2023-07-21 ENCOUNTER — Encounter: Payer: Self-pay | Admitting: *Deleted

## 2023-07-21 ENCOUNTER — Other Ambulatory Visit: Payer: Self-pay | Admitting: *Deleted

## 2023-07-21 DIAGNOSIS — R1012 Left upper quadrant pain: Secondary | ICD-10-CM

## 2023-07-21 DIAGNOSIS — K625 Hemorrhage of anus and rectum: Secondary | ICD-10-CM

## 2023-07-21 DIAGNOSIS — K219 Gastro-esophageal reflux disease without esophagitis: Secondary | ICD-10-CM

## 2023-07-21 MED ORDER — PEG 3350-KCL-NA BICARB-NACL 420 G PO SOLR: 4000.0000 mL | Freq: Once | ORAL | 0 refills | Status: AC

## 2023-08-09 ENCOUNTER — Telehealth: Payer: Self-pay | Admitting: *Deleted

## 2023-08-09 NOTE — Telephone Encounter (Signed)
Crystal R Page, RN:   Lynn Wilson. I called her and she said she needs to be rescheduled because she does not have things straight with her insurance.   Called pt and she says it may take a couple of weeks until she gets things straight with insurance and she will call back to reschedule.

## 2023-08-10 ENCOUNTER — Encounter (HOSPITAL_COMMUNITY): Admission: RE | Payer: Self-pay | Source: Home / Self Care

## 2023-08-10 ENCOUNTER — Ambulatory Visit (HOSPITAL_COMMUNITY): Admission: RE | Admit: 2023-08-10 | Payer: Medicaid Other | Source: Home / Self Care

## 2023-08-10 SURGERY — COLONOSCOPY WITH PROPOFOL
Anesthesia: Monitor Anesthesia Care

## 2023-08-20 NOTE — Progress Notes (Unsigned)
Name: Lynn Wilson   MRN: [id number]    DOB: [birth year]   Date:08/23/2023       Progress Note  Subjective  Chief Complaint  Follow Up  HPI  HTN: she is now taking hydrochlorothiazide 25 and norvasc 2.5 mg, she states when taking 5 mg she was feeling weird ( like heart rate was dropping ) but resolved with lower dose. No palpitation, lower extremity edema.  Pre diabetes: she feels hungry all the time, denies polyphagia or polydipsia , we will recheck A1C  Hypothyroidism: she is taking synthroid 100 mcg daily, no change in bowel movements, denies dysphagia, we will recheck TSH  Migraine headaches:she states no longer having daily headaches, just has throbbing, all over her head and intense, it usually the day before her cycle. She states migraines also triggered by skipping meals, chocolate and stress. She is taking Nurtec and prn baclofen  Fibroid tumors and heavy cycle: she needs a hysterectomy , she will contact her gyn to schedule procedure now   Smokers cough: she is still smoking but states has improved, no wheezing. She has mild sob with activity  Left lower extremity edema: recurrent pain on left lower calf, previous doppler negative, she states same pain that comes and goes. Discussed using biofreeze and massage. She states ibuprofen, massage  and leg elevation helps with symptoms   LUQ pain: seen by GI, had CT abdomen pelvis done 07/14/2023 that showed increase in fibroid tumor , diverticulosis and small and stable umbilical hernia. She was supposed to have colonoscopy done end of August   Dyslipidemia: we will recheck labs  The 10-year ASCVD risk score (Arnett DK, et al., 2019) is: 3.2%   Values used to calculate the score:     Age: 42 years     Sex: Female     Is Non-Hispanic African American: Yes     Diabetic: No     Tobacco smoker: Yes     Systolic Blood Pressure: 130 mmHg     Is BP treated: Yes     HDL Cholesterol: 54 mg/dL     Total Cholesterol: 191 mg/dL    Patient Active Problem List   Diagnosis Date Noted   Constipation 06/28/2023   Elevated LFTs 06/28/2023   Pre-diabetes 09/29/2022   Smokers' cough (HCC) 09/29/2022   Symptomatic anemia 01/30/2022   Chronic left shoulder pain 02/22/2019   Chronic gastritis without bleeding 02/22/2019   LUQ pain    Menorrhagia with regular cycle 05/10/2018   Internal bleeding hemorrhoids 05/10/2018   Migraine without aura and responsive to treatment 02/16/2018   Iron deficiency anemia due to chronic blood loss 02/16/2018   Hyperglycemia 01/29/2018   Metabolic syndrome 01/29/2018   Essential hypertension 01/25/2018   GERD (gastroesophageal reflux disease) 01/25/2018   Hypothyroidism, adult 01/25/2018   Morbid obesity (HCC) 01/25/2018    Past Surgical History:  Procedure Laterality Date   COLONOSCOPY WITH PROPOFOL N/A 09/08/2018   Procedure: COLONOSCOPY WITH PROPOFOL;  Surgeon: Toney Reil, MD;  Location: [facility] ENDOSCOPY;  Service: Gastroenterology;  Laterality: N/A;   ESOPHAGOGASTRODUODENOSCOPY (EGD) WITH PROPOFOL N/A 09/08/2018   Procedure: ESOPHAGOGASTRODUODENOSCOPY (EGD) WITH PROPOFOL;  Surgeon: Toney Reil, MD;  Location: [facility] ENDOSCOPY;  Service: Gastroenterology;  Laterality: N/A;    Family History  Problem Relation Age of Onset   Hypertension Mother    Hypertension Father    Stroke Father     Social History   Tobacco Use   Smoking status: Every Day  Current packs/day: 0.75    Average packs/day: 0.8 packs/day for 23.6 years (17.7 ttl pk-yrs)    Types: Cigarettes    Start date: 01/26/2000    Passive exposure: Never   Smokeless tobacco: Never  Substance Use Topics   Alcohol use: Yes    Alcohol/week: 2.0 standard drinks of alcohol    Types: 2 Cans of beer per week     Current Outpatient Medications:    dicyclomine (BENTYL) 20 MG tablet, Take 1 tablet (20 mg total) by mouth 3 (three) times daily as needed for spasms (abd cramping)., Disp: 60 tablet, Rfl: 1    Iron, Ferrous Sulfate, 325 (65 Fe) MG TABS, Take 325 mg by mouth daily., Disp: 180 tablet, Rfl: 0   levothyroxine (SYNTHROID) 100 MCG tablet, TAKE 1 TABLET BY MOUTH IN THE MORNING, Disp: 90 tablet, Rfl: 0   amLODipine (NORVASC) 2.5 MG tablet, Take 1 tablet (2.5 mg total) by mouth daily., Disp: 90 tablet, Rfl: 1   baclofen (LIORESAL) 10 MG tablet, Take 1 tablet (10 mg total) by mouth 3 (three) times daily. For headaches, Disp: 30 each, Rfl: 0   hydrochlorothiazide (HYDRODIURIL) 25 MG tablet, Take 1 tablet (25 mg total) by mouth daily., Disp: 90 tablet, Rfl: 0   pantoprazole (PROTONIX) 40 MG tablet, Take 1 tablet (40 mg total) by mouth 2 (two) times daily., Disp: 60 tablet, Rfl: 0   Rimegepant Sulfate (NURTEC) 75 MG TBDP, Take 1 tablet (75 mg total) by mouth every other day., Disp: 16 tablet, Rfl: 2  Allergies  Allergen Reactions   Celecoxib     "shakes/tremors" Other reaction(s): other   Sesame Seed (Diagnostic) Itching   Lisinopril Rash    I personally reviewed active problem list, medication list, allergies, family history, social history, health maintenance with the patient/caregiver today.   ROS  Constitutional: Negative for fever or weight change.  Respiratory: positive for intermittent cough and  shortness of breath.   Cardiovascular: Negative for chest pain or palpitations.  Gastrointestinal: Negative for abdominal pain, no bowel changes.  Musculoskeletal: Negative for gait problem or joint swelling.  Skin: Negative for rash.  Neurological: Negative for dizziness , positive for intermittent  headache.  No other specific complaints in a complete review of systems (except as listed in HPI above).   Objective  Vitals:   08/23/23 1239  BP: 130/76  Pulse: 90  Resp: 16  SpO2: 100%  Weight: 241 lb (109.3 kg)  Height: 5\' 5"  (1.651 m)    Body mass index is 40.1 kg/m.  Physical Exam  Constitutional: Patient appears well-developed and well-nourished. Obese  No distress.   HEENT: head atraumatic, normocephalic, pupils equal and reactive to light, neck supple Cardiovascular: Normal rate, regular rhythm and normal heart sounds.  No murmur heard. No BLE edema. Pulmonary/Chest: Effort normal and breath sounds normal. No respiratory distress. Abdominal: Soft.  There is no tenderness. Psychiatric: Patient has a normal mood and affect. behavior is normal. Judgment and thought content normal.    PHQ2/9:    08/23/2023   12:39 PM 05/24/2023   11:17 AM 09/29/2022   11:01 AM 07/06/2022    9:51 AM 04/10/2022    8:35 AM  Depression screen PHQ 2/9  Decreased Interest 1 0 0 0 0  Down, Depressed, Hopeless 0 0 0 0 0  PHQ - 2 Score 1 0 0 0 0  Altered sleeping 0  0 0 0  Tired, decreased energy 1  0 0 0  Change in appetite 1  0 0 0  Feeling bad or failure about yourself  0  0 0 0  Trouble concentrating 0  0 0 0  Moving slowly or fidgety/restless 0  0 0 0  Suicidal thoughts 0  0 0 0  PHQ-9 Score 3  0 0 0    phq 9 is negative    Fall Risk:    08/23/2023   12:39 PM 05/24/2023   11:17 AM 09/29/2022   11:01 AM 07/06/2022    9:51 AM 04/10/2022    8:35 AM  Fall Risk   Falls in the past year? 0 0 0 0 0  Number falls in past yr: 0 0  0 0  Injury with Fall? 0 0  0 0  Risk for fall due to : No Fall Risks No Fall Risks No Fall Risks No Fall Risks No Fall Risks  Follow up Falls prevention discussed Falls prevention discussed Falls prevention discussed;Education provided;Falls evaluation completed Falls prevention discussed Falls prevention discussed      Functional Status Survey: Is the patient deaf or have difficulty hearing?: No Does the patient have difficulty seeing, even when wearing glasses/contacts?: No Does the patient have difficulty concentrating, remembering, or making decisions?: No Does the patient have difficulty walking or climbing stairs?: No Does the patient have difficulty dressing or bathing?: No Does the patient have difficulty doing errands alone such  as visiting a doctor's office or shopping?: No    Assessment & Plan  1. Smokers' cough (HCC)  Stable, still smoking but states getting better   2. Essential hypertension  - amLODipine (NORVASC) 2.5 MG tablet; Take 1 tablet (2.5 mg total) by mouth daily.  Dispense: 90 tablet; Refill: 1 - hydrochlorothiazide (HYDRODIURIL) 25 MG tablet; Take 1 tablet (25 mg total) by mouth daily.  Dispense: 90 tablet; Refill: 0 - Comp. Metabolic Panel (12)  3. Hypothyroidism, adult  - TSH  4. Gastroesophageal reflux disease without esophagitis  Taking  PPI and seeing GI  5. Migraine without aura and responsive to treatment  - baclofen (LIORESAL) 10 MG tablet; Take 1 tablet (10 mg total) by mouth 3 (three) times daily. For headaches  Dispense: 30 each; Refill: 0 - Rimegepant Sulfate (NURTEC) 75 MG TBDP; Take 1 tablet (75 mg total) by mouth every other day.  Dispense: 16 tablet; Refill: 2  6. Secondary dysmenorrhea  Has fibroid tumors that has grown , she will follow up with gyn   7. Migraine without aura and with status migrainosus, not intractable  Stable   8. Pre-diabetes  - Hemoglobin A1c  9. Dyslipidemia  - Lipid panel  10. History of iron deficiency anemia  - CBC with Differential/Platelet - Iron, TIBC and Ferritin Panel  11. Needs flu shot  - Flu vaccine trivalent PF, 6mos and older(Flulaval,Afluria,Fluarix,Fluzone)

## 2023-08-23 ENCOUNTER — Encounter: Payer: Self-pay | Admitting: Family Medicine

## 2023-08-23 ENCOUNTER — Ambulatory Visit (INDEPENDENT_AMBULATORY_CARE_PROVIDER_SITE_OTHER): Payer: Medicaid Other | Admitting: Family Medicine

## 2023-08-23 VITALS — BP 130/76 | HR 90 | Resp 16 | Ht 65.0 in | Wt 241.0 lb

## 2023-08-23 DIAGNOSIS — N945 Secondary dysmenorrhea: Secondary | ICD-10-CM

## 2023-08-23 DIAGNOSIS — I10 Essential (primary) hypertension: Secondary | ICD-10-CM

## 2023-08-23 DIAGNOSIS — J410 Simple chronic bronchitis: Secondary | ICD-10-CM

## 2023-08-23 DIAGNOSIS — K219 Gastro-esophageal reflux disease without esophagitis: Secondary | ICD-10-CM | POA: Diagnosis not present

## 2023-08-23 DIAGNOSIS — Z23 Encounter for immunization: Secondary | ICD-10-CM | POA: Diagnosis not present

## 2023-08-23 DIAGNOSIS — Z862 Personal history of diseases of the blood and blood-forming organs and certain disorders involving the immune mechanism: Secondary | ICD-10-CM

## 2023-08-23 DIAGNOSIS — G43001 Migraine without aura, not intractable, with status migrainosus: Secondary | ICD-10-CM

## 2023-08-23 DIAGNOSIS — R7303 Prediabetes: Secondary | ICD-10-CM

## 2023-08-23 DIAGNOSIS — E785 Hyperlipidemia, unspecified: Secondary | ICD-10-CM

## 2023-08-23 DIAGNOSIS — E039 Hypothyroidism, unspecified: Secondary | ICD-10-CM

## 2023-08-23 DIAGNOSIS — G43009 Migraine without aura, not intractable, without status migrainosus: Secondary | ICD-10-CM

## 2023-08-23 MED ORDER — AMLODIPINE BESYLATE 2.5 MG PO TABS: 2.5000 mg | ORAL_TABLET | Freq: Every day | ORAL | 1 refills | Status: AC

## 2023-08-23 MED ORDER — BACLOFEN 10 MG PO TABS: 10.0000 mg | ORAL_TABLET | Freq: Three times a day (TID) | ORAL | 0 refills | Status: AC

## 2023-08-23 MED ORDER — HYDROCHLOROTHIAZIDE 25 MG PO TABS
25.0000 mg | ORAL_TABLET | Freq: Every day | ORAL | 0 refills | Status: DC
Start: 2023-08-23 — End: 2024-01-17

## 2023-08-23 MED ORDER — NURTEC 75 MG PO TBDP
1.0000 | ORAL_TABLET | ORAL | 2 refills | Status: DC
Start: 2023-08-23 — End: 2023-11-08

## 2023-08-24 ENCOUNTER — Encounter: Payer: Self-pay | Admitting: Internal Medicine

## 2023-08-24 ENCOUNTER — Encounter: Payer: Self-pay | Admitting: Family Medicine

## 2023-08-24 LAB — HEMOGLOBIN A1C
Est. average glucose Bld gHb Est-mCnc: 137 mg/dL
Hgb A1c MFr Bld: 6.4 % — ABNORMAL HIGH (ref 4.8–5.6)

## 2023-08-24 LAB — LIPID PANEL
Chol/HDL Ratio: 3.9 ratio (ref 0.0–4.4)
Cholesterol, Total: 205 mg/dL — ABNORMAL HIGH (ref 100–199)
HDL: 52 mg/dL (ref >39)
LDL Chol Calc (NIH): 115 mg/dL — ABNORMAL HIGH (ref 0–99)
Triglycerides: 218 mg/dL — ABNORMAL HIGH (ref 0–149)
VLDL Cholesterol Cal: 38 mg/dL (ref 5–40)

## 2023-08-24 LAB — IRON,TIBC AND FERRITIN PANEL
Ferritin: 12 ng/mL — ABNORMAL LOW (ref 15–150)
Iron Saturation: 13 % — ABNORMAL LOW (ref 15–55)
Iron: 63 ug/dL (ref 27–159)
Total Iron Binding Capacity: 486 ug/dL — ABNORMAL HIGH (ref 250–450)
UIBC: 423 ug/dL (ref 131–425)

## 2023-08-24 LAB — CBC WITH DIFFERENTIAL/PLATELET
Basophils Absolute: 0 10*3/uL (ref 0.0–0.2)
Basos: 0 %
EOS (ABSOLUTE): 0.1 10*3/uL (ref 0.0–0.4)
Eos: 1 %
Hematocrit: 36.6 % (ref 34.0–46.6)
Hemoglobin: 11 g/dL — ABNORMAL LOW (ref 11.1–15.9)
Immature Grans (Abs): 0 10*3/uL (ref 0.0–0.1)
Immature Granulocytes: 0 %
Lymphocytes Absolute: 3.1 10*3/uL (ref 0.7–3.1)
Lymphs: 38 %
MCH: 22 pg — ABNORMAL LOW (ref 26.6–33.0)
MCHC: 30.1 g/dL — ABNORMAL LOW (ref 31.5–35.7)
MCV: 73 fL — ABNORMAL LOW (ref 79–97)
Monocytes Absolute: 0.8 10*3/uL (ref 0.1–0.9)
Monocytes: 9 %
Neutrophils Absolute: 4.1 10*3/uL (ref 1.4–7.0)
Neutrophils: 52 %
Platelets: 421 10*3/uL (ref 150–450)
RBC: 5.01 x10E6/uL (ref 3.77–5.28)
RDW: 18 % — ABNORMAL HIGH (ref 11.7–15.4)
WBC: 8.1 10*3/uL (ref 3.4–10.8)

## 2023-08-24 LAB — COMP. METABOLIC PANEL (12)
AST: 27 IU/L (ref 0–40)
Albumin: 4.4 g/dL (ref 3.9–4.9)
Alkaline Phosphatase: 105 IU/L (ref 44–121)
BUN/Creatinine Ratio: 14 (ref 9–23)
BUN: 12 mg/dL (ref 6–24)
Bilirubin Total: <0.2 mg/dL (ref 0.0–1.2)
Calcium: 9.3 mg/dL (ref 8.7–10.2)
Chloride: 100 mmol/L (ref 96–106)
Creatinine, Ser: 0.86 mg/dL (ref 0.57–1.00)
Globulin, Total: 3.5 g/dL (ref 1.5–4.5)
Glucose: 101 mg/dL — ABNORMAL HIGH (ref 70–99)
Potassium: 4.4 mmol/L (ref 3.5–5.2)
Sodium: 137 mmol/L (ref 134–144)
Total Protein: 7.9 g/dL (ref 6.0–8.5)
eGFR: 88 mL/min/{1.73_m2} (ref >59)

## 2023-08-24 LAB — TSH: TSH: 3.61 u[IU]/mL (ref 0.450–4.500)

## 2023-09-21 ENCOUNTER — Other Ambulatory Visit: Payer: Self-pay | Admitting: Family Medicine

## 2023-09-21 DIAGNOSIS — E039 Hypothyroidism, unspecified: Secondary | ICD-10-CM

## 2023-10-04 ENCOUNTER — Encounter: Payer: Self-pay | Admitting: Internal Medicine

## 2023-10-04 ENCOUNTER — Ambulatory Visit (INDEPENDENT_AMBULATORY_CARE_PROVIDER_SITE_OTHER): Payer: BC Managed Care – PPO | Admitting: Obstetrics & Gynecology

## 2023-10-04 ENCOUNTER — Encounter: Payer: Self-pay | Admitting: Obstetrics & Gynecology

## 2023-10-04 VITALS — BP 129/85 | HR 81 | Ht 65.0 in | Wt 242.0 lb

## 2023-10-04 DIAGNOSIS — Z30430 Encounter for insertion of intrauterine contraceptive device: Secondary | ICD-10-CM

## 2023-10-04 DIAGNOSIS — Z1231 Encounter for screening mammogram for malignant neoplasm of breast: Secondary | ICD-10-CM

## 2023-10-04 DIAGNOSIS — N920 Excessive and frequent menstruation with regular cycle: Secondary | ICD-10-CM

## 2023-10-04 DIAGNOSIS — Z01419 Encounter for gynecological examination (general) (routine) without abnormal findings: Secondary | ICD-10-CM | POA: Diagnosis not present

## 2023-10-04 MED ORDER — LEVONORGESTREL 20 MCG/DAY IU IUD
1.0000 | INTRAUTERINE_SYSTEM | Freq: Once | INTRAUTERINE | Status: AC
Start: 2023-10-04 — End: 2023-10-04
  Administered 2023-10-04: 1 via INTRAUTERINE

## 2023-10-04 NOTE — Addendum Note (Signed)
Addended by: Freddie Apley R on: 10/04/2023 12:53 PM   Modules accepted: Orders

## 2023-10-04 NOTE — Progress Notes (Signed)
Subjective:     Lynn Wilson is a 42 y.o. female here for a routine exam.  Patient's last menstrual period was 09/26/2023 (exact date). [id number] Birth Control Method:  none Menstrual Calendar(currently): regular 7 days, heavy clots crampy-->getting worse, sonogram 01/2022 234 cc uterine voume small fibroids  Current complaints: periods, will try IUD, mirena.   Current acute medical issues:     Recent Gynecologic History Patient's last menstrual period was 09/26/2023 (exact date). Last Pap: 02/2022,  normal Last mammogram: na,  ordered, pt to schedule  Past Medical History:  Diagnosis Date   GERD (gastroesophageal reflux disease)    Hypertension    Hypothyroid    Hypothyroid 01/25/2018   Iron deficiency anemia due to chronic blood loss 02/16/2018    Past Surgical History:  Procedure Laterality Date   COLONOSCOPY WITH PROPOFOL N/A 09/08/2018   Procedure: COLONOSCOPY WITH PROPOFOL;  Surgeon: Toney Reil, MD;  Location: [facility] ENDOSCOPY;  Service: Gastroenterology;  Laterality: N/A;   ESOPHAGOGASTRODUODENOSCOPY (EGD) WITH PROPOFOL N/A 09/08/2018   Procedure: ESOPHAGOGASTRODUODENOSCOPY (EGD) WITH PROPOFOL;  Surgeon: Toney Reil, MD;  Location: [facility] ENDOSCOPY;  Service: Gastroenterology;  Laterality: N/A;    OB History     Gravida  0   Para  0   Term  0   Preterm  0   AB  0   Living  0      SAB  0   IAB  0   Ectopic  0   Multiple  0   Live Births  0           Social History   Socioeconomic History   Marital status: Married    Spouse name: Engineer, building services   Number of children: 0   Years of education: Not on file   Highest education level: Some college, no degree  Occupational History   Occupation: Pharmacologist: [employer]  Tobacco Use   Smoking status: Every Day    Current packs/day: 0.75    Average packs/day: 0.7 packs/day for 23.7 years (17.8 ttl pk-yrs)    Types: Cigarettes    Start date: 01/26/2000    Passive exposure:  Never   Smokeless tobacco: Never  Vaping Use   Vaping status: Never Used  Substance and Sexual Activity   Alcohol use: Yes    Alcohol/week: 2.0 standard drinks of alcohol    Types: 2 Cans of beer per week   Drug use: No   Sexual activity: Yes    Birth control/protection: Other-see comments    Comment: Homosexual  Other Topics Concern   Not on file  Social History Narrative   She moved to [place] from [place], [place] in the Summer of 2018; She lives with her wife She is works at [employer]ch[employer], retail, [employer]. wife had BKA of right leg in 2020   Social Determinants of Health   Financial Resource Strain: Low Risk  (05/24/2023)   Overall Financial Resource Strain (CARDIA)    Difficulty of Paying Living Expenses: Not very hard  Food Insecurity: No Food Insecurity (05/24/2023)   Hunger Vital Sign    Worried About Running Out of Food in the Last Year: Never true    Ran Out of Food in the Last Year: Never true  Transportation Needs: No Transportation Needs (05/24/2023)   PRAPARE - Administrator, Civil Service (Medical): No    Lack of Transportation (Non-Medical): No  Physical Activity: Inactive (05/24/2023)   Exercise Vital Sign  Days of Exercise per Week: 0 days    Minutes of Exercise per Session: 0 min  Stress: Stress Concern Present (05/24/2023)   [employer] of Occupational Health - Occupational Stress Questionnaire    Feeling of Stress : To some extent  Social Connections: Moderately Integrated (05/24/2023)   Social Connection and Isolation Panel [NHANES]    Frequency of Communication with Friends and Family: Twice a week    Frequency of Social Gatherings with Friends and Family: Twice a week    Attends Religious Services: 1 to 4 times per year    Active Member of [employer] or Organizations: No    Attends Engineer, structural: Not on file    Marital Status: Married    Family History  Problem Relation Age of Onset   Hypertension Mother    Hypertension  Father    Stroke Father      Current Outpatient Medications:    amLODipine (NORVASC) 2.5 MG tablet, Take 1 tablet (2.5 mg total) by mouth daily., Disp: 90 tablet, Rfl: 1   baclofen (LIORESAL) 10 MG tablet, Take 1 tablet (10 mg total) by mouth 3 (three) times daily. For headaches, Disp: 30 each, Rfl: 0   dicyclomine (BENTYL) 20 MG tablet, Take 1 tablet (20 mg total) by mouth 3 (three) times daily as needed for spasms (abd cramping)., Disp: 60 tablet, Rfl: 1   hydrochlorothiazide (HYDRODIURIL) 25 MG tablet, Take 1 tablet (25 mg total) by mouth daily., Disp: 90 tablet, Rfl: 0   Iron, Ferrous Sulfate, 325 (65 Fe) MG TABS, Take 325 mg by mouth daily., Disp: 180 tablet, Rfl: 0   levothyroxine (SYNTHROID) 100 MCG tablet, TAKE 1 TABLET BY MOUTH IN THE MORNING, Disp: 90 tablet, Rfl: 0   pantoprazole (PROTONIX) 40 MG tablet, Take 1 tablet (40 mg total) by mouth 2 (two) times daily., Disp: 60 tablet, Rfl: 0   Rimegepant Sulfate (NURTEC) 75 MG TBDP, Take 1 tablet (75 mg total) by mouth every other day. (Patient not taking: Reported on 10/04/2023), Disp: 16 tablet, Rfl: 2  Review of Systems  Review of Systems  Constitutional: Negative for fever, chills, weight loss, malaise/fatigue and diaphoresis.  HENT: Negative for hearing loss, ear pain, nosebleeds, congestion, sore throat, neck pain, tinnitus and ear discharge.   Eyes: Negative for blurred vision, double vision, photophobia, pain, discharge and redness.  Respiratory: Negative for cough, hemoptysis, sputum production, shortness of breath, wheezing and stridor.   Cardiovascular: Negative for chest pain, palpitations, orthopnea, claudication, leg swelling and PND.  Gastrointestinal: negative for abdominal pain. Negative for heartburn, nausea, vomiting, diarrhea, constipation, blood in stool and melena.  Genitourinary: Negative for dysuria, urgency, frequency, hematuria and flank pain.  Musculoskeletal: Negative for myalgias, back pain, joint pain and  falls.  Skin: Negative for itching and rash.  Neurological: Negative for dizziness, tingling, tremors, sensory change, speech change, focal weakness, seizures, loss of consciousness, weakness and headaches.  Endo/Heme/Allergies: Negative for environmental allergies and polydipsia. Does not bruise/bleed easily.  Psychiatric/Behavioral: Negative for depression, suicidal ideas, hallucinations, memory loss and substance abuse. The patient is not nervous/anxious and does not have insomnia.        Objective:  Blood pressure 129/85, pulse 81, height 5\' 5"  (1.651 m), weight 242 lb (109.8 kg), last menstrual period 09/26/2023.   Physical Exam  Vitals reviewed. Constitutional: She is oriented to person, place, and time. She appears well-developed and well-nourished.  HENT:  Head: Normocephalic and atraumatic.        Right Ear: External  ear normal.  Left Ear: External ear normal.  Nose: Nose normal.  Mouth/Throat: Oropharynx is clear and moist.  Eyes: Conjunctivae and EOM are normal. Pupils are equal, round, and reactive to light. Right eye exhibits no discharge. Left eye exhibits no discharge. No scleral icterus.  Neck: Normal range of motion. Neck supple. No tracheal deviation present. No thyromegaly present.  Cardiovascular: Normal rate, regular rhythm, normal heart sounds and intact distal pulses.  Exam reveals no gallop and no friction rub.   No murmur heard. Respiratory: Effort normal and breath sounds normal. No respiratory distress. She has no wheezes. She has no rales. She exhibits no tenderness.  GI: Soft. Bowel sounds are normal. She exhibits no distension and no mass. There is no tenderness. There is no rebound and no guarding.  Genitourinary:  Breasts no masses skin changes or nipple changes bilaterally      Vulva is normal without lesions Vagina is pink moist without discharge Cervix normal in appearance and pap is not done, normal 2023 Uterus is normal size shape and contour Adnexa  is negative with normal sized ovaries   Musculoskeletal: Normal range of motion. She exhibits no edema and no tenderness.  Neurological: She is alert and oriented to person, place, and time. She has normal reflexes. She displays normal reflexes. No cranial nerve deficit. She exhibits normal muscle tone. Coordination normal.  Skin: Skin is warm and dry. No rash noted. No erythema. No pallor.  Psychiatric: She has a normal mood and affect. Her behavior is normal. Judgment and thought content normal.       Medications Ordered at today's visit: No orders of the defined types were placed in this encounter.   Other orders placed at today's visit: Orders Placed This Encounter  Procedures   MM 3D SCREENING MAMMOGRAM BILATERAL BREAST   IUD Insertion Procedure Note  Pre-operative Diagnosis: menorrhagia with dysmenorrhea                                            Fibroid uterus 234 cc on sonogram  Post-operative Diagnosis: normal  Indications: contraception  Procedure Details  Urine pregnancy test was not done.  Pt is abstinent since age 60 years old. The risks (including infection, bleeding, pain, and uterine perforation) and benefits of the procedure were explained to the patient and Written informed consent was obtained.    Cervix cleansed with Betadine. Uterus sounded to 9 cm. IUD inserted without difficulty. String visible and trimmed. Patient tolerated procedure well.  IUD Information: Mirena, Lot # [id number], Expiration date 07/2025.  Condition: Stable  Complications: None  Plan:  The patient was advised to call for any fever or for prolonged or severe pain or bleeding. She was advised to use OTC analgesics as needed for mild to moderate pain.   Attending Physician Documentation: I placed the IUD   Assessment:    Normal Gyn exam.   Menorrhagia with dysmenorrhea: Mirena IUD placed for cycle control/management Plan:    Contraception: IUD. Mammogram ordered. Follow up  in: 1 year.     Return in about 1 year (around 10/03/2024) for yearly.

## 2023-10-18 ENCOUNTER — Encounter: Payer: Self-pay | Admitting: Internal Medicine

## 2023-10-31 NOTE — Progress Notes (Deleted)
GI Office Note    Referring Provider: Alba Cory, MD Primary Care Physician:  Alba Cory, MD  Primary Gastroenterologist: Hennie Duos. Marletta Lor, DO   Chief Complaint   No chief complaint on file.   History of Present Illness   Lynn Wilson is a 42 y.o. female presenting today    H/o LUQ pain, abnormal LFTs, GERD, constipation, IDA.     History of intermittently elevated AST/ALT less than 2 times normal.  In 09/2022, ALT 34, AP 157.   Recent CBC with microcytic anemia.  In July 2023, ferritin 21 up from 7, iron sats 15%.:   CT A/P with contrast 06/2023: IMPRESSION: -No acute findings within the abdomen or pelvis. -Colonic diverticulosis, without radiographic evidence of diverticulitis. -Increased size of uterine fibroids, largest measuring 5.5 cm. -Stable small umbilical hernia, which contains only fat.  RUQ U/S 11/2018: for elevated LFTs showed fatty liver.   EGD and colonoscopy  09/08/2018 - Normal duodenal bulb and second portion of the duodenum. - Erythematous mucosa in the antrum. - Normal gastric fundus, gastric body and incisura. Biopsied. - Esophagogastric landmarks identified. - Normal gastroesophageal junction and esophagus.   - The examined portion of the ileum was normal. - Severe diverticulosis in the sigmoid colon and in the descending colon. There was no evidence of diverticular Bleeding. - External and internal hemorrhoids. - Normal mucosa in the entire examined colon. - No specimens collected.     DIAGNOSIS:  A. 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  - IHC FOR HELICOBACTER IS NEGATIVE.  - NEGATIVE FOR DYSPLASIA AND MALIGNANCY.       Medications   Current Outpatient Medications  Medication Sig Dispense Refill   amLODipine (NORVASC) 2.5 MG tablet Take 1 tablet (2.5 mg total) by mouth daily. 90 tablet 1   baclofen  (LIORESAL) 10 MG tablet Take 1 tablet (10 mg total) by mouth 3 (three) times daily. For headaches 30 each 0   dicyclomine (BENTYL) 20 MG tablet Take 1 tablet (20 mg total) by mouth 3 (three) times daily as needed for spasms (abd cramping). 60 tablet 1   hydrochlorothiazide (HYDRODIURIL) 25 MG tablet Take 1 tablet (25 mg total) by mouth daily. 90 tablet 0   Iron, Ferrous Sulfate, 325 (65 Fe) MG TABS Take 325 mg by mouth daily. 180 tablet 0   levothyroxine (SYNTHROID) 100 MCG tablet TAKE 1 TABLET BY MOUTH IN THE MORNING 90 tablet 0   pantoprazole (PROTONIX) 40 MG tablet Take 1 tablet (40 mg total) by mouth 2 (two) times daily. 60 tablet 0   Rimegepant Sulfate (NURTEC) 75 MG TBDP Take 1 tablet (75 mg total) by mouth every other day. (Patient not taking: Reported on 10/04/2023) 16 tablet 2   No current facility-administered medications for this visit.    Allergies   Allergies as of 11/01/2023 - Review Complete 10/04/2023  Allergen Reaction Noted   Celecoxib  03/08/2012   Sesame seed (diagnostic) Itching 01/25/2018   Lisinopril Rash 03/08/2012     Past Medical History   Past Medical History:  Diagnosis Date   GERD (gastroesophageal reflux disease)    Hypertension    Hypothyroid    Hypothyroid 01/25/2018   Iron deficiency anemia due to chronic blood loss 02/16/2018    Past Surgical History   Past Surgical History:  Procedure Laterality Date   COLONOSCOPY WITH PROPOFOL N/A 09/08/2018   Procedure:  COLONOSCOPY WITH PROPOFOL;  Surgeon: Toney Reil, MD;  Location: [facility] ENDOSCOPY;  Service: Gastroenterology;  Laterality: N/A;   ESOPHAGOGASTRODUODENOSCOPY (EGD) WITH PROPOFOL N/A 09/08/2018   Procedure: ESOPHAGOGASTRODUODENOSCOPY (EGD) WITH PROPOFOL;  Surgeon: Toney Reil, MD;  Location: [facility] ENDOSCOPY;  Service: Gastroenterology;  Laterality: N/A;    Past Family History   Family History  Problem Relation Age of Onset   Hypertension Mother    Hypertension Father    Stroke  Father     Past Social History   Social History   Socioeconomic History   Marital status: Married    Spouse name: [name]   Number of children: 0   Years of education: Not on file   Highest education level: Some college, no degree  Occupational History   Occupation: Pharmacologist: [employer]  Tobacco Use   Smoking status: Every Day    Current packs/day: 0.75    Average packs/day: 0.7 packs/day for 23.8 years (17.8 ttl pk-yrs)    Types: Cigarettes    Start date: 01/26/2000    Passive exposure: Never   Smokeless tobacco: Never  Vaping Use   Vaping status: Never Used  Substance and Sexual Activity   Alcohol use: Yes    Alcohol/week: 2.0 standard drinks of alcohol    Types: 2 Cans of beer per week   Drug use: No   Sexual activity: Yes    Birth control/protection: Other-see comments    Comment: Homosexual  Other Topics Concern   Not on file  Social History Narrative   She moved to [place] from [place], [place] in the Summer of 2018; She lives with her wife She is works at [employer], retail, [employer]. wife had BKA of right leg in 2020   Social Determinants of Health   Financial Resource Strain: Low Risk  (10/04/2023)   Overall Financial Resource Strain (CARDIA)    Difficulty of Paying Living Expenses: Not hard at all  Food Insecurity: No Food Insecurity (10/04/2023)   Hunger Vital Sign    Worried About Running Out of Food in the Last Year: Never true    Ran Out of Food in the Last Year: Never true  Transportation Needs: No Transportation Needs (10/04/2023)   PRAPARE - Administrator, Civil Service (Medical): No    Lack of Transportation (Non-Medical): No  Physical Activity: Inactive (10/04/2023)   Exercise Vital Sign    Days of Exercise per Week: 0 days    Minutes of Exercise per Session: 0 min  Stress: Stress Concern Present (05/24/2023)   [employer] of Occupational Health - Occupational Stress Questionnaire    Feeling of Stress : To some  extent  Social Connections: Moderately Integrated (10/04/2023)   Social Connection and Isolation Panel [NHANES]    Frequency of Communication with Friends and Family: Twice a week    Frequency of Social Gatherings with Friends and Family: Twice a week    Attends Religious Services: 1 to 4 times per year    Active Member of [employer] or Organizations: Patient declined    Attends Banker Meetings: Never    Marital Status: Married  Catering manager Violence: Not At Risk (10/04/2023)   Humiliation, Afraid, Rape, and Kick questionnaire    Fear of Current or Ex-Partner: No    Emotionally Abused: No    Physically Abused: No    Sexually Abused: No    Review of Systems   General: Negative for anorexia, weight loss, fever, chills, fatigue,  weakness. ENT: Negative for hoarseness, difficulty swallowing , nasal congestion. CV: Negative for chest pain, angina, palpitations, dyspnea on exertion, peripheral edema.  Respiratory: Negative for dyspnea at rest, dyspnea on exertion, cough, sputum, wheezing.  GI: See history of present illness. GU:  Negative for dysuria, hematuria, urinary incontinence, urinary frequency, nocturnal urination.  Endo: Negative for unusual weight change.     Physical Exam   LMP 09/26/2023 (Exact Date)    General: Well-nourished, well-developed in no acute distress.  Eyes: No icterus. Mouth: Oropharyngeal mucosa moist and pink , no lesions erythema or exudate. Lungs: Clear to auscultation bilaterally.  Heart: Regular rate and rhythm, no murmurs rubs or gallops.  Abdomen: Bowel sounds are normal, nontender, nondistended, no hepatosplenomegaly or masses,  no abdominal bruits or hernia , no rebound or guarding.  Rectal: ***  Extremities: No lower extremity edema. No clubbing or deformities. Neuro: Alert and oriented x 4   Skin: Warm and dry, no jaundice.   Psych: Alert and cooperative, normal mood and affect.  Labs   *** Imaging Studies   No results  found.  Assessment       PLAN   ***   Leanna Battles. Melvyn Neth, MHS, PA-C [facility]

## 2023-11-01 ENCOUNTER — Ambulatory Visit: Payer: BC Managed Care – PPO | Admitting: Gastroenterology

## 2023-11-08 ENCOUNTER — Ambulatory Visit (INDEPENDENT_AMBULATORY_CARE_PROVIDER_SITE_OTHER): Payer: BC Managed Care – PPO | Admitting: Gastroenterology

## 2023-11-08 ENCOUNTER — Encounter: Payer: Self-pay | Admitting: Internal Medicine

## 2023-11-08 ENCOUNTER — Encounter: Payer: Self-pay | Admitting: Gastroenterology

## 2023-11-08 VITALS — BP 142/86 | HR 67 | Temp 98.7°F | Ht 65.0 in | Wt 243.0 lb

## 2023-11-08 DIAGNOSIS — K625 Hemorrhage of anus and rectum: Secondary | ICD-10-CM | POA: Diagnosis not present

## 2023-11-08 DIAGNOSIS — K219 Gastro-esophageal reflux disease without esophagitis: Secondary | ICD-10-CM

## 2023-11-08 DIAGNOSIS — D509 Iron deficiency anemia, unspecified: Secondary | ICD-10-CM

## 2023-11-08 DIAGNOSIS — D500 Iron deficiency anemia secondary to blood loss (chronic): Secondary | ICD-10-CM

## 2023-11-08 DIAGNOSIS — K5900 Constipation, unspecified: Secondary | ICD-10-CM

## 2023-11-08 NOTE — Patient Instructions (Signed)
For constipation: take miralax one capful daily as needed to keep stools soft and regular. Colonoscopy to be scheduled.

## 2023-11-08 NOTE — Progress Notes (Signed)
GI Office Note    Referring Provider: Alba Cory, MD Primary Care Physician:  Alba Cory, MD  Primary Gastroenterologist: Hennie Duos. Marletta Lor, DO   Chief Complaint   Chief Complaint  Patient presents with   Colonoscopy    History of Present Illness   Lynn Wilson is a 42 y.o. female presenting today to reschedule her colonoscopy.   She has h/o LUQ pain, abnormal LFTs, GERD, constipation, IDA (has received IV iron infusions in the past, last time about 2 years ago). She was seen back in 06/2023. She completed labs and CT as outlined below. We offered EGD/colonoscopy (for LUQ pain, GERD, rectal bleeding) but she needed to postpone due to insurance issues.     Labs in September with normal LFTs, hemoglobin 11, MCV 73, TIBC 486, iron 63, iron sat 13%, ferritin 12, A1c 6.4, TSH 3.610. History of intermittently elevated AST/ALT less than 2 times normal.  In 09/2022, ALT 34, AP 157.   CT A/P with contrast 06/2023: IMPRESSION: -No acute findings within the abdomen or pelvis. -Colonic diverticulosis, without radiographic evidence of diverticulitis. -Increased size of uterine fibroids, largest measuring 5.5 cm. -Stable small umbilical hernia, which contains only fat.   RUQ U/S 11/2018: for elevated LFTs showed fatty liver.   EGD and colonoscopy  09/08/2018 - Normal duodenal bulb and second portion of the duodenum. - Erythematous mucosa in the antrum. - Normal gastric fundus, gastric body and incisura. Biopsied. - Esophagogastric landmarks identified. - Normal gastroesophageal junction and esophagus.   - The examined portion of the ileum was normal. - Severe diverticulosis in the sigmoid colon and in the descending colon. There was no evidence of diverticular Bleeding. - External and internal hemorrhoids. - Normal mucosa in the entire examined colon. - No specimens collected.    DIAGNOSIS:  A. STOMACH; COLD BIOPSY:  - OXYNTIC MUCOSA WITH MILD AND FOCAL  MODERATE CHRONIC GASTRITIS AND  CHANGES CONSISTENT WITH PROTON PUMP INHIBITOR EFFECT.  - IHC FOR HELICOBACTER IS NEGATIVE.  - NEGATIVE FOR DYSPLASIA AND MALIGNANCY.    Today: IUD placed recently, bleeding longer than usual currently. More constipation, typically has more constipation with her cycle. Taking Fiber gummy two per day. Taking miralax once weekly. BM most day. No straining. Occasional brbpr. Dicyclomine for abdominal cramping as needed. Sometimes 1/2 tablet, does not take regularly due to sedating effects. Some breakthrough heartburn if forgets to take pantoprazole. Medicine works if remembers to take it. No dysphagia. No LUQ pain lately, seems to have gotten better with management of constipation.  She states she wants to pursue colonoscopy but not EGD right now. States she didn't have reflux until after her first EGD years ago.   Medications   Current Outpatient Medications  Medication Sig Dispense Refill   amLODipine (NORVASC) 2.5 MG tablet Take 1 tablet (2.5 mg total) by mouth daily. 90 tablet 1   baclofen (LIORESAL) 10 MG tablet Take 1 tablet (10 mg total) by mouth 3 (three) times daily. For headaches 30 each 0   dicyclomine (BENTYL) 20 MG tablet Take 1 tablet (20 mg total) by mouth 3 (three) times daily as needed for spasms (abd cramping). 60 tablet 1   hydrochlorothiazide (HYDRODIURIL) 25 MG tablet Take 1 tablet (25 mg total) by mouth daily. 90 tablet 0   Iron, Ferrous Sulfate, 325 (65 Fe) MG TABS Take 325 mg by mouth daily. 180 tablet 0   levothyroxine (SYNTHROID) 100 MCG tablet TAKE 1 TABLET BY MOUTH IN THE MORNING 90  tablet 0   pantoprazole (PROTONIX) 40 MG tablet Take 1 tablet (40 mg total) by mouth 2 (two) times daily. 60 tablet 0   No current facility-administered medications for this visit.    Allergies   Allergies as of 11/08/2023 - Review Complete 11/08/2023  Allergen Reaction Noted   Celecoxib  03/08/2012   Sesame seed (diagnostic) Itching 01/25/2018    Lisinopril Rash 03/08/2012     Past Medical History   Past Medical History:  Diagnosis Date   GERD (gastroesophageal reflux disease)    Hypertension    Hypothyroid    Hypothyroid 01/25/2018   Iron deficiency anemia due to chronic blood loss 02/16/2018    Past Surgical History   Past Surgical History:  Procedure Laterality Date   COLONOSCOPY WITH PROPOFOL N/A 09/08/2018   Procedure: COLONOSCOPY WITH PROPOFOL;  Surgeon: Toney Reil, MD;  Location[facility] ENDOSCOPY;  Service: Gastroenterology;  Laterality: N/A;   ESOPHAGOGASTRODUODENOSCOPY (EGD) WITH PROPOFOL N/A 09/08/2018   Procedure: ESOPHAGOGASTRODUODENOSCOPY (EGD) WITH PROPOFOL;  Surgeon: Toney Reil, MD;  Location: [facility] ENDOSCOPY;  Service: Gastroenterology;  Laterality: N/A;    Past Family History   Family History  Problem Relation Age of Onset   Hypertension Mother    Hypertension Father    Stroke Father     Past Social History   Social History   Socioeconomic History   Marital status: Married    Spouse name: [name]   Number of children: 0   Years of education: Not on file   Highest education level: Some college, no degree  Occupational History   Occupation: Pharmacologist: [employer]  Tobacco Use   Smoking status: Every Day    Current packs/day: 0.75    Average packs/day: 0.7 packs/day for 23.8 years (17.8 ttl pk-yrs)    Types: Cigarettes    Start date: 01/26/2000    Passive exposure: Never   Smokeless tobacco: Never  Vaping Use   Vaping status: Never Used  Substance and Sexual Activity   Alcohol use: Yes    Alcohol/week: 2.0 standard drinks of alcohol    Types: 2 Cans of beer per week   Drug use: No   Sexual activity: Yes    Birth control/protection: Other-see comments    Comment: Homosexual  Other Topics Concern   Not on file  Social History Narrative   She moved to [place] from [place], [place] in the Summer of 2018; She lives with her wife She is works at [employer], retail,  [employer]. wife had BKA of right leg in 2020   Social Determinants of Health   Financial Resource Strain: Low Risk  (10/04/2023)   Overall Financial Resource Strain (CARDIA)    Difficulty of Paying Living Expenses: Not hard at all  Food Insecurity: No Food Insecurity (10/04/2023)   Hunger Vital Sign    Worried About Running Out of Food in the Last Year: Never true    Ran Out of Food in the Last Year: Never true  Transportation Needs: No Transportation Needs (10/04/2023)   PRAPARE - Administrator, Civil Service (Medical): No    Lack of Transportation (Non-Medical): No  Physical Activity: Inactive (10/04/2023)   Exercise Vital Sign    Days of Exercise per Week: 0 days    Minutes of Exercise per Session: 0 min  Stress: Stress Concern Present (05/24/2023)   [employer] of Occupational Health - Occupational Stress Questionnaire    Feeling of Stress : To some extent  Social Connections: Moderately Integrated (10/04/2023)   Social Connection and Isolation Panel [NHANES]    Frequency of Communication with Friends and Family: Twice a week    Frequency of Social Gatherings with Friends and Family: Twice a week    Attends Religious Services: 1 to 4 times per year    Active Member of [employer] or Organizations: Patient declined    Attends Banker Meetings: Never    Marital Status: Married  Catering manager Violence: Not At Risk (10/04/2023)   Humiliation, Afraid, Rape, and Kick questionnaire    Fear of Current or Ex-Partner: No    Emotionally Abused: No    Physically Abused: No    Sexually Abused: No    Review of Systems   General: Negative for anorexia, weight loss, fever, chills, fatigue, weakness. ENT: Negative for hoarseness, difficulty swallowing , nasal congestion. CV: Negative for chest pain, angina, palpitations, dyspnea on exertion, peripheral edema.  Respiratory: Negative for dyspnea at rest, dyspnea on exertion, cough, sputum, wheezing.  GI: See  history of present illness. GU:  Negative for dysuria, hematuria, urinary incontinence, urinary frequency, nocturnal urination.  Endo: Negative for unusual weight change.     Physical Exam   BP (!) 142/86 (BP Location: Right Arm, Patient Position: Sitting, Cuff Size: Large)   Pulse 67   Temp 98.7 F (37.1 C) (Oral)   Ht 5\' 5"  (1.651 m)   Wt 243 lb (110.2 kg)   LMP  (LMP Unknown)   SpO2 98%   BMI 40.44 kg/m    General: Well-nourished, well-developed in no acute distress.  Eyes: No icterus. Mouth: Oropharyngeal mucosa moist and pink   Lungs: Clear to auscultation bilaterally.  Heart: Regular rate and rhythm, no murmurs rubs or gallops.  Abdomen: Bowel sounds are normal, nondistended, no hepatosplenomegaly or masses, no abdominal bruits or hernia, no rebound or guarding. Mild epigastric tenderness Rectal: not performed  Extremities: No lower extremity edema. No clubbing or deformities. Neuro: Alert and oriented x 4   Skin: Warm and dry, no jaundice.   Psych: Alert and cooperative, normal mood and affect.  Labs   See hpi  Imaging Studies   No results found.  Assessment/Plan:   IDA/rectal bleeding: likely mostly driven by heavy menses. No recent IV iron infusions. Due to intermittent rectal bleeding, would be reasonable to update colonoscopy. ASA 2.  I have discussed the risks, alternatives, benefits with regards to but not limited to the risk of reaction to medication, bleeding, infection, perforation and the patient is agreeable to proceed. Written consent to be obtained.  Constipation: -miralax 17g daily prn  GERD: -continue pantoprazole 40mg  once to twice daily.   -antireflux measures   Elevated LFTS; -intermittently elevated in past, most recently with normal labs, liver unremarkable on CT but on prior u/s in 2019, fatty appearance.  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 -continue to monitor LFTs, CBC (FIB 4). If bump in LFTs again, consider work up.     Leanna Battles. Melvyn Neth, MHS, PA-C [facility]

## 2023-11-09 ENCOUNTER — Encounter: Payer: Self-pay | Admitting: Obstetrics & Gynecology

## 2023-11-09 MED ORDER — MEGESTROL ACETATE 40 MG PO TABS: ORAL_TABLET | ORAL | 3 refills | Status: DC

## 2023-11-09 NOTE — Progress Notes (Unsigned)
Name: Lynn Wilson   MRN: [id number]    DOB: 10-Jan-1981   Date:11/09/2023       Progress Note  Subjective  Chief Complaint  No chief complaint on file.   HPI  Patient presents for annual CPE.  Diet: *** Exercise: ***  Last Eye Exam: *** Last Dental Exam: ***  Flowsheet Row Office Visit from 10/04/2023 in [facility]  AUDIT-C Score 4      Depression: Phq 9 is  {Desc; negative/positive:13464}    10/04/2023   11:40 AM 08/23/2023   12:39 PM 05/24/2023   11:17 AM 09/29/2022   11:01 AM 07/06/2022    9:51 AM  Depression screen PHQ 2/9  Decreased Interest 0 1 0 0 0  Down, Depressed, Hopeless 0 0 0 0 0  PHQ - 2 Score 0 1 0 0 0  Altered sleeping 0 0  0 0  Tired, decreased energy 2 1  0 0  Change in appetite 2 1  0 0  Feeling bad or failure about yourself  0 0  0 0  Trouble concentrating 0 0  0 0  Moving slowly or fidgety/restless 0 0  0 0  Suicidal thoughts 0 0  0 0  PHQ-9 Score 4 3  0 0   Hypertension: BP Readings from Last 3 Encounters:  11/08/23 (!) 142/86  10/04/23 129/85  08/23/23 130/76   Obesity: Wt Readings from Last 3 Encounters:  11/08/23 243 lb (110.2 kg)  10/04/23 242 lb (109.8 kg)  08/23/23 241 lb (109.3 kg)   BMI Readings from Last 3 Encounters:  11/08/23 40.44 kg/m  10/04/23 40.27 kg/m  08/23/23 40.10 kg/m     Vaccines:   HPV: Aged out Tdap: 07/14/17 Shingrix: n/a Pneumonia: n/a Flu: complete COVID-19:refused   Hep C Screening: Complete STD testing and prevention (HIV/chl/gon/syphilis): HIV: complete Intimate partner violence: {Desc; negative/positive:13464} screen  Sexual History : Menstrual History/LMP/Abnormal Bleeding:  Discussed importance of follow up if any post-menopausal bleeding: {Response; yes/no/na:63}  Incontinence Symptoms: {Desc; negative/positive:13464} for symptoms   Breast cancer:  - Last Mammogram: Due/ordered - BRCA gene screening:   Osteoporosis Prevention :  Discussed high calcium and vitamin D supplementation, weight bearing exercises Bone density :not applicable   Cervical cancer screening: 02/26/22  Skin cancer: Discussed monitoring for atypical lesions  Colorectal cancer: n/a   Lung cancer:  Low Dose CT Chest recommended if Age 75-80 years, 20 pack-year currently smoking OR have quit w/in 15years. Patient does qualify for screen   ECG: 10/20/22  Advanced Care Planning: A voluntary discussion about advance care planning including the explanation and discussion of advance directives.  Discussed health care proxy and Living will, and the patient was able to identify a health care proxy as ***.  Patient {DOES_DOES [id number]:[id number]} have a living will and power of attorney of health care   Lipids: Lab Results  Component Value Date   CHOL 205 (H) 08/23/2023   CHOL 191 01/20/2022   CHOL 168 07/12/2019   Lab Results  Component Value Date   HDL 52 08/23/2023   HDL 54 01/20/2022   HDL 48 (L) 07/12/2019   Lab Results  Component Value Date   LDLCALC 115 (H) 08/23/2023   LDLCALC 113 (H) 01/20/2022   LDLCALC 104 (H) 07/12/2019   Lab Results  Component Value Date   TRIG 218 (H) 08/23/2023   TRIG 127 01/20/2022   TRIG 69 07/12/2019   Lab Results  Component Value Date  CHOLHDL 3.9 08/23/2023   CHOLHDL 3.5 01/20/2022   CHOLHDL 3.5 07/12/2019   No results found for: "LDLDIRECT"  Glucose: Glucose  Date Value Ref Range Status  08/23/2023 101 (H) 70 - 99 mg/dL Final   Glucose, Bld  Date Value Ref Range Status  05/24/2023 97 70 - 99 mg/dL Final    Comment:    Glucose reference range applies only to samples taken after fasting for at least 8 hours.  09/29/2022 127 (H) 65 - 99 mg/dL Final    Comment:    .            Fasting reference interval . For someone without known diabetes, a glucose value >125 mg/dL indicates that they may have diabetes and this should be confirmed with a follow-up test. .   01/20/2022 113 (H) 65 - 99 mg/dL  Final    Comment:    .            Fasting reference interval . For someone without known diabetes, a glucose value between 100 and 125 mg/dL is consistent with prediabetes and should be confirmed with a follow-up test. .     Patient Active Problem List   Diagnosis Date Noted   Rectal bleeding 11/08/2023   Constipation 06/28/2023   Elevated LFTs 06/28/2023   Pre-diabetes 09/29/2022   Smokers' cough (HCC) 09/29/2022   Symptomatic anemia 01/30/2022   Chronic left shoulder pain 02/22/2019   Chronic gastritis without bleeding 02/22/2019   LUQ pain    Menorrhagia with regular cycle 05/10/2018   Internal bleeding hemorrhoids 05/10/2018   Migraine without aura and responsive to treatment 02/16/2018   Iron deficiency anemia due to chronic blood loss 02/16/2018   Hyperglycemia 01/29/2018   Metabolic syndrome 01/29/2018   Essential hypertension 01/25/2018   GERD (gastroesophageal reflux disease) 01/25/2018   Hypothyroidism, adult 01/25/2018   Morbid obesity (HCC) 01/25/2018    Past Surgical History:  Procedure Laterality Date   COLONOSCOPY WITH PROPOFOL N/A 09/08/2018   Procedure: COLONOSCOPY WITH PROPOFOL;  Surgeon: Toney Reil, MD;  [facility] ENDOSCOPY;  Service: Gastroenterology;  Laterality: N/A;   ESOPHAGOGASTRODUODENOSCOPY (EGD) WITH PROPOFOL N/A 09/08/2018   Procedure: ESOPHAGOGASTRODUODENOSCOPY (EGD) WITH PROPOFOL;  Surgeon: Toney Reil, MD;  Location: [facility] ENDOSCOPY;  Service: Gastroenterology;  Laterality: N/A;    Family History  Problem Relation Age of Onset   Hypertension Mother    Hypertension Father    Stroke Father     Social History   Socioeconomic History   Marital status: Married    Spouse name: [name]   Number of children: 0   Years of education: Not on file   Highest education level: Some college, no degree  Occupational History   Occupation: Pharmacologist: [id number]  Tobacco Use   Smoking status: Every Day     Current packs/day: 0.75    Average packs/day: 0.8 packs/day for 23.8 years (17.8 ttl pk-yrs)    Types: Cigarettes    Start date: 01/26/2000    Passive exposure: Never   Smokeless tobacco: Never  Vaping Use   Vaping status: Never Used  Substance and Sexual Activity   Alcohol use: Yes    Alcohol/week: 2.0 standard drinks of alcohol    Types: 2 Cans of beer per week   Drug use: No   Sexual activity: Yes    Birth control/protection: Other-see comments    Comment: Homosexual  Other Topics Concern   Not on file  Social History Narrative  She moved to [place] from [place], [place] in the Summer of 2018; She lives with her wife She is works at [employer], retail, [employer]. wife had BKA of right leg in 2020   Social Determinants of Health   Financial Resource Strain: Low Risk  (10/04/2023)   Overall Financial Resource Strain (CARDIA)    Difficulty of Paying Living Expenses: Not hard at all  Food Insecurity: No Food Insecurity (10/04/2023)   Hunger Vital Sign    Worried About Running Out of Food in the Last Year: Never true    Ran Out of Food in the Last Year: Never true  Transportation Needs: No Transportation Needs (10/04/2023)   PRAPARE - Administrator, Civil Service (Medical): No    Lack of Transportation (Non-Medical): No  Physical Activity: Inactive (10/04/2023)   Exercise Vital Sign    Days of Exercise per Week: 0 days    Minutes of Exercise per Session: 0 min  Stress: Stress Concern Present (05/24/2023)   [employer] of Occupational Health - Occupational Stress Questionnaire    Feeling of Stress : To some extent  Social Connections: Moderately Integrated (10/04/2023)   Social Connection and Isolation Panel [NHANES]    Frequency of Communication with Friends and Family: Twice a week    Frequency of Social Gatherings with Friends and Family: Twice a week    Attends Religious Services: 1 to 4 times per year    Active Member of [employer] or Organizations: Patient  declined    Attends Banker Meetings: Never    Marital Status: Married  Catering manager Violence: Not At Risk (10/04/2023)   Humiliation, Afraid, Rape, and Kick questionnaire    Fear of Current or Ex-Partner: No    Emotionally Abused: No    Physically Abused: No    Sexually Abused: No     Current Outpatient Medications:    amLODipine (NORVASC) 2.5 MG tablet, Take 1 tablet (2.5 mg total) by mouth daily., Disp: 90 tablet, Rfl: 1   baclofen (LIORESAL) 10 MG tablet, Take 1 tablet (10 mg total) by mouth 3 (three) times daily. For headaches, Disp: 30 each, Rfl: 0   dicyclomine (BENTYL) 20 MG tablet, Take 1 tablet (20 mg total) by mouth 3 (three) times daily as needed for spasms (abd cramping)., Disp: 60 tablet, Rfl: 1   hydrochlorothiazide (HYDRODIURIL) 25 MG tablet, Take 1 tablet (25 mg total) by mouth daily., Disp: 90 tablet, Rfl: 0   Iron, Ferrous Sulfate, 325 (65 Fe) MG TABS, Take 325 mg by mouth daily., Disp: 180 tablet, Rfl: 0   levothyroxine (SYNTHROID) 100 MCG tablet, TAKE 1 TABLET BY MOUTH IN THE MORNING, Disp: 90 tablet, Rfl: 0   megestrol (MEGACE) 40 MG tablet, 3 tablets a day for 5 days, 2 tablets a day for 5 days then 1 tablet daily, Disp: 45 tablet, Rfl: 3   pantoprazole (PROTONIX) 40 MG tablet, Take 1 tablet (40 mg total) by mouth 2 (two) times daily., Disp: 60 tablet, Rfl: 0  Allergies  Allergen Reactions   Celecoxib     "shakes/tremors" Other reaction(s): other   Sesame Seed (Diagnostic) Itching   Lisinopril Rash     ROS  ***  Objective  There were no vitals filed for this visit.  There is no height or weight on file to calculate BMI.  Physical Exam ***  Recent Results (from the past 2160 hour(s))  Lipid panel     Status: Abnormal   Collection Time: 08/23/23  1:54 PM  Result Value Ref Range   Cholesterol, Total 205 (H) 100 - 199 mg/dL   Triglycerides 604 (H) 0 - 149 mg/dL   HDL 52 >54 mg/dL   VLDL Cholesterol Cal 38 5 - 40 mg/dL   LDL Chol  Calc (NIH) 115 (H) 0 - 99 mg/dL   Chol/HDL Ratio 3.9 0.0 - 4.4 ratio    Comment:                                   T. Chol/HDL Ratio                                             Men  Women                               1/2 Avg.Risk  3.4    3.3                                   Avg.Risk  5.0    4.4                                2X Avg.Risk  9.6    7.1                                3X Avg.Risk 23.4   11.0   CBC with Differential/Platelet     Status: Abnormal   Collection Time: 08/23/23  1:54 PM  Result Value Ref Range   WBC 8.1 3.4 - 10.8 x10E3/uL   RBC 5.01 3.77 - 5.28 x10E6/uL   Hemoglobin 11.0 (L) 11.1 - 15.9 g/dL   Hematocrit 09.8 11.9 - 46.6 %   MCV 73 (L) 79 - 97 fL   MCH 22.0 (L) 26.6 - 33.0 pg   MCHC 30.1 (L) 31.5 - 35.7 g/dL   RDW 14.7 (H) 82.9 - 56.2 %   Platelets 421 150 - 450 x10E3/uL   Neutrophils 52 Not Estab. %   Lymphs 38 Not Estab. %   Monocytes 9 Not Estab. %   Eos 1 Not Estab. %   Basos 0 Not Estab. %   Neutrophils Absolute 4.1 1.4 - 7.0 x10E3/uL   Lymphocytes Absolute 3.1 0.7 - 3.1 x10E3/uL   Monocytes Absolute 0.8 0.1 - 0.9 x10E3/uL   EOS (ABSOLUTE) 0.1 0.0 - 0.4 x10E3/uL   Basophils Absolute 0.0 0.0 - 0.2 x10E3/uL   Immature Granulocytes 0 Not Estab. %   Immature Grans (Abs) 0.0 0.0 - 0.1 x10E3/uL  Iron, TIBC and Ferritin Panel     Status: Abnormal   Collection Time: 08/23/23  1:54 PM  Result Value Ref Range   Total Iron Binding Capacity 486 (H) 250 - 450 ug/dL   UIBC 130 865 - 784 ug/dL   Iron 63 27 - 696 ug/dL   Iron Saturation 13 (L) 15 - 55 %   Ferritin 12 (L) 15 - 150 ng/mL  Hemoglobin A1c     Status: Abnormal   Collection Time: 08/23/23  1:54 PM  Result Value Ref Range   Hgb  A1c MFr Bld 6.4 (H) 4.8 - 5.6 %    Comment:          Prediabetes: 5.7 - 6.4          Diabetes: >6.4          Glycemic control for adults with diabetes: <7.0    Est. average glucose Bld gHb Est-mCnc 137 mg/dL  TSH     Status: None   Collection Time: 08/23/23  1:54 PM   Result Value Ref Range   TSH 3.610 0.450 - 4.500 uIU/mL  Comp. Metabolic Panel (12)     Status: Abnormal   Collection Time: 08/23/23  1:54 PM  Result Value Ref Range   Glucose 101 (H) 70 - 99 mg/dL   BUN 12 6 - 24 mg/dL   Creatinine, Ser 1.61 0.57 - 1.00 mg/dL   eGFR 88 >09 UE/AVW/0.98   BUN/Creatinine Ratio 14 9 - 23   Sodium 137 134 - 144 mmol/L   Potassium 4.4 3.5 - 5.2 mmol/L   Chloride 100 96 - 106 mmol/L   Calcium 9.3 8.7 - 10.2 mg/dL   Total Protein 7.9 6.0 - 8.5 g/dL   Albumin 4.4 3.9 - 4.9 g/dL   Globulin, Total 3.5 1.5 - 4.5 g/dL   Bilirubin Total <1.1 0.0 - 1.2 mg/dL   Alkaline Phosphatase 105 44 - 121 IU/L   AST 27 0 - 40 IU/L     Fall Risk:    10/04/2023   11:39 AM 08/23/2023   12:39 PM 05/24/2023   11:17 AM 09/29/2022   11:01 AM 07/06/2022    9:51 AM  Fall Risk   Falls in the past year? 0 0 0 0 0  Number falls in past yr: 0 0 0  0  Injury with Fall? 0 0 0  0  Risk for fall due to :  No Fall Risks No Fall Risks No Fall Risks No Fall Risks  Follow up  Falls prevention discussed Falls prevention discussed Falls prevention discussed;Education provided;Falls evaluation completed Falls prevention discussed   ***  Functional Status Survey:   ***  Assessment & Plan  There are no diagnoses linked to this encounter.  -USPSTF grade A and B recommendations reviewed with patient; age-appropriate recommendations, preventive care, screening tests, etc discussed and encouraged; healthy living encouraged; see AVS for patient education given to patient -Discussed importance of 150 minutes of physical activity weekly, eat two servings of fish weekly, eat one serving of tree nuts ( cashews, pistachios, pecans, almonds.Marland Kitchen) every other day, eat 6 servings of fruit/vegetables daily and drink plenty of water and avoid sweet beverages.   -Reviewed Health Maintenance: {yes [id number]}

## 2023-11-15 ENCOUNTER — Encounter: Payer: Self-pay | Admitting: Internal Medicine

## 2023-11-15 ENCOUNTER — Encounter: Payer: Medicaid Other | Admitting: Family Medicine

## 2023-11-15 DIAGNOSIS — Z1231 Encounter for screening mammogram for malignant neoplasm of breast: Secondary | ICD-10-CM

## 2023-11-24 ENCOUNTER — Telehealth: Payer: Self-pay | Admitting: *Deleted

## 2023-11-24 ENCOUNTER — Encounter: Payer: Self-pay | Admitting: Internal Medicine

## 2023-11-24 DIAGNOSIS — D500 Iron deficiency anemia secondary to blood loss (chronic): Secondary | ICD-10-CM

## 2023-11-24 DIAGNOSIS — K625 Hemorrhage of anus and rectum: Secondary | ICD-10-CM

## 2023-11-24 DIAGNOSIS — R1012 Left upper quadrant pain: Secondary | ICD-10-CM

## 2023-11-24 DIAGNOSIS — K5900 Constipation, unspecified: Secondary | ICD-10-CM

## 2023-11-24 NOTE — Telephone Encounter (Signed)
Called pt and she has been scheduled for TCS with Dr. Marletta Lor, ASA 2 on 01/11/24. Already has trylite at home.

## 2023-12-01 ENCOUNTER — Telehealth: Payer: BC Managed Care – PPO | Admitting: Physician Assistant

## 2023-12-01 DIAGNOSIS — Z5321 Procedure and treatment not carried out due to patient leaving prior to being seen by health care provider: Secondary | ICD-10-CM

## 2023-12-01 NOTE — Progress Notes (Signed)
Patient experienced connectivity issues- unable to visualize patient (they were not able to get camera to work) and audio was severely impaired. Informed patient they can try to schedule an office apt, do a virtual encounter through [facility] or try to schedule a new virtual apt and use a different phone or computer that has better Wifi connection

## 2023-12-03 ENCOUNTER — Telehealth: Payer: BC Managed Care – PPO | Admitting: Physician Assistant

## 2023-12-03 DIAGNOSIS — Z5321 Procedure and treatment not carried out due to patient leaving prior to being seen by health care provider: Secondary | ICD-10-CM

## 2023-12-03 NOTE — Progress Notes (Deleted)
    Virtual Visit via Video Note  I connected with Lynn Wilson on 12/03/23 at 11:00 AM EST by a video enabled telemedicine application and verified that I am speaking with the correct person using two identifiers.   Today's Provider: Jacquelin Hawking, MHS, PA-C Introduced myself to the patient as a PA-C and provided education on APPs in clinical practice.   Location: Patient: at home  Provider: [facility], [place], [place]    I discussed the limitations of evaluation and management by telemedicine and the availability of in person appointments. The patient expressed understanding and agreed to proceed.  Chief Complaint  Patient presents with   Cough    x1 week, productive    History of Present Illness:    Observations/Objective:   Assessment and Plan:   Follow Up Instructions:    I discussed the assessment and treatment plan with the patient. The patient was provided an opportunity to ask questions and all were answered. The patient agreed with the plan and demonstrated an understanding of the instructions.   The patient was advised to call back or seek an in-person evaluation if the symptoms worsen or if the condition fails to improve as anticipated.  I provided *** minutes of non-face-to-face time during this encounter.   Hemi Chacko E Lynn Recendiz, PA-C

## 2023-12-13 NOTE — Progress Notes (Signed)
Unable to connect with patient virtually after multiple attempts of sending visit link, phone calls from office to troubleshoot

## 2023-12-21 ENCOUNTER — Encounter: Payer: Self-pay | Admitting: Obstetrics & Gynecology

## 2024-01-10 ENCOUNTER — Other Ambulatory Visit: Payer: BC Managed Care – PPO

## 2024-01-10 ENCOUNTER — Other Ambulatory Visit: Payer: Self-pay | Admitting: Obstetrics & Gynecology

## 2024-01-10 ENCOUNTER — Ambulatory Visit: Payer: BC Managed Care – PPO | Admitting: Obstetrics & Gynecology

## 2024-01-10 ENCOUNTER — Encounter: Payer: Self-pay | Admitting: Obstetrics & Gynecology

## 2024-01-10 VITALS — BP 137/84 | HR 91 | Ht 65.0 in | Wt 237.0 lb

## 2024-01-10 DIAGNOSIS — N946 Dysmenorrhea, unspecified: Secondary | ICD-10-CM | POA: Diagnosis not present

## 2024-01-10 DIAGNOSIS — D219 Benign neoplasm of connective and other soft tissue, unspecified: Secondary | ICD-10-CM

## 2024-01-10 DIAGNOSIS — D649 Anemia, unspecified: Secondary | ICD-10-CM

## 2024-01-10 DIAGNOSIS — Z30432 Encounter for removal of intrauterine contraceptive device: Secondary | ICD-10-CM

## 2024-01-10 DIAGNOSIS — N938 Other specified abnormal uterine and vaginal bleeding: Secondary | ICD-10-CM | POA: Diagnosis not present

## 2024-01-10 MED ORDER — KETOROLAC TROMETHAMINE 10 MG PO TABS: 10.0000 mg | ORAL_TABLET | Freq: Three times a day (TID) | ORAL | 0 refills | Status: DC | PRN

## 2024-01-10 MED ORDER — MEGESTROL ACETATE 40 MG PO TABS: ORAL_TABLET | ORAL | 3 refills | Status: DC

## 2024-01-10 NOTE — Progress Notes (Signed)
LIMITED PELVIC US WITH ULTRASOUND GUIDANCE FOR IUD REMOVAL : Enlarged heterogeneous uterus with multiple fibroids,uterine vol.427 ml,IUD appears to be centrally located within the uterus,unsuccessful removal IUD with ultrasound guidance,performed by Dr Despina Hidden

## 2024-01-10 NOTE — Progress Notes (Signed)
Chief Complaint  Patient presents with   Vaginal Discharge      42 y.o. G0P0000 No LMP recorded. (Menstrual status: IUD). The current method of family planning is Mirena IUD in place but there for bleeding.  Outpatient Encounter Medications as of 01/10/2024  Medication Sig   amLODipine (NORVASC) 2.5 MG tablet Take 1 tablet (2.5 mg total) by mouth daily.   baclofen (LIORESAL) 10 MG tablet Take 1 tablet (10 mg total) by mouth 3 (three) times daily. For headaches   dicyclomine (BENTYL) 20 MG tablet Take 1 tablet (20 mg total) by mouth 3 (three) times daily as needed for spasms (abd cramping).   hydrochlorothiazide (HYDRODIURIL) 25 MG tablet Take 1 tablet (25 mg total) by mouth daily.   Iron, Ferrous Sulfate, 325 (65 Fe) MG TABS Take 325 mg by mouth daily.   ketorolac (TORADOL) 10 MG tablet Take 1 tablet (10 mg total) by mouth every 8 (eight) hours as needed.   levothyroxine (SYNTHROID) 100 MCG tablet TAKE 1 TABLET BY MOUTH IN THE MORNING   megestrol (MEGACE) 40 MG tablet 3 tablets a day for 5 days, 2 tablets a day for 5 days then 1 tablet daily   megestrol (MEGACE) 40 MG tablet 3 tablets a day for 5 days, 2 tablets a day for 5 days then 1 tablet daily   pantoprazole (PROTONIX) 40 MG tablet Take 1 tablet (40 mg total) by mouth 2 (two) times daily.   No facility-administered encounter medications on file as of 01/10/2024.    Subjective Pt has had bleeding and pain since IUD was placed She does not feel her bleeding is better than the megestrol alone and she is having pain she was not having previously  She does have 2 fibroids with total volume around 260 cc based on 01/2022 sonogram and current exam   Past Medical History:  Diagnosis Date   GERD (gastroesophageal reflux disease)    Hypertension    Hypothyroid    Hypothyroid 01/25/2018   Iron deficiency anemia due to chronic blood loss 02/16/2018    Past Surgical History:  Procedure Laterality Date   COLONOSCOPY WITH  PROPOFOL N/A 09/08/2018   Procedure: COLONOSCOPY WITH PROPOFOL;  Surgeon: Toney Reil, MD;  Location: [facility] ENDOSCOPY;  Service: Gastroenterology;  Laterality: N/A;   ESOPHAGOGASTRODUODENOSCOPY (EGD) WITH PROPOFOL N/A 09/08/2018   Procedure: ESOPHAGOGASTRODUODENOSCOPY (EGD) WITH PROPOFOL;  Surgeon: Toney Reil, MD;  Location: [facility] ENDOSCOPY;  Service: Gastroenterology;  Laterality: N/A;    OB History     Gravida  0   Para  0   Term  0   Preterm  0   AB  0   Living  0      SAB  0   IAB  0   Ectopic  0   Multiple  0   Live Births  0           Allergies  Allergen Reactions   Celecoxib     "shakes/tremors" Other reaction(s): other   Sesame Seed (Diagnostic) Itching   Lisinopril Rash    Social History   Socioeconomic History   Marital status: Married    Spouse name: [name]   Number of children: 0   Years of education: Not on file   Highest education level: Some college, no degree  Occupational History   Occupation: Pharmacologist: [id number]  Tobacco Use   Smoking status: Every Day    Current packs/day: 0.75  Average packs/day: 0.8 packs/day for 24.0 years (18.0 ttl pk-yrs)    Types: Cigarettes    Start date: 01/26/2000    Passive exposure: Never   Smokeless tobacco: Never  Vaping Use   Vaping status: Never Used  Substance and Sexual Activity   Alcohol use: Yes    Alcohol/week: 2.0 standard drinks of alcohol    Types: 2 Cans of beer per week   Drug use: No   Sexual activity: Yes    Birth control/protection: Other-see comments    Comment: Homosexual  Other Topics Concern   Not on file  Social History Narrative   She moved to [place] from [place], [place] in the Summer of 2018; She lives with her wife She is works at [employer]ch[employer], retail, [employer]. wife had BKA of right leg in 2020   Social Drivers of Health   Financial Resource Strain: Low Risk  (10/04/2023)   Overall Financial Resource Strain (CARDIA)    Difficulty of  Paying Living Expenses: Not hard at all  Food Insecurity: No Food Insecurity (10/04/2023)   Hunger Vital Sign    Worried About Running Out of Food in the Last Year: Never true    Ran Out of Food in the Last Year: Never true  Transportation Needs: No Transportation Needs (10/04/2023)   PRAPARE - Administrator, Civil Service (Medical): No    Lack of Transportation (Non-Medical): No  Physical Activity: Inactive (10/04/2023)   Exercise Vital Sign    Days of Exercise per Week: 0 days    Minutes of Exercise per Session: 0 min  Stress: Stress Concern Present (05/24/2023)   [employer] of Occupational Health - Occupational Stress Questionnaire    Feeling of Stress : To some extent  Social Connections: Moderately Integrated (10/04/2023)   Social Connection and Isolation Panel [NHANES]    Frequency of Communication with Friends and Family: Twice a week    Frequency of Social Gatherings with Friends and Family: Twice a week    Attends Religious Services: 1 to 4 times per year    Active Member of [employer] or Organizations: Patient declined    Attends Banker Meetings: Never    Marital Status: Married    Family History  Problem Relation Age of Onset   Hypertension Mother    Hypertension Father    Stroke Father     Medications:       Current Outpatient Medications:    amLODipine (NORVASC) 2.5 MG tablet, Take 1 tablet (2.5 mg total) by mouth daily., Disp: 90 tablet, Rfl: 1   baclofen (LIORESAL) 10 MG tablet, Take 1 tablet (10 mg total) by mouth 3 (three) times daily. For headaches, Disp: 30 each, Rfl: 0   dicyclomine (BENTYL) 20 MG tablet, Take 1 tablet (20 mg total) by mouth 3 (three) times daily as needed for spasms (abd cramping)., Disp: 60 tablet, Rfl: 1   hydrochlorothiazide (HYDRODIURIL) 25 MG tablet, Take 1 tablet (25 mg total) by mouth daily., Disp: 90 tablet, Rfl: 0   Iron, Ferrous Sulfate, 325 (65 Fe) MG TABS, Take 325 mg by mouth daily., Disp: 180 tablet,  Rfl: 0   ketorolac (TORADOL) 10 MG tablet, Take 1 tablet (10 mg total) by mouth every 8 (eight) hours as needed., Disp: 15 tablet, Rfl: 0   levothyroxine (SYNTHROID) 100 MCG tablet, TAKE 1 TABLET BY MOUTH IN THE MORNING, Disp: 90 tablet, Rfl: 0   megestrol (MEGACE) 40 MG tablet, 3 tablets a day for 5 days, 2 tablets  a day for 5 days then 1 tablet daily, Disp: 45 tablet, Rfl: 3   megestrol (MEGACE) 40 MG tablet, 3 tablets a day for 5 days, 2 tablets a day for 5 days then 1 tablet daily, Disp: 45 tablet, Rfl: 3   pantoprazole (PROTONIX) 40 MG tablet, Take 1 tablet (40 mg total) by mouth 2 (two) times daily., Disp: 60 tablet, Rfl: 0  Objective Blood pressure 137/84, pulse 91, height 5\' 5"  (1.651 m), weight 237 lb (107.5 kg).  No strings visible in vagina Tried non sonogram guided removal without success Then tried sonogram guided for about 10 minutes with cervical dilation also without success   Pertinent ROS No burning with urination, frequency or urgency No nausea, vomiting or diarrhea Nor fever chills or other constitutional symptoms   Labs or studies Sonogram done for guidance for IUD removal    Impression + Management Plan: Diagnoses this Encounter::   ICD-10-CM   1. DUB (dysfunctional uterine bleeding)  N93.8     2. Dysmenorrhea  N94.6     3. Symptomatic anemia  D64.9     4. Fibroids, uterus 266 cc, in nulliparous woman  D21.9         Medications prescribed during  this encounter: Meds ordered this encounter  Medications   megestrol (MEGACE) 40 MG tablet    Sig: 3 tablets a day for 5 days, 2 tablets a day for 5 days then 1 tablet daily    Dispense:  45 tablet    Refill:  3   ketorolac (TORADOL) 10 MG tablet    Sig: Take 1 tablet (10 mg total) by mouth every 8 (eight) hours as needed.    Dispense:  15 tablet    Refill:  0    Labs or Scans Ordered during this encounter: No orders of the defined types were placed in this encounter.     Follow up Return if  symptoms worsen or fail to improve.

## 2024-01-11 ENCOUNTER — Ambulatory Visit (HOSPITAL_COMMUNITY)
Admission: RE | Admit: 2024-01-11 | Payer: BC Managed Care – PPO | Source: Home / Self Care | Admitting: Internal Medicine

## 2024-01-11 ENCOUNTER — Encounter (HOSPITAL_COMMUNITY): Payer: Self-pay | Admitting: Anesthesiology

## 2024-01-11 ENCOUNTER — Telehealth: Payer: Self-pay | Admitting: *Deleted

## 2024-01-11 ENCOUNTER — Encounter (HOSPITAL_COMMUNITY): Admission: RE | Payer: Self-pay | Source: Home / Self Care

## 2024-01-11 SURGERY — COLONOSCOPY WITH PROPOFOL
Anesthesia: Monitor Anesthesia Care

## 2024-01-11 NOTE — Telephone Encounter (Signed)
Received message from endo patient cancelled procedure for today due to transportation. Patient will need OV to reschedule if she calls back

## 2024-01-17 ENCOUNTER — Other Ambulatory Visit: Payer: Self-pay | Admitting: Family Medicine

## 2024-01-17 DIAGNOSIS — I10 Essential (primary) hypertension: Secondary | ICD-10-CM

## 2024-01-17 DIAGNOSIS — E039 Hypothyroidism, unspecified: Secondary | ICD-10-CM

## 2024-01-17 MED ORDER — HYDROCHLOROTHIAZIDE 25 MG PO TABS: 25.0000 mg | ORAL_TABLET | Freq: Every day | ORAL | 0 refills | Status: AC

## 2024-01-17 MED ORDER — LEVOTHYROXINE SODIUM 100 MCG PO TABS: 100.0000 ug | ORAL_TABLET | Freq: Every morning | ORAL | 0 refills | Status: AC

## 2024-02-14 ENCOUNTER — Encounter: Payer: Self-pay | Admitting: Internal Medicine

## 2024-02-21 ENCOUNTER — Ambulatory Visit: Payer: Medicaid Other | Admitting: Family Medicine

## 2024-02-29 ENCOUNTER — Telehealth: Admitting: Physician Assistant

## 2024-02-29 DIAGNOSIS — R079 Chest pain, unspecified: Secondary | ICD-10-CM

## 2024-03-01 NOTE — Progress Notes (Signed)
 Message sent to patient requesting further input regarding current symptoms. Awaiting patient response.

## 2024-03-01 NOTE — Progress Notes (Signed)
 Patient currently in ER. E-visit canceled.

## 2024-04-14 ENCOUNTER — Encounter: Payer: Self-pay | Admitting: Radiology

## 2024-06-19 ENCOUNTER — Encounter: Payer: Self-pay | Admitting: Obstetrics & Gynecology

## 2024-06-21 ENCOUNTER — Encounter: Payer: Self-pay | Admitting: Obstetrics & Gynecology

## 2024-06-22 ENCOUNTER — Ambulatory Visit: Admitting: Obstetrics & Gynecology

## 2024-06-22 ENCOUNTER — Encounter: Payer: Self-pay | Admitting: Obstetrics & Gynecology

## 2024-06-22 VITALS — BP 147/95 | Ht 65.0 in | Wt 232.0 lb

## 2024-06-22 DIAGNOSIS — D219 Benign neoplasm of connective and other soft tissue, unspecified: Secondary | ICD-10-CM | POA: Diagnosis not present

## 2024-06-22 DIAGNOSIS — N946 Dysmenorrhea, unspecified: Secondary | ICD-10-CM

## 2024-06-22 DIAGNOSIS — D649 Anemia, unspecified: Secondary | ICD-10-CM | POA: Diagnosis not present

## 2024-06-22 NOTE — Progress Notes (Signed)
 Follow up appointment  Response to megestrol   Chief Complaint  Patient presents with   Pre-op Exam    Blood pressure (!) 147/95, height 5' 5 (1.651 m), weight 232 lb (105.2 kg).  Pt continues to have bleeding nearly every day, maybe skips a day or two evn on the continuous megestrol  therapy Also continues to have back and pelvic pain due to the fibroids, increased pain when bleeds but essentially has daily pain form the fibroids  As a result with her continued issues on megestrol  patient wants to proceed with definitive surgical therapy  Scheduled for RA TLH + BS 08/09/24  MEDS ordered this encounter: No orders of the defined types were placed in this encounter.   Orders for this encounter: No orders of the defined types were placed in this encounter.   Impression + Management Plan   ICD-10-CM   1. 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 Dysmenorrhea  N94.6     3. Symptomatic anemia  D64.9       Follow Up: Return in about 8 weeks (around 08/18/2024) for Post Op, with Dr Jayne.     All questions were answered.  Past Medical History:  Diagnosis Date   GERD (gastroesophageal reflux disease)    Hypertension    Hypothyroid    Hypothyroid 01/25/2018   Iron  deficiency anemia due to chronic blood loss 02/16/2018    Past Surgical History:  Procedure Laterality Date   COLONOSCOPY WITH PROPOFOL  N/A 09/08/2018   Procedure: COLONOSCOPY WITH PROPOFOL ;  Surgeon: Unk Corinn Skiff, MD;  Location: [facility] ENDOSCOPY;  Service: Gastroenterology;  Laterality: N/A;   ESOPHAGOGASTRODUODENOSCOPY (EGD) WITH PROPOFOL  N/A 09/08/2018   Procedure: ESOPHAGOGASTRODUODENOSCOPY (EGD) WITH PROPOFOL ;  Surgeon: Unk Corinn Skiff, MD;  Location: [facility] ENDOSCOPY;  Service: Gastroenterology;  Laterality: N/A;    OB History     Gravida  0   Para  0   Term  0   Preterm  0   AB  0   Living  0      SAB  0   IAB  0   Ectopic  0   Multiple  0   Live Births  0            Allergies  Allergen Reactions   Celecoxib     shakes/tremors Other reaction(s): other   Sesame Seed (Diagnostic) Itching   Lisinopril Rash    Social History   Socioeconomic History   Marital status: Married    Spouse name: Engineer, building services   Number of children: 0   Years of education: Not on file   Highest education level: Some college, no degree  Occupational History   Occupation: Pharmacologist: [id number]  Tobacco Use   Smoking status: Every Day    Current packs/day: 0.75    Average packs/day: 0.8 packs/day for 24.4 years (18.3 ttl pk-yrs)    Types: Cigarettes    Start date: 01/26/2000    Passive exposure: Never   Smokeless tobacco: Never  Vaping Use   Vaping status: Never Used  Substance and Sexual Activity   Alcohol use: Yes    Alcohol/week: 2.0 standard drinks of alcohol    Types: 2 Cans of beer per week   Drug use: No   Sexual activity: Yes    Birth control/protection: Other-see comments    Comment: Homosexual  Other Topics Concern   Not on file  Social History Narrative   She moved to  [place] from [place], [place] in the Summer of 2018; She lives with her wife She is works at [employer]ch[employer], retail, [employer]. wife had BKA of right leg in 2020   Social Drivers of Health   Financial Resource Strain: Low Risk  (02/20/2024)   Overall Financial Resource Strain (CARDIA)    Difficulty of Paying Living Expenses: Not very hard  Food Insecurity: No Food Insecurity (02/20/2024)   Hunger Vital Sign    Worried About Running Out of Food in the Last Year: Never true    Ran Out of Food in the Last Year: Never true  Transportation Needs: No Transportation Needs (02/20/2024)   PRAPARE - Administrator, Civil Service (Medical): No    Lack of Transportation (Non-Medical): No  Physical Activity: Inactive (02/20/2024)   Exercise Vital Sign    Days of Exercise per Week: 0 days    Minutes of Exercise per Session: 0 min  Stress: No Stress Concern Present  (02/20/2024)   [employer] of Occupational Health - Occupational Stress Questionnaire    Feeling of Stress : Only a little  Social Connections: Moderately Isolated (02/20/2024)   Social Connection and Isolation Panel    Frequency of Communication with Friends and Family: Twice a week    Frequency of Social Gatherings with Friends and Family: Never    Attends Religious Services: 1 to 4 times per year    Active Member of [employer] or Organizations: No    Attends Banker Meetings: Never    Marital Status: Married    Family History  Problem Relation Age of Onset   Hypertension Mother    Hypertension Father    Stroke Father

## 2024-07-17 ENCOUNTER — Other Ambulatory Visit: Payer: Self-pay

## 2024-07-17 DIAGNOSIS — Z1231 Encounter for screening mammogram for malignant neoplasm of breast: Secondary | ICD-10-CM

## 2024-07-18 ENCOUNTER — Ambulatory Visit
Admission: RE | Admit: 2024-07-18 | Discharge: 2024-07-18 | Disposition: A | Discharge status: Home / Self Care | Source: Ambulatory Visit | Attending: Student | Admitting: Student

## 2024-07-18 DIAGNOSIS — Z1231 Encounter for screening mammogram for malignant neoplasm of breast: Secondary | ICD-10-CM

## 2024-07-27 ENCOUNTER — Encounter: Admitting: Obstetrics & Gynecology

## 2024-08-04 NOTE — Patient Instructions (Addendum)
 Lynn Wilson  08/04/2024     @PREFPERIOPPHARMACY @   Your procedure is scheduled on 08/09/2024.   [facility] to [facility] at 6:30 [facility].M.   Call this number if you have problems the morning of surgery:   [phone]  If you experience any cold or flu symptoms such as cough, fever, chills, shortness of breath, etc. between now and your scheduled surgery, please notify us  at the above number.   Remember:   Do not eat  after midnight.   You may drink clear liquids until 4:30 AM .  Clear liquids allowed are:                    Water, Carbonated beverages (diabetics please choose diet or no sugar options), Clear Tea (No creamer, milk, or cream, including half & half and powdered creamer), and Clear Sports drink (No red color; diabetics please choose diet or no sugar options)    Take these medicines the morning of surgery with A SIP OF WATER : Baclofen  Levothyroxine  and Protonix    Please drink your ERAS at 4:30 AM the morning of the procedure.    Do not wear jewelry, make-up or nail polish, including gel polish,  artificial nails, or any other type of covering on natural nails (fingers and  toes).  Do not wear lotions, powders, or perfumes, or deodorant.  Do not shave 48 hours prior to surgery.  Men may shave face and neck.  Do not bring valuables to the hospital.  [facility] is not responsible for any belongings or valuables.  Contacts, dentures or bridgework may not be worn into surgery.  Leave your suitcase in the car.  After surgery it may be brought to your room.  For patients admitted to the hospital, discharge time will be determined by your treatment team.  Patients discharged the day of surgery will not be allowed to drive home.   Name and phone number of your driver:   family  Special instructions:  N/A  Please read over the following fact sheets that you were given.  Care and Recovery After Surgery  Laparoscopically Assisted Vaginal Hysterectomy  A  laparoscopic assisted vaginal hysterectomy (LAVH) is a surgery to remove the uterus. The surgery is minimally invasive. This means that very small incisions are made, instead of one large incision. This surgery can be done with or without a robot. During this surgery, your fallopian tubes and ovaries may also be taken out. This surgery may be done if you have: Growths in the uterus, such as fibroids. This is the most common reason. Endometriosis. This is when the lining of the uterus is growing outside of the uterus. Pelvic organ prolapse. This is when the uterus has moved down and bulges into the vagina. Cancer of the uterus or cervix. Abnormal or heavy bleeding during your period. Long-term (chronic) pain in the pelvis. After this surgery, you'll no longer be able to have a baby, and you'll no longer have a period. Tell a health care provider about: Any allergies you have. All medicines you're taking. These include vitamins, herbs, eye drops, creams, and over-the-counter medicines. Any problems you or family members have had with anesthesia. Any bleeding problems you have. Any surgeries you've had. Any medical conditions you have. Whether you're pregnant or may be pregnant. What are the risks? Your health care provider will talk with you about risks. These may include: Bleeding or blood clots in the legs or lungs. Infection. Damage to nearby  structures or organs, including nerve, bladder, or bowel injury. Reaction to the medicines used. Early symptoms of menopause. You may have hot flashes, mood swings, dryness of the vagina, and low estrogen if both ovaries are removed. What happens before the surgery? When to stop eating and drinking Follow instructions from your provider about what you may eat and drink. These may include: 8 hours before your surgery Stop eating most foods. Do not eat meat, fried foods, or fatty foods. Eat only light foods, such as toast or crackers. All liquids  are okay except energy drinks and alcohol. 6 hours before your surgery Stop eating. Drink only clear liquids, such as water, clear fruit juice, black coffee, plain tea, and sports drinks. Do not drink energy drinks or alcohol. 2 hours before your surgery Stop drinking all liquids. You may be allowed to take medicines with small sips of water. If you don't follow your provider's instructions, your surgery may be delayed or canceled. Medicines Ask your provider about: Changing or stopping your regular medicines. These include any diabetes medicines or blood thinners you take. Taking medicines, such as aspirin and ibuprofen . These medicines can thin your blood. Do not take them unless you are told to. Vitamins, herbs, and supplements. You may be asked to take a medicine to empty your colon (bowel prep). Surgery safety For your safety, your provider may: Remove hair at the surgery site. Ask you to wash with a soap that kills germs. Give you antibiotics. General instructions If you were asked to do a bowel prep before the surgery, do it as told by your provider. Do not use any products that contain nicotine or tobacco for at least 4 weeks before the surgery. These products include cigarettes, chewing tobacco, and vaping devices, such as e-cigarettes. If you need help quitting, ask your provider. What happens during the surgery? You will be given: A sedative. This helps you relax. Anesthesia. This keeps you from feeling pain. It will make you fall asleep. Your surgeon will make several small cuts, or incisions, in your belly. Tools for surgery will be inserted into the small incisions. The surgeon will take out your uterus through the vagina. Your small incisions will be closed with stitches, skin glue, or tape strips. This surgery may vary among providers and hospitals. What happens after the surgery? Your blood pressure, heart rate, breathing rate, and blood oxygen level will be  monitored until you leave the hospital. You will be given pain medicine as needed. You will have a soft tube, or catheter, in place for a few hours. It will likely be removed the day after surgery. You will wear a pad because you will have bleeding and discharge from the vagina. Where to find more information [employer] of [employer]stetricians and Gynecologists: acog.org This information is not intended to replace advice given to you by your health care provider. Make sure you discuss any questions you have with your health care provider. Document Revised: 03/12/2023 Document Reviewed: 03/12/2023 Elsevier Patient Education  2024 Elsevier Inc.   General Anesthesia, Adult General anesthesia is the use of medicine to make you fall asleep (unconscious) for a medical procedure. General anesthesia must be used for certain procedures. It is often recommended for surgery or procedures that: Last a long time. Require you to be still or in an unusual position. Are major and can cause blood loss. Affect your breathing. The medicines used for general anesthesia are called general anesthetics. During general anesthesia, these medicines are given along  with medicines that: Prevent pain. Control your blood pressure. Relax your muscles. Prevent nausea and vomiting after the procedure. Tell a health care provider about: Any allergies you have. All medicines you are taking, including vitamins, herbs, eye drops, creams, and over-the-counter medicines. Your history of any: Medical conditions you have, including: High blood pressure. Bleeding problems. Diabetes. Heart or lung conditions, such as: Heart failure. Sleep apnea. Asthma. Chronic obstructive pulmonary disease (COPD). Current or recent illnesses, such as: Upper respiratory, chest, or ear infections. Cough or fever. Tobacco or drug use, including marijuana or alcohol use. Depression or anxiety. Surgeries and types of anesthetics you have  had. Problems you or family members have had with anesthetic medicines. Whether you are pregnant or may be pregnant. Whether you have any chipped or loose teeth, dentures, caps, bridgework, or issues with your mouth, swallowing, or choking. What are the risks? Your health care provider will talk with you about risks. These may include: Allergic reaction to the medicines. Lung and heart problems. Inhaling food or liquid from the stomach into the lungs (aspiration). Nerve injury. Injury to the lips, mouth, teeth, or gums. Stroke. Waking up during your procedure and being unable to move. This is rare. These problems are more likely to develop if you are having a major surgery or if you have an advanced or serious medical condition. You can prevent some of these complications by answering all of your health care provider's questions thoroughly and by following all instructions before your procedure. General anesthesia can cause side effects, including: Nausea or vomiting. A sore throat or hoarseness from the breathing tube. Wheezing or coughing. Shaking chills or feeling cold. Body aches. Sleepiness. Confusion, agitation (delirium), or anxiety. What happens before the procedure? When to stop eating and drinking Follow instructions from your health care provider about what you may eat and drink before your procedure. If you do not follow your health care provider's instructions, your procedure may be delayed or canceled. Medicines Ask your health care provider about: Changing or stopping your regular medicines. These include any diabetes medicines or blood thinners you take. Taking medicines such as aspirin and ibuprofen . These medicines can thin your blood. Do not take them unless your health care provider tells you to. Taking over-the-counter medicines, vitamins, herbs, and supplements. General instructions Do not use any products that contain nicotine or tobacco for at least 4 weeks  before the procedure. These products include cigarettes, chewing tobacco, and vaping devices, such as e-cigarettes. If you need help quitting, ask your health care provider. If you brush your teeth on the morning of the procedure, make sure to spit out all of the water and toothpaste. If told by your health care provider, bring your sleep apnea device with you to surgery (if applicable). If you will be going home right after the procedure, plan to have a responsible adult: Take you home from the hospital or clinic. You will not be allowed to drive. Care for you for the time you are told. What happens during the procedure?  An IV will be inserted into one of your veins. You will be given one or more of the following through a face mask or IV: A sedative. This helps you relax. Anesthesia. This will: Numb certain areas of your body. Make you fall asleep for surgery. After you are unconscious, a breathing tube may be inserted down your throat to help you breathe. This will be removed before you wake up. An anesthesia provider, such as an anesthesiologist,  will stay with you throughout your procedure. The anesthesia provider will: Keep you comfortable and safe by continuing to give you medicines and adjusting the amount of medicine that you get. Monitor your blood pressure, heart rate, and oxygen levels to make sure that the anesthetics do not cause any problems. The procedure may vary among health care providers and hospitals. What happens after the procedure? Your blood pressure, temperature, heart rate, breathing rate, and blood oxygen level will be monitored until you leave the hospital or clinic. You will wake up in a recovery area. You may wake up slowly. You may be given medicine to help you with pain, nausea, or any other side effects from the anesthesia. Summary General anesthesia is the use of medicine to make you fall asleep (unconscious) for a medical procedure. Follow your health  care provider's instructions about when to stop eating, drinking, or taking certain medicines before your procedure. Plan to have a responsible adult take you home from the hospital or clinic. This information is not intended to replace advice given to you by your health care provider. Make sure you discuss any questions you have with your health care provider. Document Revised: 02/26/2022 Document Reviewed: 02/26/2022 Elsevier Patient Education  2024 Elsevier Inc.  How to Use Chlorhexidine at Home in the Shower Chlorhexidine gluconate (CHG) is a germ-killing (antiseptic) wash that's used to clean the skin. It can get rid of the germs that normally live on the skin and can keep them away for about 24 hours. If you're having surgery, you may be told to shower with CHG at home the night before surgery. This can help lower your risk for infection. To use CHG wash in the shower, follow the steps below. Supplies needed: CHG body wash. Clean washcloth. Clean towel. How to use CHG in the shower Follow these steps unless you're told to use CHG in a different way: Start the shower. Use your normal soap and shampoo to wash your face and hair. Turn off the shower or move out of the shower stream. Pour CHG onto a clean washcloth. Do not use any type of brush or rough sponge. Start at your neck, washing your body down to your toes. Make sure you: Wash the part of your body where the surgery will be done for at least 1 minute. Do not scrub. Do not use CHG on your head or face unless your health care provider tells you to. If it gets into your ears or eyes, rinse them well with water. Do not wash your genitals with CHG. Wash your back and under your arms. Make sure to wash skin folds. Let the CHG sit on your skin for 1-2 minutes or as long as told. Rinse your entire body in the shower, including all body creases and folds. Turn off the shower. Dry off with a clean towel. Do not put anything on your  skin afterward, such as powder, lotion, or perfume. Put on clean clothes or pajamas. If it's the night before surgery, sleep in clean sheets. General tips Use CHG only as told, and follow the instructions on the label. Use the full amount of CHG as told. This is often one bottle. Do not smoke and stay away from flames after using CHG. Your skin may feel sticky after using CHG. This is normal. The sticky feeling will go away as the CHG dries. Do not use CHG: If you have a chlorhexidine allergy or have reacted to chlorhexidine in the past. On open  wounds or areas of skin that have broken skin, cuts, or scrapes. On babies younger than 9 months of age. Contact a health care provider if: You have questions about using CHG. Your skin gets irritated or itchy. You have a rash after using CHG. You swallow any CHG. Call your local poison control center [phone] in the [place] Your eyes itch badly, or they become very red or swollen. Your hearing changes. You have trouble seeing. If you can't reach your provider, go to an urgent care or emergency room. Do not drive yourself. Get help right away if: You have swelling or tingling in your mouth or throat. You make high-pitched whistling sounds when you breathe, most often when you breathe out (wheeze). You have trouble breathing. These symptoms may be an emergency. Call 911 right away. Do not wait to see if the symptoms will go away. Do not drive yourself to the hospital. This information is not intended to replace advice given to you by your health care provider. Make sure you discuss any questions you have with your health care provider. Document Revised: 06/15/2023 Document Reviewed: 06/11/2022 Elsevier Patient Education  2024 [employer].

## 2024-08-07 ENCOUNTER — Encounter (HOSPITAL_COMMUNITY): Payer: Self-pay

## 2024-08-07 ENCOUNTER — Encounter (HOSPITAL_COMMUNITY)
Admission: RE | Admit: 2024-08-07 | Discharge: 2024-08-07 | Disposition: A | Discharge status: Home / Self Care | Source: Ambulatory Visit | Attending: Obstetrics & Gynecology

## 2024-08-07 ENCOUNTER — Other Ambulatory Visit: Payer: Self-pay

## 2024-08-07 ENCOUNTER — Other Ambulatory Visit (HOSPITAL_COMMUNITY): Payer: Self-pay | Admitting: Obstetrics & Gynecology

## 2024-08-07 DIAGNOSIS — N946 Dysmenorrhea, unspecified: Secondary | ICD-10-CM | POA: Diagnosis present

## 2024-08-07 DIAGNOSIS — Z79890 Hormone replacement therapy (postmenopausal): Secondary | ICD-10-CM | POA: Diagnosis not present

## 2024-08-07 DIAGNOSIS — E66813 Obesity, class 3: Secondary | ICD-10-CM | POA: Diagnosis not present

## 2024-08-07 DIAGNOSIS — Z975 Presence of (intrauterine) contraceptive device: Secondary | ICD-10-CM | POA: Diagnosis not present

## 2024-08-07 DIAGNOSIS — Z01812 Encounter for preprocedural laboratory examination: Secondary | ICD-10-CM | POA: Insufficient documentation

## 2024-08-07 DIAGNOSIS — I10 Essential (primary) hypertension: Secondary | ICD-10-CM | POA: Diagnosis not present

## 2024-08-07 DIAGNOSIS — D509 Iron deficiency anemia, unspecified: Secondary | ICD-10-CM | POA: Diagnosis present

## 2024-08-07 DIAGNOSIS — F17210 Nicotine dependence, cigarettes, uncomplicated: Secondary | ICD-10-CM | POA: Diagnosis not present

## 2024-08-07 DIAGNOSIS — Z6838 Body mass index (BMI) 38.0-38.9, adult: Secondary | ICD-10-CM | POA: Diagnosis not present

## 2024-08-07 DIAGNOSIS — D259 Leiomyoma of uterus, unspecified: Secondary | ICD-10-CM | POA: Diagnosis present

## 2024-08-07 DIAGNOSIS — Z01818 Encounter for other preprocedural examination: Secondary | ICD-10-CM

## 2024-08-07 LAB — CBC
HCT: 36.1 % (ref 36.0–46.0)
Hemoglobin: 11.4 g/dL — ABNORMAL LOW (ref 12.0–15.0)
MCH: 22.7 pg — ABNORMAL LOW (ref 26.0–34.0)
MCHC: 31.6 g/dL (ref 30.0–36.0)
MCV: 71.8 fL — ABNORMAL LOW (ref 80.0–100.0)
Platelets: 428 K/uL — ABNORMAL HIGH (ref 150–400)
RBC: 5.03 MIL/uL (ref 3.87–5.11)
RDW: 18.3 % — ABNORMAL HIGH (ref 11.5–15.5)
WBC: 7.1 K/uL (ref 4.0–10.5)
nRBC: 0 % (ref 0.0–0.2)

## 2024-08-07 LAB — TYPE AND SCREEN
ABO/RH(D): O POS
Antibody Screen: NEGATIVE

## 2024-08-07 LAB — URINALYSIS, ROUTINE W REFLEX MICROSCOPIC
Bacteria, UA: NONE SEEN
Bilirubin Urine: NEGATIVE
Glucose, UA: NEGATIVE mg/dL
Ketones, ur: NEGATIVE mg/dL
Leukocytes,Ua: NEGATIVE
Nitrite: NEGATIVE
Protein, ur: NEGATIVE mg/dL
Specific Gravity, Urine: 1.013 (ref 1.005–1.030)
pH: 6 (ref 5.0–8.0)

## 2024-08-07 LAB — COMPREHENSIVE METABOLIC PANEL WITH GFR
ALT: 27 U/L (ref 0–44)
AST: 33 U/L (ref 15–41)
Albumin: 4 g/dL (ref 3.5–5.0)
Alkaline Phosphatase: 77 U/L (ref 38–126)
Anion gap: 10 (ref 5–15)
BUN: 15 mg/dL (ref 6–20)
CO2: 21 mmol/L — ABNORMAL LOW (ref 22–32)
Calcium: 9.3 mg/dL (ref 8.9–10.3)
Chloride: 105 mmol/L (ref 98–111)
Creatinine, Ser: 0.81 mg/dL (ref 0.44–1.00)
GFR, Estimated: >60 mL/min (ref >60)
Glucose, Bld: 96 mg/dL (ref 70–99)
Potassium: 3.4 mmol/L — ABNORMAL LOW (ref 3.5–5.1)
Sodium: 136 mmol/L (ref 135–145)
Total Bilirubin: 0.5 mg/dL (ref 0.0–1.2)
Total Protein: 8.3 g/dL — ABNORMAL HIGH (ref 6.5–8.1)

## 2024-08-07 LAB — RAPID HIV SCREEN (HIV 1/2 AB+AG)
HIV 1/2 Antibodies: NONREACTIVE
HIV-1 P24 Antigen - HIV24: NONREACTIVE

## 2024-08-07 LAB — PREGNANCY, URINE: Preg Test, Ur: NEGATIVE

## 2024-08-07 NOTE — Pre-Procedure Instructions (Signed)
 Dr Jayne raker and order for type and screen placed.

## 2024-08-09 ENCOUNTER — Ambulatory Visit (HOSPITAL_COMMUNITY): Admitting: Certified Registered"

## 2024-08-09 ENCOUNTER — Ambulatory Visit (HOSPITAL_COMMUNITY)
Admission: RE | Admit: 2024-08-09 | Discharge: 2024-08-09 | Disposition: A | Discharge status: Home / Self Care | Attending: Obstetrics & Gynecology | Admitting: Obstetrics & Gynecology

## 2024-08-09 ENCOUNTER — Other Ambulatory Visit: Payer: Self-pay

## 2024-08-09 ENCOUNTER — Encounter (HOSPITAL_COMMUNITY): Payer: Self-pay | Admitting: Obstetrics & Gynecology

## 2024-08-09 ENCOUNTER — Encounter (HOSPITAL_COMMUNITY)
Admission: RE | Disposition: A | Discharge status: Home / Self Care | Payer: Self-pay | Source: Home / Self Care | Attending: Obstetrics & Gynecology

## 2024-08-09 DIAGNOSIS — N946 Dysmenorrhea, unspecified: Secondary | ICD-10-CM | POA: Diagnosis not present

## 2024-08-09 DIAGNOSIS — D259 Leiomyoma of uterus, unspecified: Secondary | ICD-10-CM | POA: Insufficient documentation

## 2024-08-09 DIAGNOSIS — I10 Essential (primary) hypertension: Secondary | ICD-10-CM | POA: Insufficient documentation

## 2024-08-09 DIAGNOSIS — R102 Pelvic and perineal pain: Secondary | ICD-10-CM

## 2024-08-09 DIAGNOSIS — Z975 Presence of (intrauterine) contraceptive device: Secondary | ICD-10-CM | POA: Insufficient documentation

## 2024-08-09 DIAGNOSIS — E66813 Obesity, class 3: Secondary | ICD-10-CM | POA: Insufficient documentation

## 2024-08-09 DIAGNOSIS — Z6838 Body mass index (BMI) 38.0-38.9, adult: Secondary | ICD-10-CM | POA: Insufficient documentation

## 2024-08-09 DIAGNOSIS — N921 Excessive and frequent menstruation with irregular cycle: Secondary | ICD-10-CM

## 2024-08-09 DIAGNOSIS — Z79890 Hormone replacement therapy (postmenopausal): Secondary | ICD-10-CM | POA: Insufficient documentation

## 2024-08-09 DIAGNOSIS — F17210 Nicotine dependence, cigarettes, uncomplicated: Secondary | ICD-10-CM | POA: Insufficient documentation

## 2024-08-09 DIAGNOSIS — D509 Iron deficiency anemia, unspecified: Secondary | ICD-10-CM | POA: Insufficient documentation

## 2024-08-09 HISTORY — PX: HYSTERECTOMY, TOTAL, LAPAROSCOPIC, ROBOT-ASSISTED WITH SALPINGECTOMY: SHX7587

## 2024-08-09 SURGERY — HYSTERECTOMY, TOTAL, LAPAROSCOPIC, ROBOT-ASSISTED WITH SALPINGECTOMY
Anesthesia: General | Site: Abdomen | Laterality: Bilateral

## 2024-08-09 MED ORDER — LABETALOL HCL 5 MG/ML IV SOLN
INTRAVENOUS | Status: AC
  Filled 2024-08-09: qty 4

## 2024-08-09 MED ORDER — CEFAZOLIN SODIUM-DEXTROSE 2-4 GM/100ML-% IV SOLN
2.0000 g | INTRAVENOUS | Status: AC
  Administered 2024-08-09: 2 g via INTRAVENOUS
  Filled 2024-08-09: qty 100

## 2024-08-09 MED ORDER — ROCURONIUM BROMIDE 10 MG/ML (PF) SYRINGE
PREFILLED_SYRINGE | INTRAVENOUS | Status: AC
  Filled 2024-08-09: qty 10

## 2024-08-09 MED ORDER — ACETAMINOPHEN 10 MG/ML IV SOLN
INTRAVENOUS | Status: DC | PRN
  Administered 2024-08-09: 1000 mg via INTRAVENOUS

## 2024-08-09 MED ORDER — LIDOCAINE 2% (20 MG/ML) 5 ML SYRINGE
INTRAMUSCULAR | Status: AC
  Filled 2024-08-09: qty 5

## 2024-08-09 MED ORDER — SUGAMMADEX SODIUM 200 MG/2ML IV SOLN
INTRAVENOUS | Status: AC
  Filled 2024-08-09: qty 2

## 2024-08-09 MED ORDER — MIDAZOLAM HCL 5 MG/5ML IJ SOLN
INTRAMUSCULAR | Status: DC | PRN
  Administered 2024-08-09: 2 mg via INTRAVENOUS

## 2024-08-09 MED ORDER — CIPROFLOXACIN HCL 500 MG PO TABS: 500.0000 mg | ORAL_TABLET | Freq: Two times a day (BID) | ORAL | 0 refills | Status: DC

## 2024-08-09 MED ORDER — BUPIVACAINE HCL (PF) 0.25 % IJ SOLN
INTRAMUSCULAR | Status: AC
  Filled 2024-08-09: qty 60

## 2024-08-09 MED ORDER — BUPIVACAINE HCL 0.25 % IJ SOLN
INTRAMUSCULAR | Status: DC | PRN
  Administered 2024-08-09: 40 mL

## 2024-08-09 MED ORDER — ROCURONIUM BROMIDE 100 MG/10ML IV SOLN
INTRAVENOUS | Status: DC | PRN
  Administered 2024-08-09: 80 mg via INTRAVENOUS

## 2024-08-09 MED ORDER — ONDANSETRON HCL 4 MG/2ML IJ SOLN
INTRAMUSCULAR | Status: DC | PRN
  Administered 2024-08-09: 4 mg via INTRAVENOUS

## 2024-08-09 MED ORDER — MIDAZOLAM HCL 2 MG/2ML IJ SOLN
INTRAMUSCULAR | Status: AC
Start: 2024-08-09 — End: 2024-08-09
  Filled 2024-08-09: qty 2

## 2024-08-09 MED ORDER — ROCURONIUM BROMIDE 10 MG/ML (PF) SYRINGE
PREFILLED_SYRINGE | INTRAVENOUS | Status: AC
Start: 2024-08-09 — End: 2024-08-09
  Filled 2024-08-09: qty 10

## 2024-08-09 MED ORDER — DEXAMETHASONE SODIUM PHOSPHATE 10 MG/ML IJ SOLN
INTRAMUSCULAR | Status: AC
  Filled 2024-08-09: qty 1

## 2024-08-09 MED ORDER — PROPOFOL 10 MG/ML IV BOLUS
INTRAVENOUS | Status: AC
  Filled 2024-08-09: qty 20

## 2024-08-09 MED ORDER — CHLORHEXIDINE GLUCONATE 0.12 % MT SOLN: 15.0000 mL | Freq: Once | OROMUCOSAL | Status: DC

## 2024-08-09 MED ORDER — DEXAMETHASONE SODIUM PHOSPHATE 10 MG/ML IJ SOLN
INTRAMUSCULAR | Status: DC | PRN
  Administered 2024-08-09: 10 mg via INTRAVENOUS

## 2024-08-09 MED ORDER — 0.9 % SODIUM CHLORIDE (POUR BTL) OPTIME
TOPICAL | Status: DC | PRN
  Administered 2024-08-09: 500 mL

## 2024-08-09 MED ORDER — POVIDONE-IODINE 10 % EX SWAB: 2.0000 | Freq: Once | CUTANEOUS | Status: DC

## 2024-08-09 MED ORDER — OXYCODONE-ACETAMINOPHEN 7.5-325 MG PO TABS: 1.0000 | ORAL_TABLET | Freq: Four times a day (QID) | ORAL | 0 refills | Status: DC | PRN

## 2024-08-09 MED ORDER — PROPOFOL 10 MG/ML IV BOLUS
INTRAVENOUS | Status: AC
Start: 2024-08-09 — End: 2024-08-09
  Filled 2024-08-09: qty 20

## 2024-08-09 MED ORDER — PROPOFOL 10 MG/ML IV BOLUS
INTRAVENOUS | Status: DC | PRN
  Administered 2024-08-09: 200 mg via INTRAVENOUS

## 2024-08-09 MED ORDER — FENTANYL CITRATE PF 50 MCG/ML IJ SOSY
50.0000 ug | PREFILLED_SYRINGE | Freq: Once | INTRAMUSCULAR | Status: AC
  Administered 2024-08-09: 50 ug via INTRAVENOUS

## 2024-08-09 MED ORDER — ONDANSETRON 8 MG PO TBDP: 8.0000 mg | ORAL_TABLET | Freq: Three times a day (TID) | ORAL | 0 refills | Status: AC | PRN

## 2024-08-09 MED ORDER — FENTANYL CITRATE (PF) 100 MCG/2ML IJ SOLN
INTRAMUSCULAR | Status: DC | PRN
Start: 2024-08-09 — End: 2024-08-09
  Administered 2024-08-09 (×6): 50 ug via INTRAVENOUS

## 2024-08-09 MED ORDER — SUGAMMADEX SODIUM 200 MG/2ML IV SOLN
INTRAVENOUS | Status: DC | PRN
  Administered 2024-08-09: 200 mg via INTRAVENOUS

## 2024-08-09 MED ORDER — SCOPOLAMINE 1 MG/3DAYS TD PT72
MEDICATED_PATCH | TRANSDERMAL | Status: AC
  Filled 2024-08-09: qty 1

## 2024-08-09 MED ORDER — STERILE WATER FOR IRRIGATION IR SOLN
Status: DC | PRN
Start: 2024-08-09 — End: 2024-08-09
  Administered 2024-08-09: 500 mL

## 2024-08-09 MED ORDER — LABETALOL HCL 5 MG/ML IV SOLN
INTRAVENOUS | Status: DC | PRN
  Administered 2024-08-09: 5 mg via INTRAVENOUS

## 2024-08-09 MED ORDER — FENTANYL CITRATE (PF) 100 MCG/2ML IJ SOLN
INTRAMUSCULAR | Status: AC
  Filled 2024-08-09: qty 2

## 2024-08-09 MED ORDER — CHLORHEXIDINE GLUCONATE 0.12 % MT SOLN
OROMUCOSAL | Status: AC
  Filled 2024-08-09: qty 15

## 2024-08-09 MED ORDER — KETOROLAC TROMETHAMINE 10 MG PO TABS: 10.0000 mg | ORAL_TABLET | Freq: Three times a day (TID) | ORAL | 0 refills | Status: AC | PRN

## 2024-08-09 MED ORDER — DEXAMETHASONE SODIUM PHOSPHATE 10 MG/ML IJ SOLN
INTRAMUSCULAR | Status: AC
Start: 2024-08-09 — End: 2024-08-09
  Filled 2024-08-09: qty 1

## 2024-08-09 MED ORDER — FENTANYL CITRATE PF 50 MCG/ML IJ SOSY
PREFILLED_SYRINGE | INTRAMUSCULAR | Status: AC
  Filled 2024-08-09: qty 1

## 2024-08-09 MED ORDER — DEXMEDETOMIDINE HCL IN NACL 80 MCG/20ML IV SOLN
INTRAVENOUS | Status: DC | PRN
  Administered 2024-08-09 (×5): 4 ug via INTRAVENOUS

## 2024-08-09 MED ORDER — LACTATED RINGERS IV SOLN: INTRAVENOUS | Status: DC | PRN

## 2024-08-09 MED ORDER — SODIUM CHLORIDE 0.9 % IR SOLN
Status: DC | PRN
  Administered 2024-08-09: 3000 mL

## 2024-08-09 MED ORDER — ORAL CARE MOUTH RINSE: 15.0000 mL | Freq: Once | OROMUCOSAL | Status: DC

## 2024-08-09 MED ORDER — LACTATED RINGERS IV SOLN: INTRAVENOUS | Status: DC

## 2024-08-09 MED ORDER — KETOROLAC TROMETHAMINE 30 MG/ML IJ SOLN
30.0000 mg | INTRAMUSCULAR | Status: AC
  Administered 2024-08-09: 30 mg via INTRAVENOUS
  Filled 2024-08-09: qty 1

## 2024-08-09 MED ORDER — ONDANSETRON HCL 4 MG/2ML IJ SOLN
INTRAMUSCULAR | Status: AC
  Filled 2024-08-09: qty 2

## 2024-08-09 MED ORDER — SCOPOLAMINE 1 MG/3DAYS TD PT72
1.0000 | MEDICATED_PATCH | TRANSDERMAL | Status: DC
  Administered 2024-08-09: 1 mg via TRANSDERMAL

## 2024-08-09 SURGICAL SUPPLY — 53 items
BLADE SURG SZ11 CARB STEEL (BLADE) ×1 IMPLANT
CAUTERY HOOK MNPLR 1.6 DVNC XI (INSTRUMENTS) ×1 IMPLANT
COVER LIGHT HANDLE STERIS (MISCELLANEOUS) ×2 IMPLANT
COVER MAYO STAND XLG (MISCELLANEOUS) ×1 IMPLANT
COVER TIP SHEARS 8 DVNC (MISCELLANEOUS) ×1 IMPLANT
DERMABOND ADVANCED .7 DNX12 (GAUZE/BANDAGES/DRESSINGS) ×1 IMPLANT
DRAPE ARM DVNC X/XI (DISPOSABLE) ×4 IMPLANT
DRAPE COLUMN DVNC XI (DISPOSABLE) ×1 IMPLANT
DRIVER NDL MEGA SUTCUT DVNCXI (INSTRUMENTS) ×1 IMPLANT
DRIVER NDLE MEGA SUTCUT DVNCXI (INSTRUMENTS) ×1 IMPLANT
DRSG TEGADERM 4X4.75 (GAUZE/BANDAGES/DRESSINGS) IMPLANT
ELECTRODE REM PT RTRN 9FT ADLT (ELECTROSURGICAL) ×1 IMPLANT
FORCEPS PROGRASP DVNC XI (FORCEP) ×1 IMPLANT
GAUZE 4X4 16PLY ~~LOC~~+RFID DBL (SPONGE) ×2 IMPLANT
GLOVE BIOGEL PI IND STRL 7.0 (GLOVE) ×3 IMPLANT
GLOVE BIOGEL PI IND STRL 8 (GLOVE) ×2 IMPLANT
GLOVE ECLIPSE 8.0 STRL XLNG CF (GLOVE) ×3 IMPLANT
GOWN STRL REUS W/TWL LRG LVL3 (GOWN DISPOSABLE) ×2 IMPLANT
GOWN STRL REUS W/TWL XL LVL3 (GOWN DISPOSABLE) ×2 IMPLANT
KIT PINK PAD W/HEAD ARM REST (MISCELLANEOUS) ×1 IMPLANT
KIT TURNOVER CYSTO (KITS) ×1 IMPLANT
MANIFOLD NEPTUNE II (INSTRUMENTS) ×1 IMPLANT
NDL HYPO 21X1.5 SAFETY (NEEDLE) ×1 IMPLANT
NDL INSUFFLATION 14GA 120MM (NEEDLE) ×1 IMPLANT
NEEDLE HYPO 21X1.5 SAFETY (NEEDLE) ×1 IMPLANT
NEEDLE INSUFFLATION 14GA 120MM (NEEDLE) ×1 IMPLANT
NS IRRIG 500ML POUR BTL (IV SOLUTION) ×1 IMPLANT
OBTURATOR OPTICALSTD 8 DVNC (TROCAR) ×1 IMPLANT
PACK PERI GYN (CUSTOM PROCEDURE TRAY) ×1 IMPLANT
POSITIONER HEAD 8X9X4 ADT (SOFTGOODS) ×1 IMPLANT
RUMI II 3.0CM BLUE KOH-EFFICIE (DISPOSABLE) IMPLANT
RUMI II GYRUS 3.5CM BLUE (DISPOSABLE) IMPLANT
SEAL UNIV 5-12 XI (MISCELLANEOUS) ×3 IMPLANT
SEALER VESSEL EXT DVNC XI (MISCELLANEOUS) ×1 IMPLANT
SET BASIN LINEN APH (SET/KITS/TRAYS/PACK) ×1 IMPLANT
SET TRI-LUMEN FLTR TB AIRSEAL (TUBING) ×1 IMPLANT
SET TUBE DA VINCI INSUFFLATOR (TUBING) IMPLANT
SET TUBE IRRIG SUCTION NO TIP (IRRIGATION / IRRIGATOR) IMPLANT
SOL ANTI FOG 6CC (MISCELLANEOUS) ×1 IMPLANT
SPONGE T-LAP 18X18 ~~LOC~~+RFID (SPONGE) ×1 IMPLANT
SUT VIC AB 0 CT1 27XBRD ANTBC (SUTURE) IMPLANT
SUT VICRYL 0 UR6 27IN ABS (SUTURE) ×1 IMPLANT
SUT VICRYL 3 0 (SUTURE) IMPLANT
SUT VICRYL AB 3-0 FS1 BRD 27IN (SUTURE) ×2 IMPLANT
SUTURE STRATFX 0 PDS+ CT-2 23 (SUTURE) ×1 IMPLANT
SYR 10ML LL (SYRINGE) ×2 IMPLANT
SYR 20ML LL LF (SYRINGE) ×1 IMPLANT
SYR 50ML LL SCALE MARK (SYRINGE) ×2 IMPLANT
SYR CONTROL 10ML LL (SYRINGE) ×2 IMPLANT
TIP UTERINE 6.7X10CM GRN DISP (MISCELLANEOUS) IMPLANT
TRAY FOL W/BAG SLVR 16FR STRL (SET/KITS/TRAYS/PACK) ×1 IMPLANT
TROCAR PORT AIRSEAL 8X120 (TROCAR) ×1 IMPLANT
WATER STERILE IRR 500ML POUR (IV SOLUTION) ×1 IMPLANT

## 2024-08-09 NOTE — Op Note (Signed)
 Preoperative diagnosis: 450 cc fibroid uterus, by most recent sonogram Dysmenorrhea Iron  deficiency anemia   Postoperative diagnosis: SAA   Procedure: Xi Robotic hysterectomy with removal of both Fallopian tubes   Laparoscopic guided transversus abdominus plane block for post op pain management  Surgeon: Vonn VEAR Inch, MD   Anesthesia: General endotracheal   Findings: Enlarged fibroid uterus No other abnormalities   Description of operation: Patient was taken to the operating room and placed in the low lithotomy position She was prepped and draped in the usual sterile fashion robotic assisted laparoscopic procedure A Foley catheter was placed The vagina was once again prepped additionally   A RUMI II 10 cm with 2.5 cervical cup was placed for uterine manipulation and colpotomy delineation   An incision was made above the umbilicus The umbilical fascia was grasped A varies needle was used and placed into the peritoneum with 1 pass A pneumoperitoneum was created to a pressure of 15   An 8 mm port was placed into the peritoneal cavity using a nonbladed trocar easily with 1 pass using the video laparoscope The peritoneal cavity was confirmed   There were no unusual findings 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   4 additional 8 mm ports were placed at approximately the same level as the supraumbilical port 3 of the ports were robotic and 1 is an assist port   One was left lateral and 1 was placed right lateral, both lateral to the rectus anterior muscle These were placed under direct visualization without difficulty into the peritoneal cavity Nonbladed trocars were used in all instances    The patient was placed in 24 degrees of Trendelenburg   The [employer] robot was then docked to the 3 robotic ports   Instruments used during the robotic hysterectomy: Vessel sealer extender using bipolar energy ProGrasp forceps with  no energy Monopolar hook Megacut needle driver 2-0 PDS symmetrical STRATAFIX on a CT 2 needle   I then left the patient and went to the surgical console   The RUMI II  was used throughout the case to apply traction and anterior/posterior displacement of the uterus to facilitate the robotic procedure The ProGrasp was used to put traction on the left adnexa The left ureter was identified and found to be well away from the adnexal vessels The vessel sealer extender using bipolar energy was used and the utero-ovarian ligament was coagulated and then transected I used traction medially and anteriorly and used the vessel sealer to take the broad ligament and round ligament down to the level of the cervical isthmus just lateral to the left uterine vessels   I then turned my attention to the right adnexa The pro grasp was used and medial and upward traction was placed The vessel sealer extender using bipolar energy was used to coagulate and ligate the right infundibulopelvic ligament vessels Again the right ureter was well inferior to the vessels I continued use medial and anterior retraction using the ProGrasp and the vessel sealer with the bipolar energy was used to take the broad ligament and round ligament down to the level of the cervical isthmus and lateral to the uterine vessels   I then placed the monopolar hook in the place of the ProGrasp The vessel sealer extender was used to grasp the peritoneum of the bladder provide traction, of course no energy was used for this I used the monopolar hook with energy to open of the peritoneal leaf  anteriorly and dissect the bladder peritoneum off the lower uterine segment and cervix beyond the level of the vaginal colpotomy cup I then used the monopolar hook to take the peritoneum down where I stopped using the vessel sealer and met in the bladder dissection bilaterally   The vessel sealer extender with monopolar energy was used to coagulate the uterine  vessels at the level of the cervical isthmus bilaterally and then just above and just below to manage backbleeding The uterine vessels were transected There was good hemostasis I then stayed medial to this uterine vessel pedicle with the vessel sealer extender and took down the paracervical tissue to allow colpotomy incision using the monopolar hook, preserving the cardinal ligament Again there was good hemostasis bilaterally   I then used the monopolar hook and made a posterior colpotomy incision above the level of insertion of the uterosacral ligaments I used a frowny face then smiley face technique and opened the vagina to approximately 8:00 and 4:00 posteriorly I then used the vessel sealer extender with bipolar energy, coagulating and then transecting the vagina   The monopolar hook was used to make the anterior colpotomy incision as the bladder had been dissected well past the colpotomy ring Cephalad tension was placed on the Vcare handle throughout this portion of the procedure to prevent thermal injury to the bladder and safe distance from the ureters bilaterally Colpotomy incision was made from 10:00 to 2:00 The vessel sealer with bipolar energy was then used to coagulate and transect the vagina, again inside of the cardinal ligament   The uterus was removed through the enlared umbilical incision   The Fallopian tubes  were removed through the assist port A wet lap tape was placed in the vagina to maintain pneumoperitoneum   All pedicles were found to be hemostatic there was very little blood loss I then removed the vessel sealer and monopolar hook The pro grasp was replaced and the needle driver was also placed along with the suture   The vagina was closed using the 2-0 PDS STRATAFIX symmetrical suture on the CT 2 needle I pay close attention to the vaginal corners bilaterally and incorporated those in the closure 1.5 cm of vagina was operating to the vaginal closure I also fixed  the uterosacral ligaments to the vaginal cuff repair shortening the uterosacral ligaments thus providing improved vaginal vault suspension The cardinal ligaments were intact again improving postoperative vaginal support Pubocervical rectovaginal and posterior peritoneum were all included in the vaginal closure There was good result hemostasis   At the end of the procedure all the pedicles were identified and found to be hemostatic Both ureters were identified and undergoing normal peristalsis, they were well out of the way of surgical field throughout   I then got up from the console and went back to the side of the patient after gowning and gloving sterilely  I placed a  transversus abdominus plane block was placed using laparoscopic guidance at T10 and T7 bilaterally, 10 cc of 0.25% bupivacaine  at each site, 40 cc total of 100 mg of bupivacaine .  Doyle's bubble technique was used. This was placed for post operative pain management.       The 5 robotic ports were removed The insufflation ports were used to actively desufflate the peritoneal cavity to a pressure of 0 Ports were then removed   The umbilical fascia was closed with 0 vicryl running with good closure The subcutaneous tissue of all 5 incisions was closed using 0  Vicryl All 5 skin incisions were closed using 3-0 vicryl in a subcuticular manner Dermabond was used all 5 incision sites  Each incision was dressed   The patient tolerated the procedure well She received 2 g of Ancef  and 30 mg of Toradol  preoperatively   EBL: 50 cc   Vonn VEAR Inch, MD 08/09/2024 12:56 PM

## 2024-08-09 NOTE — H&P (Signed)
 Preoperative History and Physical  Lynn Wilson is a 43 y.o. G0P0000 with Patient's last menstrual period was 08/04/2024. admitted for a RA TLH + BS.   Pt continues to have bleeding nearly every day, maybe skips a day or two evn on the continuous megestrol  therapy Also continues to have back and pelvic pain due to the fibroids, increased pain when bleeds but essentially has daily pain form the fibroids   As a result with her continued issues on megestrol  patient wants to proceed with definitive surgical therapy  PMH:    Past Medical History:  Diagnosis Date   GERD (gastroesophageal reflux disease)    Hypertension    Hypothyroid    Hypothyroid 01/25/2018   Iron  deficiency anemia due to chronic blood loss 02/16/2018    PSH:     Past Surgical History:  Procedure Laterality Date   COLONOSCOPY WITH PROPOFOL  N/A 09/08/2018   Procedure: COLONOSCOPY WITH PROPOFOL ;  Surgeon: Unk Corinn Skiff, MD;  Location: [facility] ENDOSCOPY;  Service: Gastroenterology;  Laterality: N/A;   ESOPHAGOGASTRODUODENOSCOPY (EGD) WITH PROPOFOL  N/A 09/08/2018   Procedure: ESOPHAGOGASTRODUODENOSCOPY (EGD) WITH PROPOFOL ;  Surgeon: Unk Corinn Skiff, MD;  Location: [facility] ENDOSCOPY;  Service: Gastroenterology;  Laterality: N/A;    POb/GynH:      OB History     Gravida  0   Para  0   Term  0   Preterm  0   AB  0   Living  0      SAB  0   IAB  0   Ectopic  0   Multiple  0   Live Births  0           SH:   Social History   Tobacco Use   Smoking status: Every Day    Current packs/day: 0.75    Average packs/day: 0.8 packs/day for 24.5 years (18.4 ttl pk-yrs)    Types: Cigarettes    Start date: 01/26/2000    Passive exposure: Never   Smokeless tobacco: Never  Vaping Use   Vaping status: Never Used  Substance Use Topics   Alcohol use: Yes    Alcohol/week: 2.0 standard drinks of alcohol    Types: 2 Cans of beer per week   Drug use: No    FH:    Family History  Problem Relation  Age of Onset   Hypertension Mother    Hypertension Father    Stroke Father      Allergies:  Allergies  Allergen Reactions   Celecoxib     shakes/tremors Other reaction(s): other   Sesame Seed (Diagnostic) Itching   Lisinopril Rash    Medications:       Current Facility-Administered Medications:    ceFAZolin  (ANCEF ) IVPB 2g/100 mL premix, 2 g, Intravenous, On Call to OR, Wilson Lynn DEL, MD   chlorhexidine  (PERIDEX ) 0.12 % solution, , , ,    povidone-iodine  10 % swab 2 Application, 2 Application, Topical, Once, Wilson Lynn DEL, MD  Review of Systems:   Review of Systems  Constitutional: Negative for fever, chills, weight loss, malaise/fatigue and diaphoresis.  HENT: Negative for hearing loss, ear pain, nosebleeds, congestion, sore throat, neck pain, tinnitus and ear discharge.   Eyes: Negative for blurred vision, double vision, photophobia, pain, discharge and redness.  Respiratory: Negative for cough, hemoptysis, sputum production, shortness of breath, wheezing and stridor.   Cardiovascular: Negative for chest pain, palpitations, orthopnea, claudication, leg swelling and PND.  Gastrointestinal: Positive for abdominal pain. Negative for heartburn, nausea,  vomiting, diarrhea, constipation, blood in stool and melena.  Genitourinary: Negative for dysuria, urgency, frequency, hematuria and flank pain.  Musculoskeletal: Negative for myalgias, back pain, joint pain and falls.  Skin: Negative for itching and rash.  Neurological: Negative for dizziness, tingling, tremors, sensory change, speech change, focal weakness, seizures, loss of consciousness, weakness and headaches.  Endo/Heme/Allergies: Negative for environmental allergies and polydipsia. Does not bruise/bleed easily.  Psychiatric/Behavioral: Negative for depression, suicidal ideas, hallucinations, memory loss and substance abuse. The patient is not nervous/anxious and does not have insomnia.      PHYSICAL EXAM:  Blood  pressure (!) 159/92, pulse 83, temperature 98.6 F (37 C), resp. rate 20, last menstrual period 08/04/2024, SpO2 100%.    Vitals reviewed. Constitutional: She is oriented to person, place, and time. She appears well-developed and well-nourished.  HENT:  Head: Normocephalic and atraumatic.  Right Ear: External ear normal.  Left Ear: External ear normal.  Nose: Nose normal.  Mouth/Throat: Oropharynx is clear and moist.  Eyes: Conjunctivae and EOM are normal. Pupils are equal, round, and reactive to light. Right eye exhibits no discharge. Left eye exhibits no discharge. No scleral icterus.  Neck: Normal range of motion. Neck supple. No tracheal deviation present. No thyromegaly present.  Cardiovascular: Normal rate, regular rhythm, normal heart sounds and intact distal pulses.  Exam reveals no gallop and no friction rub.   No murmur heard. Respiratory: Effort normal and breath sounds normal. No respiratory distress. She has no wheezes. She has no rales. She exhibits no tenderness.  GI: Soft. Bowel sounds are normal. She exhibits no distension and no mass. There is tenderness. There is no rebound and no guarding.  Genitourinary:       Vulva is normal without lesions Vagina is pink moist without discharge Cervix normal in appearance and pap is normal Uterus is 12 weeks size mobile Adnexa is negative with normal sized ovaries by sonogram  Musculoskeletal: Normal range of motion. She exhibits no edema and no tenderness.  Neurological: She is alert and oriented to person, place, and time. She has normal reflexes. She displays normal reflexes. No cranial nerve deficit. She exhibits normal muscle tone. Coordination normal.  Skin: Skin is warm and dry. No rash noted. No erythema. No pallor.  Psychiatric: She has a normal mood and affect. Her behavior is normal. Judgment and thought content normal.    Labs: Results for orders placed or performed during the hospital encounter of 08/07/24 (from the  past 2 weeks)  CBC   Collection Time: 08/07/24  9:42 AM  Result Value Ref Range   WBC 7.1 4.0 - 10.5 K/uL   RBC 5.03 3.87 - 5.11 MIL/uL   Hemoglobin 11.4 (L) 12.0 - 15.0 g/dL   HCT 63.8 63.9 - 53.9 %   MCV 71.8 (L) 80.0 - 100.0 fL   MCH 22.7 (L) 26.0 - 34.0 pg   MCHC 31.6 30.0 - 36.0 g/dL   RDW 81.6 (H) 88.4 - 84.4 %   Platelets 428 (H) 150 - 400 K/uL   nRBC 0.0 0.0 - 0.2 %  Comprehensive metabolic panel   Collection Time: 08/07/24  9:42 AM  Result Value Ref Range   Sodium 136 135 - 145 mmol/L   Potassium 3.4 (L) 3.5 - 5.1 mmol/L   Chloride 105 98 - 111 mmol/L   CO2 21 (L) 22 - 32 mmol/L   Glucose, Bld 96 70 - 99 mg/dL   BUN 15 6 - 20 mg/dL   Creatinine, Ser 9.18 0.44 -  1.00 mg/dL   Calcium 9.3 8.9 - 89.6 mg/dL   Total Protein 8.3 (H) 6.5 - 8.1 g/dL   Albumin 4.0 3.5 - 5.0 g/dL   AST 33 15 - 41 U/L   ALT 27 0 - 44 U/L   Alkaline Phosphatase 77 38 - 126 U/L   Total Bilirubin 0.5 0.0 - 1.2 mg/dL   GFR, Estimated >39 >39 mL/min   Anion gap 10 5 - 15  Rapid HIV screen (HIV 1/2 Ab+Ag)   Collection Time: 08/07/24  9:42 AM  Result Value Ref Range   HIV-1 P24 Antigen - HIV24 NON REACTIVE NON REACTIVE   HIV 1/2 Antibodies NON REACTIVE NON REACTIVE   Interpretation (HIV Ag Ab)      A non reactive test result means that HIV 1 or HIV 2 antibodies and HIV 1 p24 antigen were not detected in the specimen.  Urinalysis, Routine w reflex microscopic -Urine, Clean Catch   Collection Time: 08/07/24  9:42 AM  Result Value Ref Range   Color, Urine YELLOW YELLOW   APPearance CLEAR CLEAR   Specific Gravity, Urine 1.013 1.005 - 1.030   pH 6.0 5.0 - 8.0   Glucose, UA NEGATIVE NEGATIVE mg/dL   Hgb urine dipstick LARGE (A) NEGATIVE   Bilirubin Urine NEGATIVE NEGATIVE   Ketones, ur NEGATIVE NEGATIVE mg/dL   Protein, ur NEGATIVE NEGATIVE mg/dL   Nitrite NEGATIVE NEGATIVE   Leukocytes,Ua NEGATIVE NEGATIVE   RBC / HPF 0-5 0 - 5 RBC/hpf   WBC, UA 0-5 0 - 5 WBC/hpf   Bacteria, UA NONE SEEN NONE  SEEN   Squamous Epithelial / HPF 0-5 0 - 5 /HPF   Mucus PRESENT    Hyaline Casts, UA PRESENT   Pregnancy, urine   Collection Time: 08/07/24  9:42 AM  Result Value Ref Range   Preg Test, Ur NEGATIVE NEGATIVE  Type and screen [facility]   Collection Time: 08/07/24  9:42 AM  Result Value Ref Range   ABO/RH(D) O POS    Antibody Screen NEG    Sample Expiration      08/10/2024,2359 Performed at [facility] , [address], [place], [place] [address]     EKG: Orders placed or performed in visit on 10/20/22   EKG 12-Lead    Imaging Studies: MM 3D SCREENING MAMMOGRAM BILATERAL BREAST Result Date: 07/20/2024 CLINICAL DATA:  Screening. EXAM: DIGITAL SCREENING BILATERAL MAMMOGRAM WITH TOMOSYNTHESIS AND CAD TECHNIQUE: Bilateral screening digital craniocaudal and mediolateral oblique mammograms were obtained. Bilateral screening digital breast tomosynthesis was performed. The images were evaluated with computer-aided detection. COMPARISON:  None available. ACR Breast Density Category b: There are scattered areas of fibroglandular density. FINDINGS: There are no findings suspicious for malignancy. IMPRESSION: No mammographic evidence of malignancy. A result letter of this screening mammogram will be mailed directly to the patient. RECOMMENDATION: Screening mammogram in one year. (Code:SM-B-01Y) BI-RADS CATEGORY  1: Negative. Electronically Signed   By: Reyes Phi M.D.   On: 07/20/2024 16:23   [facility] [address] [place] [address]       Ordering Provider: Jayne Lynn DEL, MD  LIMITED PELVIC US  WITH ULTRASOUND GUIDANCE FOR IUD REMOVAL     Lynn Wilson is a 43 y.o. G0P0000 No LMP recorded. (Menstrual status: IUD).She is here for a pelvic sonogram for IUD removal.   Uterus                      9.9 x 8.8 x 9.4 cm, Enlarged heterogeneous uterus with multiple fibroids,uterine  vol.427 ml, IUD appears to be centrally located within the uterus         Technician Comments:   LIMITED PELVIC US  WITH ULTRASOUND GUIDANCE FOR IUD REMOVAL : Enlarged heterogeneous uterus with multiple fibroids,uterine vol.427 ml,IUD appears to be centrally located within the uterus,unsuccessful removal IUD with ultrasound guidance,performed by Dr Wilson Ector Palma   Lynn Wilson 01/10/2024 12:41 PM   Clinical Impression and recommendations:   I have reviewed the sonogram results above, combined with the patient's current clinical course, below are my impressions and any appropriate recommendations for management based on the sonographic findings.   Used US  guidance for removal of IUD without success, the fibroids are making it difficult and I can grab it with sonogram guidance but am unable to extract it   Lynn Wilson 01/24/2024 10:00 AM     Assessment: 1. Fibroids, uterus 266 cc, in nulliparous woman  D21.9       2. Dysmenorrhea  N94.6       3. Symptomatic anemia  D64.9      Plan: RA TLH + BS  Pt understands the risks of surgery including but not limited t  excessive bleeding requiring transfusion or reoperation, post-operative infection requiring prolonged hospitalization or re-hospitalization and antibiotic therapy, and damage to other organs including bladder, bowel, ureters and major vessels.  The patient also understands the alternative treatment options which were discussed in full.  All questions were answered.  Lynn Wilson 08/09/2024 8:16 AM   Lynn Wilson 08/09/2024 8:14 AM

## 2024-08-09 NOTE — Anesthesia Preprocedure Evaluation (Addendum)
 Anesthesia Evaluation  Patient identified by MRN, date of birth, ID band Patient awake    Reviewed: Allergy & Precautions, H&P , NPO status , Patient's Chart, lab work & pertinent test results, reviewed documented beta blocker date and time   Airway Mallampati: II  TM Distance: >3 FB Neck ROM: full    Dental no notable dental hx.    Pulmonary Current Smoker   Pulmonary exam normal breath sounds clear to auscultation       Cardiovascular Exercise Tolerance: Good hypertension,  Rhythm:regular Rate:Normal     Neuro/Psych  Headaches  negative psych ROS   GI/Hepatic Neg liver ROS,GERD  ,,  Endo/Other  Hypothyroidism  Class 3 obesity  Renal/GU negative Renal ROS  negative genitourinary   Musculoskeletal   Abdominal   Peds  Hematology  (+) Blood dyscrasia, anemia   Anesthesia Other Findings   Reproductive/Obstetrics negative OB ROS                              Anesthesia Physical Anesthesia Plan  ASA: 2  Anesthesia Plan: General and General ETT   Post-op Pain Management:    Induction:   PONV Risk Score and Plan: Ondansetron  and Scopolamine  patch - Pre-op  Airway Management Planned:   Additional Equipment:   Intra-op Plan:   Post-operative Plan:   Informed Consent: I have reviewed the patients History and Physical, chart, labs and discussed the procedure including the risks, benefits and alternatives for the proposed anesthesia with the patient or authorized representative who has indicated his/her understanding and acceptance.     Dental Advisory Given  Plan Discussed with: CRNA  Anesthesia Plan Comments:          Anesthesia Quick Evaluation

## 2024-08-09 NOTE — Anesthesia Procedure Notes (Signed)
 Procedure Name: Intubation Date/Time: 08/09/2024 8:47 AM  Performed by: Canda Done, CRNAPre-anesthesia Checklist: Patient identified, Emergency Drugs available, Suction available and Patient being monitored Patient Re-evaluated:Patient Re-evaluated prior to induction Oxygen Delivery Method: Circle system utilized Preoxygenation: Pre-oxygenation with 100% oxygen Induction Type: IV induction Ventilation: Mask ventilation without difficulty Laryngoscope Size: Miller and 2 Grade View: Grade I Tube type: Oral Tube size: 7.0 mm Number of attempts: 1 Airway Equipment and Method: Stylet Placement Confirmation: ETT inserted through vocal cords under direct vision, positive ETCO2 and breath sounds checked- equal and bilateral Secured at: 22 cm Tube secured with: Tape Dental Injury: Teeth and Oropharynx as per pre-operative assessment  Comments: With ease

## 2024-08-09 NOTE — Transfer of Care (Signed)
 Immediate Anesthesia Transfer of Care Note  Patient: Asante L Statler  Procedure(s) Performed: HYSTERECTOMY, TOTAL, LAPAROSCOPIC, ROBOT-ASSISTED WITH SALPINGECTOMY (Bilateral: Abdomen)  Patient Location: PACU  Anesthesia Type:General  Level of Consciousness: awake, alert , oriented, and patient cooperative  Airway & Oxygen Therapy: Patient Spontanous Breathing  Post-op Assessment: Report given to RN, Post -op Vital signs reviewed and stable, and Patient moving all extremities X 4  Post vital signs: Reviewed and stable  Last Vitals:  Vitals Value Taken Time  BP 130/80   Temp 98   Pulse 97 08/09/24 12:39  Resp 20 08/09/24 12:39  SpO2 100 % 08/09/24 12:39  Vitals shown include unfiled device data.  Last Pain:  Vitals:   08/09/24 0736  PainSc: 6          Complications: No notable events documented.

## 2024-08-10 NOTE — Anesthesia Postprocedure Evaluation (Signed)
 Anesthesia Post Note  Patient: Lynn Wilson  Procedure(s) Performed: HYSTERECTOMY, TOTAL, LAPAROSCOPIC, ROBOT-ASSISTED WITH SALPINGECTOMY (Bilateral: Abdomen)  Patient location during evaluation: Phase II Anesthesia Type: General Level of consciousness: awake Pain management: pain level controlled Vital Signs Assessment: post-procedure vital signs reviewed and stable Respiratory status: spontaneous breathing and respiratory function stable Cardiovascular status: blood pressure returned to baseline and stable Postop Assessment: no headache and no apparent nausea or vomiting Anesthetic complications: no Comments: Late entry   No notable events documented.   Last Vitals:  Vitals:   08/09/24 1330 08/09/24 1339  BP: 123/77 137/82  Pulse: 77 82  Resp: (!) 23   Temp:  (!) 36.4 C  SpO2: 100% 100%    Last Pain:  Vitals:   08/09/24 1339  TempSrc: Oral  PainSc: 2                  Yvonna JINNY Bosworth

## 2024-08-16 LAB — SURGICAL PATHOLOGY

## 2024-08-17 ENCOUNTER — Encounter: Payer: Self-pay | Admitting: Obstetrics & Gynecology

## 2024-08-17 ENCOUNTER — Ambulatory Visit: Admitting: Obstetrics & Gynecology

## 2024-08-17 VITALS — BP 138/89 | HR 84

## 2024-08-17 DIAGNOSIS — Z48816 Encounter for surgical aftercare following surgery on the genitourinary system: Secondary | ICD-10-CM

## 2024-08-17 DIAGNOSIS — Z9889 Other specified postprocedural states: Secondary | ICD-10-CM

## 2024-08-17 NOTE — Progress Notes (Signed)
  HPI: Patient returns for routine postoperative follow-up having undergone RA TLH + BS 08/09/24.  The patient's immediate postoperative recovery has been unremarkable. Since hospital discharge the patient reports no problems.   Current Outpatient Medications: dicyclomine  (BENTYL ) 20 MG tablet, Take 1 tablet (20 mg total) by mouth 3 (three) times daily as needed for spasms (abd cramping)., Disp: 60 tablet, Rfl: 1 hydrochlorothiazide  (HYDRODIURIL ) 25 MG tablet, Take 1 tablet (25 mg total) by mouth daily., Disp: 90 tablet, Rfl: 0 levothyroxine  (SYNTHROID ) 100 MCG tablet, Take 1 tablet (100 mcg total) by mouth every morning., Disp: 90 tablet, Rfl: 0 oxyCODONE -acetaminophen  (PERCOCET) 7.5-325 MG tablet, Take 1 tablet by mouth every 6 (six) hours as needed for severe pain (pain score 7-10)., Disp: 28 tablet, Rfl: 0 rosuvastatin (CRESTOR) 5 MG tablet, Take 5 mg by mouth at bedtime., Disp: , Rfl:  amLODipine  (NORVASC ) 2.5 MG tablet, Take 1 tablet (2.5 mg total) by mouth daily. (Patient not taking: Reported on 08/17/2024), Disp: 90 tablet, Rfl: 1 baclofen  (LIORESAL ) 10 MG tablet, Take 1 tablet (10 mg total) by mouth 3 (three) times daily. For headaches (Patient not taking: Reported on 08/17/2024), Disp: 30 each, Rfl: 0 ketorolac  (TORADOL ) 10 MG tablet, Take 1 tablet (10 mg total) by mouth every 8 (eight) hours as needed. (Patient not taking: Reported on 08/17/2024), Disp: 15 tablet, Rfl: 0 ketorolac  (TORADOL ) 10 MG tablet, Take 1 tablet (10 mg total) by mouth every 8 (eight) hours as needed. (Patient not taking: Reported on 08/17/2024), Disp: 15 tablet, Rfl: 0 ondansetron  (ZOFRAN -ODT) 8 MG disintegrating tablet, Take 1 tablet (8 mg total) by mouth every 8 (eight) hours as needed for nausea or vomiting. (Patient not taking: Reported on 08/17/2024), Disp: 8 tablet, Rfl: 0 pantoprazole  (PROTONIX ) 40 MG tablet, Take 1 tablet (40 mg total) by mouth 2 (two) times daily. (Patient not taking: Reported on 08/17/2024), Disp: 60  tablet, Rfl: 0  No current facility-administered medications for this visit.    Blood pressure 138/89, pulse 84, last menstrual period 08/04/2024.  Physical Exam: 5 incisions dry and healing well  Diagnostic Tests:   Pathology: Benign IUD in uterus  Impression + Management plan:   ICD-10-CM   1. Post-operative state: RA TLH + BS 08/09/24  Z98.890           Medications Prescribed this encounter: No orders of the defined types were placed in this encounter.     Follow up: Return in about 6 weeks (around 09/28/2024) for Post Op, with Dr Jayne.    Vonn VEAR Jayne, MD Attending Physician for the Center for [facility] 08/17/2024 12:01 PM

## 2024-08-18 MED ORDER — OXYCODONE-ACETAMINOPHEN 5-325 MG PO TABS: 1.0000 | ORAL_TABLET | ORAL | 0 refills | Status: AC | PRN

## 2024-09-18 ENCOUNTER — Telehealth: Payer: Self-pay | Admitting: *Deleted

## 2024-09-18 ENCOUNTER — Encounter: Payer: Self-pay | Admitting: Obstetrics & Gynecology

## 2024-09-18 NOTE — Telephone Encounter (Signed)
 Patient requesting additional Toradol  as she is still having umbilical pain radiating down to vagina. She is taking 800mg  Ibuprofen  and Tylenol  with minimal relief. Took last dose last night.  Please advise.

## 2024-09-19 MED ORDER — KETOROLAC TROMETHAMINE 10 MG PO TABS: 10.0000 mg | ORAL_TABLET | Freq: Three times a day (TID) | ORAL | 0 refills | Status: AC | PRN

## 2024-09-19 NOTE — Addendum Note (Signed)
 Addended by: JAYNE VONN DEL on: 09/19/2024 10:12 AM   Modules accepted: Orders

## 2024-09-28 ENCOUNTER — Encounter: Admitting: Obstetrics & Gynecology

## 2024-10-02 ENCOUNTER — Ambulatory Visit: Admitting: Obstetrics & Gynecology

## 2024-10-02 ENCOUNTER — Encounter: Payer: Self-pay | Admitting: Obstetrics & Gynecology

## 2024-10-02 VITALS — BP 135/87 | HR 87

## 2024-10-02 DIAGNOSIS — Z9889 Other specified postprocedural states: Secondary | ICD-10-CM

## 2024-10-02 NOTE — Progress Notes (Signed)
  HPI: Patient returns for routine postoperative follow-up having undergone RA TLH + BS 08/09/24.  The patient's immediate postoperative recovery has been unremarkable. Since hospital discharge the patient reports no problems.   Current Outpatient Medications: dicyclomine  (BENTYL ) 20 MG tablet, Take 1 tablet (20 mg total) by mouth 3 (three) times daily as needed for spasms (abd cramping)., Disp: 60 tablet, Rfl: 1 hydrochlorothiazide  (HYDRODIURIL ) 25 MG tablet, Take 1 tablet (25 mg total) by mouth daily., Disp: 90 tablet, Rfl: 0 levothyroxine  (SYNTHROID ) 100 MCG tablet, Take 1 tablet (100 mcg total) by mouth every morning., Disp: 90 tablet, Rfl: 0 oxyCODONE -acetaminophen  (PERCOCET) 7.5-325 MG tablet, Take 1 tablet by mouth every 6 (six) hours as needed for severe pain (pain score 7-10)., Disp: 28 tablet, Rfl: 0 rosuvastatin (CRESTOR) 5 MG tablet, Take 5 mg by mouth at bedtime., Disp: , Rfl:  amLODipine  (NORVASC ) 2.5 MG tablet, Take 1 tablet (2.5 mg total) by mouth daily. (Patient not taking: Reported on 08/17/2024), Disp: 90 tablet, Rfl: 1 baclofen  (LIORESAL ) 10 MG tablet, Take 1 tablet (10 mg total) by mouth 3 (three) times daily. For headaches (Patient not taking: Reported on 08/17/2024), Disp: 30 each, Rfl: 0 ketorolac  (TORADOL ) 10 MG tablet, Take 1 tablet (10 mg total) by mouth every 8 (eight) hours as needed. (Patient not taking: Reported on 08/17/2024), Disp: 15 tablet, Rfl: 0 ketorolac  (TORADOL ) 10 MG tablet, Take 1 tablet (10 mg total) by mouth every 8 (eight) hours as needed. (Patient not taking: Reported on 08/17/2024), Disp: 15 tablet, Rfl: 0 ondansetron  (ZOFRAN -ODT) 8 MG disintegrating tablet, Take 1 tablet (8 mg total) by mouth every 8 (eight) hours as needed for nausea or vomiting. (Patient not taking: Reported on 08/17/2024), Disp: 8 tablet, Rfl: 0 pantoprazole  (PROTONIX ) 40 MG tablet, Take 1 tablet (40 mg total) by mouth 2 (two) times daily. (Patient not taking: Reported on 08/17/2024), Disp: 60  tablet, Rfl: 0  No current facility-administered medications for this visit.    Blood pressure 138/89, pulse 84, last menstrual period 08/04/2024.  Physical Exam: 5 incisions dry and healing well Abdomen is benign Cuff intact non tender  Diagnostic Tests:   Pathology: Benign IUD in uterus  Impression + Management plan:   ICD-10-CM   1. Post-operative state: RA TLH + BS 08/09/24  Z98.890            Medications Prescribed this encounter: No orders of the defined types were placed in this encounter.     Follow up: Return in about 3 years (around 10/03/2027).    Vonn VEAR Inch, MD Attending Physician for the [facility] for [facility] 08/17/2024 12:01 PM
